# Patient Record
Sex: Female | Born: 1935 | Race: White | Hispanic: No | Smoking: Never smoker
Health system: Southern US, Community
[De-identification: ages and names within clinical notes are randomized; demographics above are authoritative.]

## PROBLEM LIST (undated history)

## (undated) DIAGNOSIS — E663 Overweight: Secondary | ICD-10-CM

## (undated) DIAGNOSIS — I495 Sick sinus syndrome: Secondary | ICD-10-CM

## (undated) DIAGNOSIS — M1990 Unspecified osteoarthritis, unspecified site: Secondary | ICD-10-CM

## (undated) DIAGNOSIS — I5032 Chronic diastolic (congestive) heart failure: Secondary | ICD-10-CM

## (undated) DIAGNOSIS — K5730 Diverticulosis of large intestine without perforation or abscess without bleeding: Secondary | ICD-10-CM

## (undated) DIAGNOSIS — I4819 Other persistent atrial fibrillation: Secondary | ICD-10-CM

## (undated) DIAGNOSIS — D126 Benign neoplasm of colon, unspecified: Secondary | ICD-10-CM

## (undated) DIAGNOSIS — M949 Disorder of cartilage, unspecified: Secondary | ICD-10-CM

## (undated) DIAGNOSIS — F329 Major depressive disorder, single episode, unspecified: Secondary | ICD-10-CM

## (undated) DIAGNOSIS — I872 Venous insufficiency (chronic) (peripheral): Secondary | ICD-10-CM

## (undated) DIAGNOSIS — N289 Disorder of kidney and ureter, unspecified: Secondary | ICD-10-CM

## (undated) DIAGNOSIS — E785 Hyperlipidemia, unspecified: Secondary | ICD-10-CM

## (undated) DIAGNOSIS — M899 Disorder of bone, unspecified: Secondary | ICD-10-CM

## (undated) DIAGNOSIS — E039 Hypothyroidism, unspecified: Secondary | ICD-10-CM

## (undated) DIAGNOSIS — I4892 Unspecified atrial flutter: Secondary | ICD-10-CM

## (undated) DIAGNOSIS — F3289 Other specified depressive episodes: Secondary | ICD-10-CM

## (undated) HISTORY — DX: Hyperlipidemia, unspecified: E78.5

## (undated) HISTORY — DX: Diverticulosis of large intestine without perforation or abscess without bleeding: K57.30

## (undated) HISTORY — DX: Disorder of bone, unspecified: M89.9

## (undated) HISTORY — DX: Benign neoplasm of colon, unspecified: D12.6

## (undated) HISTORY — DX: Overweight: E66.3

## (undated) HISTORY — DX: Hypothyroidism, unspecified: E03.9

## (undated) HISTORY — DX: Unspecified atrial flutter: I48.92

## (undated) HISTORY — DX: Disorder of cartilage, unspecified: M94.9

## (undated) HISTORY — DX: Major depressive disorder, single episode, unspecified: F32.9

## (undated) HISTORY — DX: Unspecified osteoarthritis, unspecified site: M19.90

## (undated) HISTORY — DX: Sick sinus syndrome: I49.5

## (undated) HISTORY — DX: Other persistent atrial fibrillation: I48.19

## (undated) HISTORY — PX: TONSILLECTOMY: SUR1361

## (undated) HISTORY — DX: Other specified depressive episodes: F32.89

---

## 1998-01-18 ENCOUNTER — Other Ambulatory Visit: Admission: RE | Admit: 1998-01-18 | Discharge: 1998-01-18 | Payer: Self-pay | Admitting: Obstetrics and Gynecology

## 1999-11-13 ENCOUNTER — Other Ambulatory Visit: Admission: RE | Admit: 1999-11-13 | Discharge: 1999-11-13 | Payer: Self-pay | Admitting: *Deleted

## 2001-02-25 ENCOUNTER — Other Ambulatory Visit: Admission: RE | Admit: 2001-02-25 | Discharge: 2001-02-25 | Payer: Self-pay | Admitting: *Deleted

## 2001-10-17 ENCOUNTER — Emergency Department (HOSPITAL_COMMUNITY): Admission: EM | Admit: 2001-10-17 | Discharge: 2001-10-17 | Payer: Self-pay

## 2001-10-17 ENCOUNTER — Encounter: Payer: Self-pay | Admitting: Emergency Medicine

## 2002-01-12 ENCOUNTER — Encounter: Payer: Self-pay | Admitting: Pulmonary Disease

## 2002-01-12 ENCOUNTER — Inpatient Hospital Stay (HOSPITAL_COMMUNITY): Admission: EM | Admit: 2002-01-12 | Discharge: 2002-01-13 | Payer: Self-pay | Admitting: Emergency Medicine

## 2002-01-13 ENCOUNTER — Inpatient Hospital Stay (HOSPITAL_COMMUNITY): Admission: EM | Admit: 2002-01-13 | Discharge: 2002-01-25 | Payer: Self-pay | Admitting: Psychiatry

## 2002-01-15 ENCOUNTER — Encounter: Payer: Self-pay | Admitting: Psychiatry

## 2002-01-18 ENCOUNTER — Encounter: Payer: Self-pay | Admitting: Psychiatry

## 2002-01-21 ENCOUNTER — Encounter: Payer: Self-pay | Admitting: *Deleted

## 2002-02-26 ENCOUNTER — Ambulatory Visit (HOSPITAL_COMMUNITY): Admission: RE | Admit: 2002-02-26 | Discharge: 2002-02-26 | Payer: Self-pay | Admitting: Neurology

## 2005-02-08 ENCOUNTER — Ambulatory Visit: Payer: Self-pay | Admitting: Pulmonary Disease

## 2005-03-06 ENCOUNTER — Ambulatory Visit: Payer: Self-pay | Admitting: Pulmonary Disease

## 2006-03-10 ENCOUNTER — Ambulatory Visit: Payer: Self-pay | Admitting: Pulmonary Disease

## 2006-08-04 ENCOUNTER — Ambulatory Visit: Payer: Self-pay | Admitting: Pulmonary Disease

## 2006-08-04 LAB — CONVERTED CEMR LAB
ALT: 23 units/L (ref 0–40)
AST: 23 units/L (ref 0–37)
Albumin: 3.6 g/dL (ref 3.5–5.2)
Alkaline Phosphatase: 48 units/L (ref 39–117)
BUN: 14 mg/dL (ref 6–23)
CO2: 30 meq/L (ref 19–32)
Calcium: 9.3 mg/dL (ref 8.4–10.5)
Chloride: 106 meq/L (ref 96–112)
Creatinine, Ser: 1.2 mg/dL (ref 0.4–1.2)
GFR calc non Af Amer: 47 mL/min
Glomerular Filtration Rate, Af Am: 57 mL/min/{1.73_m2}
Glucose, Bld: 107 mg/dL — ABNORMAL HIGH (ref 70–99)
Potassium: 4.7 meq/L (ref 3.5–5.1)
Sodium: 142 meq/L (ref 135–145)
Total Bilirubin: 0.6 mg/dL (ref 0.3–1.2)
Total Protein: 6.8 g/dL (ref 6.0–8.3)

## 2006-08-18 ENCOUNTER — Ambulatory Visit: Payer: Self-pay | Admitting: Pulmonary Disease

## 2006-08-18 LAB — CONVERTED CEMR LAB
Chol/HDL Ratio, serum: 2.7
Cholesterol: 140 mg/dL (ref 0–200)
HDL: 51.6 mg/dL (ref >39.0)
LDL Cholesterol: 70 mg/dL (ref 0–99)
Triglyceride fasting, serum: 90 mg/dL (ref 0–149)
VLDL: 18 mg/dL (ref 0–40)

## 2006-12-01 ENCOUNTER — Ambulatory Visit: Payer: Self-pay | Admitting: Pulmonary Disease

## 2006-12-22 ENCOUNTER — Ambulatory Visit: Payer: Self-pay | Admitting: Pulmonary Disease

## 2007-01-09 ENCOUNTER — Ambulatory Visit: Payer: Self-pay | Admitting: Pulmonary Disease

## 2007-01-09 LAB — CONVERTED CEMR LAB
ALT: 26 units/L (ref 0–40)
AST: 21 units/L (ref 0–37)
Albumin: 3.6 g/dL (ref 3.5–5.2)
Alkaline Phosphatase: 64 units/L (ref 39–117)
BUN: 19 mg/dL (ref 6–23)
Basophils Absolute: 0.1 10*3/uL (ref 0.0–0.1)
Basophils Relative: 0.9 % (ref 0.0–1.0)
Bilirubin Urine: NEGATIVE
Bilirubin, Direct: 0.1 mg/dL (ref 0.0–0.3)
CO2: 30 meq/L (ref 19–32)
Calcium: 9.5 mg/dL (ref 8.4–10.5)
Chloride: 106 meq/L (ref 96–112)
Creatinine, Ser: 1.2 mg/dL (ref 0.4–1.2)
Crystals: NEGATIVE
Eosinophils Absolute: 0.1 10*3/uL (ref 0.0–0.6)
Eosinophils Relative: 1.2 % (ref 0.0–5.0)
Free T4: 0.7 ng/dL (ref 0.6–1.6)
GFR calc Af Amer: 57 mL/min
GFR calc non Af Amer: 47 mL/min
Glucose, Bld: 143 mg/dL — ABNORMAL HIGH (ref 70–99)
HCT: 43 % (ref 36.0–46.0)
Hemoglobin, Urine: NEGATIVE
Hemoglobin: 14.7 g/dL (ref 12.0–15.0)
Ketones, ur: NEGATIVE mg/dL
Lymphocytes Relative: 24.4 % (ref 12.0–46.0)
MCHC: 34.1 g/dL (ref 30.0–36.0)
MCV: 87.7 fL (ref 78.0–100.0)
Monocytes Absolute: 0.3 10*3/uL (ref 0.2–0.7)
Monocytes Relative: 4.7 % (ref 3.0–11.0)
Mucus, UA: NEGATIVE
Neutro Abs: 4.4 10*3/uL (ref 1.4–7.7)
Neutrophils Relative %: 68.8 % (ref 43.0–77.0)
Nitrite: NEGATIVE
Platelets: 239 10*3/uL (ref 150–400)
Potassium: 4.3 meq/L (ref 3.5–5.1)
RBC / HPF: NONE SEEN
RBC: 4.91 M/uL (ref 3.87–5.11)
RDW: 14.3 % (ref 11.5–14.6)
Sodium: 141 meq/L (ref 135–145)
Specific Gravity, Urine: 1.01 (ref 1.000–1.03)
T3, Free: 2.6 pg/mL (ref 2.3–4.2)
T4, Total: 6.4 ug/dL (ref 5.0–12.5)
Total Bilirubin: 0.7 mg/dL (ref 0.3–1.2)
Total Protein, Urine: NEGATIVE mg/dL
Total Protein: 6.4 g/dL (ref 6.0–8.3)
Urine Glucose: NEGATIVE mg/dL
Urobilinogen, UA: 0.2 (ref 0.0–1.0)
WBC: 6.5 10*3/uL (ref 4.5–10.5)
pH: 7 (ref 5.0–8.0)

## 2007-01-22 ENCOUNTER — Inpatient Hospital Stay (HOSPITAL_COMMUNITY): Admission: AD | Admit: 2007-01-22 | Discharge: 2007-01-24 | Payer: Self-pay | Admitting: Psychiatry

## 2007-01-22 ENCOUNTER — Ambulatory Visit: Payer: Self-pay | Admitting: Psychiatry

## 2007-04-21 ENCOUNTER — Ambulatory Visit: Payer: Self-pay | Admitting: Pulmonary Disease

## 2007-04-21 LAB — CONVERTED CEMR LAB
ALT: 31 units/L (ref 0–35)
AST: 30 units/L (ref 0–37)
Albumin: 3.7 g/dL (ref 3.5–5.2)
Alkaline Phosphatase: 59 units/L (ref 39–117)
BUN: 15 mg/dL (ref 6–23)
Basophils Absolute: 0 10*3/uL (ref 0.0–0.1)
Basophils Relative: 0.5 % (ref 0.0–1.0)
Bilirubin, Direct: 0.1 mg/dL (ref 0.0–0.3)
CO2: 28 meq/L (ref 19–32)
Calcium: 9.8 mg/dL (ref 8.4–10.5)
Chloride: 103 meq/L (ref 96–112)
Creatinine, Ser: 1.3 mg/dL — ABNORMAL HIGH (ref 0.4–1.2)
Eosinophils Absolute: 0.1 10*3/uL (ref 0.0–0.6)
Eosinophils Relative: 2.1 % (ref 0.0–5.0)
GFR calc Af Amer: 51 mL/min
GFR calc non Af Amer: 43 mL/min
Glucose, Bld: 97 mg/dL (ref 70–99)
HCT: 39.7 % (ref 36.0–46.0)
Hemoglobin: 13.7 g/dL (ref 12.0–15.0)
Lymphocytes Relative: 37.6 % (ref 12.0–46.0)
MCHC: 34.6 g/dL (ref 30.0–36.0)
MCV: 90.9 fL (ref 78.0–100.0)
Monocytes Absolute: 0.4 10*3/uL (ref 0.2–0.7)
Monocytes Relative: 7.2 % (ref 3.0–11.0)
Neutro Abs: 2.6 10*3/uL (ref 1.4–7.7)
Neutrophils Relative %: 52.6 % (ref 43.0–77.0)
Platelets: 241 10*3/uL (ref 150–400)
Potassium: 4.4 meq/L (ref 3.5–5.1)
RBC: 4.37 M/uL (ref 3.87–5.11)
RDW: 13.1 % (ref 11.5–14.6)
Sodium: 137 meq/L (ref 135–145)
TSH: 3.56 microintl units/mL (ref 0.35–5.50)
Total Bilirubin: 0.7 mg/dL (ref 0.3–1.2)
Total Protein: 6.4 g/dL (ref 6.0–8.3)
WBC: 4.9 10*3/uL (ref 4.5–10.5)

## 2007-06-12 ENCOUNTER — Ambulatory Visit: Payer: Self-pay | Admitting: Pulmonary Disease

## 2007-12-30 ENCOUNTER — Telehealth (INDEPENDENT_AMBULATORY_CARE_PROVIDER_SITE_OTHER): Payer: Self-pay | Admitting: *Deleted

## 2007-12-31 ENCOUNTER — Ambulatory Visit: Payer: Self-pay | Admitting: Pulmonary Disease

## 2007-12-31 DIAGNOSIS — E039 Hypothyroidism, unspecified: Secondary | ICD-10-CM | POA: Insufficient documentation

## 2007-12-31 DIAGNOSIS — E785 Hyperlipidemia, unspecified: Secondary | ICD-10-CM

## 2007-12-31 DIAGNOSIS — F32A Depression, unspecified: Secondary | ICD-10-CM | POA: Insufficient documentation

## 2007-12-31 DIAGNOSIS — F329 Major depressive disorder, single episode, unspecified: Secondary | ICD-10-CM

## 2007-12-31 DIAGNOSIS — M1990 Unspecified osteoarthritis, unspecified site: Secondary | ICD-10-CM | POA: Insufficient documentation

## 2007-12-31 DIAGNOSIS — J209 Acute bronchitis, unspecified: Secondary | ICD-10-CM

## 2008-01-23 ENCOUNTER — Encounter: Payer: Self-pay | Admitting: Pulmonary Disease

## 2008-02-02 ENCOUNTER — Ambulatory Visit: Payer: Self-pay | Admitting: Pulmonary Disease

## 2008-02-02 DIAGNOSIS — I872 Venous insufficiency (chronic) (peripheral): Secondary | ICD-10-CM | POA: Insufficient documentation

## 2008-02-02 DIAGNOSIS — I679 Cerebrovascular disease, unspecified: Secondary | ICD-10-CM | POA: Insufficient documentation

## 2008-02-02 DIAGNOSIS — M949 Disorder of cartilage, unspecified: Secondary | ICD-10-CM

## 2008-02-02 DIAGNOSIS — E663 Overweight: Secondary | ICD-10-CM | POA: Insufficient documentation

## 2008-02-02 DIAGNOSIS — M899 Disorder of bone, unspecified: Secondary | ICD-10-CM | POA: Insufficient documentation

## 2008-02-07 LAB — CONVERTED CEMR LAB
AST: 21 units/L (ref 0–37)
Albumin: 3.8 g/dL (ref 3.5–5.2)
Alkaline Phosphatase: 61 units/L (ref 39–117)
BUN: 17 mg/dL (ref 6–23)
Bilirubin, Direct: 0.1 mg/dL (ref 0.0–0.3)
Chloride: 109 meq/L (ref 96–112)
Eosinophils Absolute: 0.1 10*3/uL (ref 0.0–0.7)
Eosinophils Relative: 1.5 % (ref 0.0–5.0)
GFR calc non Af Amer: 42 mL/min
HDL: 50.5 mg/dL (ref >39.0)
MCV: 92.1 fL (ref 78.0–100.0)
Monocytes Relative: 5.9 % (ref 3.0–12.0)
Neutrophils Relative %: 49.2 % (ref 43.0–77.0)
Platelets: 211 10*3/uL (ref 150–400)
Potassium: 4.5 meq/L (ref 3.5–5.1)
Sodium: 143 meq/L (ref 135–145)
Total CHOL/HDL Ratio: 3.6
VLDL: 21 mg/dL (ref 0–40)
WBC: 5.5 10*3/uL (ref 4.5–10.5)

## 2008-02-10 ENCOUNTER — Telehealth (INDEPENDENT_AMBULATORY_CARE_PROVIDER_SITE_OTHER): Payer: Self-pay | Admitting: *Deleted

## 2008-08-22 ENCOUNTER — Ambulatory Visit: Payer: Self-pay | Admitting: Pulmonary Disease

## 2009-06-01 ENCOUNTER — Ambulatory Visit: Payer: Self-pay | Admitting: Pulmonary Disease

## 2009-06-01 DIAGNOSIS — K5900 Constipation, unspecified: Secondary | ICD-10-CM | POA: Insufficient documentation

## 2009-06-01 LAB — CONVERTED CEMR LAB
ALT: 23 units/L (ref 0–35)
Alkaline Phosphatase: 63 units/L (ref 39–117)
BUN: 19 mg/dL (ref 6–23)
Basophils Relative: 1.1 % (ref 0.0–3.0)
Bilirubin, Direct: 0.1 mg/dL (ref 0.0–0.3)
Calcium: 9.5 mg/dL (ref 8.4–10.5)
Chloride: 107 meq/L (ref 96–112)
Creatinine, Ser: 1.3 mg/dL — ABNORMAL HIGH (ref 0.4–1.2)
Eosinophils Relative: 1.8 % (ref 0.0–5.0)
GFR calc non Af Amer: 42.29 mL/min (ref >60)
HDL: 62.7 mg/dL (ref >39.00)
LDL Cholesterol: 86 mg/dL (ref 0–99)
Lymphocytes Relative: 34.8 % (ref 12.0–46.0)
Monocytes Relative: 8.2 % (ref 3.0–12.0)
Neutrophils Relative %: 54.1 % (ref 43.0–77.0)
Platelets: 172 10*3/uL (ref 150.0–400.0)
RBC: 4.69 M/uL (ref 3.87–5.11)
Total Bilirubin: 0.7 mg/dL (ref 0.3–1.2)
Total CHOL/HDL Ratio: 3
Total Protein: 6.8 g/dL (ref 6.0–8.3)
Triglycerides: 129 mg/dL (ref 0.0–149.0)
VLDL: 25.8 mg/dL (ref 0.0–40.0)
WBC: 4.9 10*3/uL (ref 4.5–10.5)

## 2009-06-14 ENCOUNTER — Telehealth: Payer: Self-pay | Admitting: Pulmonary Disease

## 2009-06-20 ENCOUNTER — Ambulatory Visit: Payer: Self-pay | Admitting: Gastroenterology

## 2009-07-18 ENCOUNTER — Encounter: Payer: Self-pay | Admitting: Gastroenterology

## 2009-07-18 ENCOUNTER — Ambulatory Visit: Payer: Self-pay | Admitting: Gastroenterology

## 2009-07-19 ENCOUNTER — Encounter: Payer: Self-pay | Admitting: Gastroenterology

## 2009-10-13 ENCOUNTER — Telehealth: Payer: Self-pay | Admitting: Pulmonary Disease

## 2009-11-24 ENCOUNTER — Encounter: Payer: Self-pay | Admitting: Pulmonary Disease

## 2009-12-08 ENCOUNTER — Ambulatory Visit: Payer: Self-pay | Admitting: Pulmonary Disease

## 2009-12-08 LAB — CONVERTED CEMR LAB
AST: 23 units/L (ref 0–37)
Albumin: 3.8 g/dL (ref 3.5–5.2)
Alkaline Phosphatase: 61 units/L (ref 39–117)
BUN: 18 mg/dL (ref 6–23)
CO2: 27 meq/L (ref 19–32)
Calcium: 9.7 mg/dL (ref 8.4–10.5)
GFR calc non Af Amer: 42.23 mL/min (ref >60)
Glucose, Bld: 95 mg/dL (ref 70–99)
Sodium: 140 meq/L (ref 135–145)
TSH: 3.13 microintl units/mL (ref 0.35–5.50)
Total Protein: 6.8 g/dL (ref 6.0–8.3)

## 2010-08-03 ENCOUNTER — Ambulatory Visit: Payer: Self-pay | Admitting: Pulmonary Disease

## 2010-08-03 ENCOUNTER — Encounter: Payer: Self-pay | Admitting: Pulmonary Disease

## 2010-08-03 DIAGNOSIS — I4891 Unspecified atrial fibrillation: Secondary | ICD-10-CM | POA: Insufficient documentation

## 2010-08-03 DIAGNOSIS — D126 Benign neoplasm of colon, unspecified: Secondary | ICD-10-CM | POA: Insufficient documentation

## 2010-08-03 DIAGNOSIS — K5730 Diverticulosis of large intestine without perforation or abscess without bleeding: Secondary | ICD-10-CM | POA: Insufficient documentation

## 2010-08-05 LAB — CONVERTED CEMR LAB
AST: 19 units/L (ref 0–37)
Albumin: 3.8 g/dL (ref 3.5–5.2)
Alkaline Phosphatase: 73 units/L (ref 39–117)
Basophils Absolute: 0 10*3/uL (ref 0.0–0.1)
Bilirubin, Direct: 0.1 mg/dL (ref 0.0–0.3)
CO2: 29 meq/L (ref 19–32)
Calcium: 9.7 mg/dL (ref 8.4–10.5)
Creatinine, Ser: 1.4 mg/dL — ABNORMAL HIGH (ref 0.4–1.2)
Eosinophils Absolute: 0.1 10*3/uL (ref 0.0–0.7)
GFR calc non Af Amer: 40.02 mL/min (ref >60)
Glucose, Bld: 99 mg/dL (ref 70–99)
Lymphocytes Relative: 38.6 % (ref 12.0–46.0)
MCHC: 33.8 g/dL (ref 30.0–36.0)
Monocytes Relative: 7.5 % (ref 3.0–12.0)
Neutrophils Relative %: 52 % (ref 43.0–77.0)
Platelets: 220 10*3/uL (ref 150.0–400.0)
RDW: 13.9 % (ref 11.5–14.6)
Sodium: 140 meq/L (ref 135–145)
Total Bilirubin: 0.4 mg/dL (ref 0.3–1.2)

## 2010-08-06 ENCOUNTER — Ambulatory Visit: Payer: Self-pay | Admitting: Internal Medicine

## 2010-08-08 ENCOUNTER — Encounter: Payer: Self-pay | Admitting: Internal Medicine

## 2010-09-07 ENCOUNTER — Ambulatory Visit: Payer: Self-pay

## 2010-09-07 ENCOUNTER — Encounter: Payer: Self-pay | Admitting: Internal Medicine

## 2010-09-07 ENCOUNTER — Ambulatory Visit (HOSPITAL_COMMUNITY)
Admission: RE | Admit: 2010-09-07 | Discharge: 2010-09-07 | Payer: Self-pay | Source: Home / Self Care | Attending: Internal Medicine | Admitting: Internal Medicine

## 2010-09-10 ENCOUNTER — Telehealth: Payer: Self-pay | Admitting: Pulmonary Disease

## 2010-09-21 ENCOUNTER — Ambulatory Visit: Payer: Self-pay | Admitting: Internal Medicine

## 2010-10-05 ENCOUNTER — Emergency Department (HOSPITAL_COMMUNITY)
Admission: EM | Admit: 2010-10-05 | Discharge: 2010-10-05 | Payer: Self-pay | Source: Home / Self Care | Admitting: Emergency Medicine

## 2010-10-30 NOTE — Progress Notes (Signed)
Summary: COUGH/ CONGESTION  Phone Note Call from Patient Call back at [phone]   Caller: Daughter Call For: Sonya Kaufman Summary of Call: COUGH/ CONGESTION T[place] Initial call taken by: Gustavus Bryant,  October 13, 2009 9:40 AM  Follow-up for Phone Call        called, spoke with pt.  P c/o cough with yellowish green mucus, chest congestion, rattling, and wheezing x3 wks.  denies fever and chest tightness.  Requesting abx to be sent in.  Will forward to SN-please advise.   NKDA [facility] Follow-up by: Raymondo Band RN,  October 13, 2009 1:44 PM  Additional Follow-up for Phone Call Additional follow up Details #1::        per SN---ok for pt to have zpak and medrol dosepak  these have been sent to pharmacy and i called and spoke with pt    New/Updated Medications: ZITHROMAX Z-PAK 250 MG TABS (AZITHROMYCIN) take as directed MEDROL (PAK) 4 MG TABS (METHYLPREDNISOLONE) take as directed Prescriptions: MEDROL (PAK) 4 MG TABS (METHYLPREDNISOLONE) take as directed  #1 x 0   Entered by:   [name] CMA   Authorized by:   Noralee Space MD   Signed by:   [name] CMA on 10/13/2009   Method used:   Electronically to        [facility]  [phone]* (retail)       [address]       [place], [place]  [address]       Ph: [phone] or [phone]       Fax: [phone]   RxIDDP:[id number] ZITHROMAX Z-PAK 250 MG TABS (AZITHROMYCIN) take as directed  #1 pak x 0   Entered by:   [name] CMA   Authorized by:   Noralee Space MD   Signed by:   [name] CMA on 10/13/2009   Method used:   Electronically to        [facility]  [phone]* (retail)       [address]       [place], [place]  [address]       Ph: [phone] or [phone]       Fax: [phone]   RxID:   [id number]

## 2010-10-30 NOTE — Assessment & Plan Note (Signed)
Summary: nep/ new onset of afib/ asap. pt has secure horizone. gd  Medications Added FISH OIL 1000 MG CAPS (OMEGA-3 FATTY ACIDS) take 2 cap sby mouth once daily... METOPROLOL SUCCINATE 25 MG XR24H-TAB (METOPROLOL SUCCINATE) take 2 tab by mouth once daily... PRADAXA 150 MG CAPS (DABIGATRAN ETEXILATE MESYLATE) one by mouth two times a day METOPROLOL SUCCINATE 50 MG XR24H-TAB (METOPROLOL SUCCINATE) one by mouth daily      Allergies Added: NKDA  Visit Type:  Initial Consult Referring Provider:  Dr Lenna Gilford Primary Provider:  scott nadel,md   History of Present Illness: Ms Sonya Kaufman is a pleasant 77 yo WF with a depression, asthma, and h/o newly diagnosed atrial fibrillation who presents today for EP consultation regarding her afib.  She presented to Dr Jeannine Kitten office 08/03/10 for routine follow-up and was found to have atrial fibrillation with HR 120s.  She was started on toprol XL 25mg  daily.  She reports intermittent SOB for more than 6 months.  She is dypsneic with moderate activity.  She denies CP, palpitations, orthopnea, edema, presyncope, or syncope.  She fell 3 weeks ago while getting out of the bed.  She denies residual complications following this fall.  She did not have LOC.  She denies other falls.   Current Medications (verified): 1)  Proair Hfa 108 (90 Base) Mcg/act  Aers (Albuterol Sulfate) .... Inhale 2 Puffs Every 6 Hours As Needed For Wheezing... 2)  Aspirin 81 Mg Tabs (Aspirin) .... Take 1 Tablet By Mouth Once A Day 3)  Simvastatin 20 Mg  Tabs (Simvastatin) .... Take 1 Tab By Mouth At Bedtime.Marland KitchenMarland Kitchen 4)  Fish Oil 1000 Mg Caps (Omega-3 Fatty Acids) .... Take 2 Cap Sby Mouth Once Daily.Marland KitchenMarland Kitchen 5)  Synthroid 125 Mcg Tabs (Levothyroxine Sodium) .... Take 1 Tab By Mouth Once Daily.Marland KitchenMarland Kitchen 6)  Miralax   Powd (Polyethylene Glycol 3350) .... Take 1 Capful Mixed in Water Two Times A Day... 7)  Wellbutrin Xl 150 Mg  Tb24 (Bupropion Hcl) .... As Directed By Drsteiner... 8)  Lexapro 20 Mg  Tabs  (Escitalopram Oxalate) .... As Directed By Drsteiner... 9)  Zyprexa 2.5 Mg  Tabs (Olanzapine) .... As Directed By Drsteiner... 10)  Lithobid 300 Mg Cr-Tabs (Lithium Carbonate) .... Take 2 Capsules By Mouth Once Daily 11)  Deplin 15 Mg Tabs (L-Methylfolate) .... Take 1 Tab Daily As Directed By Drsteiner... 12)  Multivitamins  Tabs (Multiple Vitamin) .... Take 1 Tablet By Mouth Once A Day 13)  Vitamin D 1000 Unit Tabs (Cholecalciferol) .... Take 1 Cap By Mouth Once Daily... 14)  Metoprolol Succinate 25 Mg Xr24h-Tab (Metoprolol Succinate) .... Take 1 Tab By Mouth Once Daily...  Allergies (verified): No Known Drug Allergies  Past History:  Past Medical History: ASTHMATIC BRONCHITIS, ACUTE (ICD-466.0) ATRIAL FIBRILLATION dx 08/03/10 CEREBROVASCULAR DISEASE (ICD-437.9) VENOUS INSUFFICIENCY (ICD-459.81) HYPERLIPIDEMIA (ICD-272.4) HYPOTHYROIDISM (ICD-244.9) OVERWEIGHT (ICD-278.02) DIVERTICULOSIS OF COLON (ICD-562.10) COLONIC POLYPS (ICD-211.3) OSTEOARTHRITIS (ICD-715.90) OSTEOPENIA (ICD-733.90) DEPRESSION (ICD-311)  Past Surgical History: tonsellectomy  Family History: Reviewed history from 08/03/2010 and no changes required. mother deceased age 56  from CHF  hx of arthritis father deceased age 25 heart problems 1 sibling alive age 57 with heart problems  Social History: married and lives in [place] with spouse 4 children never smoked caffeine use  1 daily no alcohol use  Review of Systems       All systems are reviewed and negative except as listed in the HPI.   Vital Signs:  Patient profile:   77 year old female Height:  68 inches Weight:      208 pounds BMI:     31.74 Pulse rate:   104 / minute BP sitting:   142 / 78  (left arm)  Vitals Entered By: Margaretmary Bayley CMA (August 06, 2010 12:10 PM)  Physical Exam  General:  Well developed, well nourished, in no acute distress. Head:  normocephalic and atraumatic Eyes:  PERRLA/EOM intact; conjunctiva and lids  normal. Mouth:  Teeth, gums and palate normal. Oral mucosa normal. Neck:  supple Lungs:  Clear bilaterally to auscultation and percussion. Heart:  iRRR, no m/r/g Abdomen:  Bowel sounds positive; abdomen soft and non-tender without masses, organomegaly, or hernias noted. No hepatosplenomegaly. Msk:  Back normal, normal gait. Muscle strength and tone normal. Pulses:  pulses normal in all 4 extremities Extremities:  No clubbing or cyanosis. Neurologic:  Alert and oriented x 3. Skin:  Intact without lesions or rashes. Psych:  Normal affect.   EKG  Procedure date:  08/03/2010  Findings:      afib,  V rate 114 bpm, Qtc 422 otherwise normal ekg  Impression & Recommendations:  Problem # 1:  ATRIAL FIBRILLATION (ICD-427.31) The patient presents today for EP consultation regarding mildly symptomatic recently diagnosed persistent atrial fibrillation.  She has been initiated on metoprolol for rate control, however her ventricular rates remains elevated.  Her CHADS2 score is 1 (age >12).  I have therefore recommended chronic anticoagulation with either coumadin or pradaxa.  She states that making frequent visits for INR checks would be difficult and therefore prefers pradaxa.  Her CrCl is 50.  We will therefore initiate pradaxa 150mg  two times a day at this time. In addition, I will increase metoprolol to 50mg  daily.  I will see her back in several weeks to further assess rate control. Recent TFTs are normal.  We will obtain an echocardiogram in the interim.  Other Orders: Echocardiogram (Echo)  Patient Instructions: 1)  Your physician recommends that you schedule a follow-up appointment in: 4 weeks with Dr Rayann Heman 2)  Your physician has recommended you make the following change in your medication: increase Metoprolol to 50mg   a day, and start Pradaxa 150mg  two times a day 3)  Your physician has requested that you have an echocardiogram.  Echocardiography is a painless test that uses sound waves  to create images of your heart. It provides your doctor with information about the size and shape of your heart and how well your heart's chambers and valves are working.  This procedure takes approximately one hour. There are no restrictions for this procedure.Prior to appointment in 4 weeks Prescriptions: METOPROLOL SUCCINATE 50 MG XR24H-TAB (METOPROLOL SUCCINATE) one by mouth daily  #30 x 11   Entered by:   Janan Halter, RN, BSN   Authorized by:   Thompson Grayer, MD   Signed by:   Janan Halter, RN, BSN on 08/06/2010   Method used:   Electronically to        [facility]  [phone]* (retail)       [address]       [place], [place]  [address]       Ph: [phone] or YT:[phone]       Fax: [phone]   RxIDOR:[id number] PRADAXA 150 MG CAPS (DABIGATRAN ETEXILATE MESYLATE) one by mouth two times a day  #60 x 6   Entered by:   Janan Halter, RN, BSN   Authorized by:  Thompson Grayer, MD   Signed by:   Janan Halter, RN, BSN on 08/06/2010   Method used:   Electronically to        [facility]  [phone]* (retail)       [address]       [place], [place]  [address]       Ph: [phone] or YT:[phone]       Fax: [phone]   RxID:   [id number]

## 2010-10-30 NOTE — Assessment & Plan Note (Signed)
Summary: 6 mos rov////ea   CC:  8 month ROV & review of mult medical problems....  History of Present Illness: 77 y/o WF here for a follow up visit... she has mult med problems as noted below... she is followed by DrSteiner for Psychiatry w/ severe depression on 5 diff meds as below...   ~  Sep10:  states she is doing well overall- continues on Wellbutrin, Lexapro, Zyprexa from DrSteiner and doing satis... she notes some constipation despite Miralax & has to use Exlax- she had Bantam but never had a colonoscopy... we discussed Rx w/ Miralax Bid + Senakot-S 2Qhs, and will refer to GI for colon... she also notes some intermittent right hip pain & we will try Mobic for Prn use... due for f/u Fasting labs today.   ~  Mar11:  13mo f/u visit- she's had some worsening depression & meds adjusted per DrSteiner w/ Lithium incr to Bid & Deplin 15mg /d added... she would like full thyroid function tests to be sure that the current dose of Synthroid (133mcg/d) is adeq replacement Rx for her... otherw feeling satis & stable on current meds.  **Note: TFT's WNL but low-end & we decided to incr Synthroid to 128mcg/d...   ~  August 03, 2010:  rourine ROV feeling OK & w/o any new complaints or concerns... exam shows irreg irreg heart w/ AFib on EKG, rate 106 (80-120 range)... daughter indicates strong FamHx AFib in several relatives but Sonya Kaufman hasn't had this before... Thyroid dose was incr to 135mcg/d last OV & we will recheck labs, check CXR, & refer to Cards for 2DEcho & Rx... for now start low dose BBlocker Rx.    Current Problem List:  ASTHMATIC BRONCHITIS, ACUTE (ICD-466.0) - on PROAIR as needed... she is a non-smoker... denies cough, sputum, hemoptysis, worsening dyspnea,  wheezing, chest pains, snoring, daytime hypersomnolence, etc...   ~  CXR 11/11 showed cardiomeg, clear lungs, NAD...  ATRIAL FIBRILLATION (ICD-427.31) - new onset noted 11/11 OV & pt asymptomatic w/o CP, palpit, dizzy, SOB,  etc... SEE ABOVE>  CEREBROVASCULAR DISEASE (ICD-437.9) - Rx ASA 81mg /d... prev eval by DrLove in 2003... CT showed bilat sm vessel ischemic disease...   ~  9/10:  remains stable, asymptomatic, BP= 130/74  VENOUS INSUFFICIENCY (ICD-459.81) - she follows a low sodium diet and uses support hose as needed... no edema noted.  HYPERLIPIDEMIA (ICD-272.4) - on SIMVASTATIN 20mg /d + FishOil 1000mg /d...  ~  [place] 11/07 showed TChol 140, Tg 90, HDL 52, LDL 70...  ~  [place] 5/09 showed TChol 181, TG 105, HDL 51, LDL 110... rec- same med, better diet for now.  ~  FL[place] 9/10 showed TChol 174, TG 129, HDL 63, LDL 86  HYPOTHYROIDISM (ICD-244.9) - on SYNTHROID 157mcg/d...  ~  prev TSH 7/08 was 3.56  ~  labs 5/09 showed TSH= 2.06  ~  labs 9/10 showed TSH= 2.30  ~  labs 3/11 showed TSH= 3.13, FreeT3= 2.4 (2.3-4.2), FreeT4= 0.9 (0.6-1.6)... rec incr to 174mcg/d.  ~  labs 11/11 on Levoth125 showed TSH= 0.92... keep same for now.  OVERWEIGHT (ICD-278.02) - diet + exercise program discussed...  ~  weight 5/09 = 206#  ~  weight 9/10 = 206#  ~  weight 3/11 = 213#  ~  weight 11/11 = 209#  DIVERTICULOSIS OF COLON (ICD-562.10) COLONIC POLYPS (ICD-211.3) - she had routine colonoscopy 10/10 by DrStark showing several 4-35mm polyps & mild divertics- path showed one tubular adenoma & f/u planned 49yrs.  OSTEOARTHRITIS (ICD-715.90) -  hx knee pain in past, now c/o intermiitent right hip pain... she never filled Mobic Rx & uses OTC pain meds Prn.  OSTEOPENIA (ICD-733.90) - I can't find prev BMD in the system... takes Calcium, MVI, Vit D...  ~  labs 5/09 showed Vit D level = 31... rec- 1000 u Vit D OTC supplement.  DEPRESSION (ICD-311) - This has been her major problem over the years... several psyche hospitalizations and one OD admit... followed by DrSteiner every 2-3 months, on several meds & adjusted frequently... currently improved w/ WELLBUTRIN XL 150,  LEXAPRO 20,  ZYPREXA 2.5.Marland KitchenMarland Kitchen  plus LITHOBID incr to 2 tabs daily &  DEPLIN 15mg /d started recently...  ~  9/10:  she reports doing very well on her regimen- stable w/o acute problems.  ~  3/11:  reports incr depressive symptoms- meds adjusted & Deplin added by DrSteiner.  ~  11/11: reports stable on her 5 med regimen... she & husb continue to care for their 12 y/o son w/ CP.   Preventive Screening-Counseling & Management  Alcohol-Tobacco     Smoking Status: never  Allergies (verified): No Known Drug Allergies  Comments:  Nurse/Medical Assistant: The patient's medications and allergies were reviewed with the patient and were updated in the Medication and Allergy Lists.  Past History:  Past Medical History: ASTHMATIC BRONCHITIS, ACUTE (ICD-466.0) ATRIAL FIBRILLATION (ICD-427.31) CEREBROVASCULAR DISEASE (ICD-437.9) VENOUS INSUFFICIENCY (ICD-459.81) HYPERLIPIDEMIA (ICD-272.4) HYPOTHYROIDISM (ICD-244.9) OVERWEIGHT (ICD-278.02) DIVERTICULOSIS OF COLON (ICD-562.10) COLONIC POLYPS (ICD-211.3) OSTEOARTHRITIS (ICD-715.90) OSTEOPENIA (ICD-733.90) DEPRESSION (ICD-311)  Family History: Reviewed history from 12/08/2009 and no changes required. mother deceased age 73  from CHF  hx of arthritis father deceased age 64 heart problems 1 sibling alive age 49 with heart problems  Social History: Reviewed history from 12/08/2009 and no changes required. married 4 children never smoked caffeine use  1 daily no alcohol use  Review of Systems      See HPI       The patient complains of depression.  The patient denies anorexia, fever, weight loss, weight gain, vision loss, decreased hearing, hoarseness, chest pain, syncope, dyspnea on exertion, peripheral edema, prolonged cough, headaches, hemoptysis, abdominal pain, melena, hematochezia, severe indigestion/heartburn, hematuria, incontinence, muscle weakness, suspicious skin lesions, transient blindness, difficulty walking, unusual weight change, abnormal bleeding, enlarged lymph nodes, and angioedema.     Vital Signs:  Patient profile:   77 year old female Height:      68 inches Weight:      208.50 pounds BMI:     31.82 O2 Sat:      95 % on Room air Temp:     97.5 degrees F oral Pulse rate:   110 / minute BP sitting:   118 / 60  (left arm) Cuff size:   large  Vitals Entered By: [name] CMA (August 03, 2010 11:10 AM)  O2 Sat at Rest %:  95 O2 Flow:  Room air CC: 8 month ROV & review of mult medical problems... Is Patient Diabetic? No Pain Assessment Patient in pain? no      Comments meds updated today with pt   Physical Exam  Additional Exam:  WD, Overweight, 77 y/o WF in NAD... she has a flat affect... GENERAL:  Alert & oriented; pleasant & cooperative... HEENT:  [place]/AT, EOM-wnl, PERRLA, EACs-clear, TMs-wnl, NOSE-clear, THROAT-clear & wnl. NECK:  Supple w/ fairROM; no JVD; normal carotid impulses w/o bruits; no thyromegaly or nodules palpated; no lymphadenopathy. CHEST:  Clear to P & A; without wheezes/ rales/ or rhonchi  heard... HEART:  Irreg AFib w/ variable VR; without murmurs/ rubs/ or gallops detected... ABDOMEN:  Obese, soft & nontender; normal bowel sounds; no organomegaly or masses palpated... EXT: without deformities, mild arthritic changes; no varicose veins/ +venous insuffic/ no edema. NEURO:  CN's intact;  no focal neuro deficits... DERM:  No lesions noted; no rash etc...    EKG  Procedure date:  08/03/2010  Findings:      Atrial fibrillation with a controlled ventricular response rate of: 106 (80-120), and minor NSSTTWA...  SN   CXR  Procedure date:  08/03/2010  Findings:      CHEST - 2 VIEW Comparison: Chest 12/01/2006.   Findings: There is cardiomegaly but no pulmonary edema.  Lungs are clear.  No pleural effusion or pneumothorax.  No focal bony abnormality.   IMPRESSION: Cardiomegaly without acute disease.   Read By:  Ramond Dial,  M.D.   MISC. Report  Procedure date:  08/03/2010  Findings:      BMP (METABOL)    Sodium                    140 mEq/L                   135-145   Potassium                 4.7 mEq/L                   3.5-5.1   Chloride                  106 mEq/L                   96-112   Carbon Dioxide            29 mEq/L                    19-32   Glucose                   99 mg/dL                    70-99   BUN                       19 mg/dL                    6-23   Creatinine           [H]  1.4 mg/dL                   0.4-1.2   Calcium                   9.7 mg/dL                   8.4-10.5   GFR                       40.02 mL/min                >60  CBC Platelet w/Diff (CBCD)   White Cell Count          6.3 K/uL                    4.5-10.5   Red Cell Count  4.78 Mil/uL                 3.87-5.11   Hemoglobin           [H]  15.1 g/dL                   12.0-15.0   Hematocrit                44.7 %                      36.0-46.0   MCV                       93.5 fl                     78.0-100.0   Platelet Count            220.0 K/uL                  150.0-400.0   Neutrophil %              52.0 %                      43.0-77.0   Lymphocyte %              38.6 %                      12.0-46.0   Monocyte %                7.5 %                       3.0-12.0   Eosinophils%              1.6 %                       0.0-5.0   Basophils %               0.3 %                       0.0-3.0  Comments:      Hepatic/Liver Function Panel (HEPATIC)   Total Bilirubin           0.4 mg/dL                   0.3-1.2   Direct Bilirubin          0.1 mg/dL                   0.0-0.3   Alkaline Phosphatase      73 U/L                      39-117   AST                       19 U/L                      0-37   ALT                       21 U/L  0-35   Total Protein             6.3 g/dL                    6.0-8.3   Albumin                   3.8 g/dL                    3.5-5.2   TSH (TSH)   FastTSH                   0.92 uIU/mL                 0.35-5.50  B-Type Natiuretic  Peptide (BNPR)  B-Type Natriuetic Peptide                        [H]  231.7 pg/mL                 0.0-100.0   Impression & Recommendations:  Problem # 1:  ATRIAL FIBRILLATION (ICD-427.31) New AFib detected on PE today>  pt is asymptomatic w/o CP, palpit, dizzy, SOB, etc... we discussed AFib & it's treatment, decided to start METOPROLOL ER 25mg /d, and refer to [facility] for 2DEcho & Rx considerations... she knows to avoid caffeine, etc... Her updated medication list for this problem includes:    Aspirin 81 Mg Tabs (Aspirin) .Marland Kitchen... Take 1 tablet by mouth once a day    Metoprolol Succinate 25 Mg Xr24h-tab (Metoprolol succinate) .Marland Kitchen... Take 1 tab by mouth once daily...  Orders: 12 Lead EKG (12 Lead EKG) T-2 View CXR (71020TC) TLB-BMP (Basic Metabolic Panel-BMET) (99991111) TLB-CBC Platelet - w/Differential (85025-CBCD) TLB-Hepatic/Liver Function Pnl (80076-HEPATIC) TLB-TSH (Thyroid Stimulating Hormone) (84443-TSH) TLB-BNP (B-Natriuretic Peptide) (83880-BNPR) Cardiology Referral (Cardiology)  Problem # 2:  ASTHMATIC BRONCHITIS, ACUTE (ICD-466.0) No recent exac etc... may use the Proair Prn. Her updated medication list for this problem includes:    Proair Hfa 108 (90 Base) Mcg/act Aers (Albuterol sulfate) ..... Inhale 2 puffs every 6 hours as needed for wheezing...  Problem # 3:  CEREBROVASCULAR DISEASE (ICD-437.9) Kown small vessel dis & no cerebral ischemic symptoms... continue ASA daily...  Problem # 4:  HYPERLIPIDEMIA (P102836.4) She will continue the low chol/ low fat diet & exercise program... Her updated medication list for this problem includes:    Simvastatin 20 Mg Tabs (Simvastatin) .Marland Kitchen... Take 1 tab by mouth at bedtime...  Problem # 5:  HYPOTHYROIDISM (ICD-244.9) We bumped the syntroid dose from 112 to 125 last OV> TSH today shows TSH= 0.92... keep same. Her updated medication list for this problem includes:    Synthroid 125 Mcg Tabs (Levothyroxine sodium) .Marland Kitchen... Take 1  tab by mouth once daily...  Problem # 6:  OVERWEIGHT (ICD-278.02) Weight is down a few lbs to 209#... we reviewed diet, exercise, wt reduction program...  Problem # 7:  DEPRESSION (ICD-311) Severe problem followed by DrSteiner & treated w/ 5 meds currently>  Wellbutrin, Lexapro, Zyprexa, Lithium, Deplin... Her updated medication list for this problem includes:    Wellbutrin Xl 150 Mg Tb24 (Bupropion hcl) .Marland Kitchen... As directed by drsteiner...    Lexapro 20 Mg Tabs (Escitalopram oxalate) .Marland Kitchen... As directed by drsteiner...  Problem # 8:  OTHER MEDICAL PROBLEMS AS NOTED>>>  Complete Medication List: 1)  Proair Hfa 108 (90 Base) Mcg/act Aers (Albuterol sulfate) .... Inhale 2 puffs every 6 hours as needed for wheezing.Marland KitchenMarland Kitchen  2)  Aspirin 81 Mg Tabs (Aspirin) .... Take 1 tablet by mouth once a day 3)  Simvastatin 20 Mg Tabs (Simvastatin) .... Take 1 tab by mouth at bedtime.Marland KitchenMarland Kitchen 4)  Fish Oil 1000 Mg Caps (Omega-3 fatty acids) .... Take 1 cap by mouth once daily.Marland KitchenMarland Kitchen 5)  Synthroid 125 Mcg Tabs (Levothyroxine sodium) .... Take 1 tab by mouth once daily.Marland KitchenMarland Kitchen 6)  Miralax Powd (Polyethylene glycol 3350) .... Take 1 capful mixed in water two times a day... 7)  Senokot S 8.6-50 Mg Tabs (Sennosides-docusate sodium) .... Take 2 tabs by mouth at bedtime.Marland KitchenMarland Kitchen 8)  Wellbutrin Xl 150 Mg Tb24 (Bupropion hcl) .... As directed by drsteiner... 9)  Lexapro 20 Mg Tabs (Escitalopram oxalate) .... As directed by drsteiner... 10)  Zyprexa 2.5 Mg Tabs (Olanzapine) .... As directed by drsteiner... 11)  Lithobid 300 Mg Cr-tabs (Lithium carbonate) .... Take 2 capsules by mouth once daily 12)  Deplin 15 Mg Tabs (L-methylfolate) .... Take 1 tab daily as directed by drsteiner... 13)  Viactiv [id number] Mg-unt-mcg Chew (Calcium-vitamin d-vitamin k) .... Take 1-2 daily as calcium supplement... 14)  Multivitamins Tabs (Multiple vitamin) .... Take 1 tablet by mouth once a day 15)  Vitamin D 1000 Unit Tabs (Cholecalciferol) .... Take 1 cap by mouth  once daily... 16)  Metoprolol Succinate 25 Mg Xr24h-tab (Metoprolol succinate) .... Take 1 tab by mouth once daily...  Patient Instructions: 1)  Today we updated your med list- see below.... 2)  Continue your current meds the same... 3)  We added Metoprolol ER 25mg /d for your AFib.Marland KitchenMarland Kitchen 4)  We will arrange for a Cardiac electrophysiology eval for the new atrial fibrillation.Marland KitchenMarland Kitchen 5)  Today we also did your follow up CXR & blood work... please call the "phone tree" in a few days for your lab results.Marland KitchenMarland Kitchen 6)  Call for any questions.Marland KitchenMarland Kitchen 7)  Please schedule a follow-up appointment in 6 months, sooner as needed... Prescriptions: METOPROLOL SUCCINATE 25 MG XR24H-TAB (METOPROLOL SUCCINATE) take 1 tab by mouth once daily...  #30 x 6   Entered and Authorized by:   Noralee Space MD   Signed by:   Noralee Space MD on 08/03/2010   Method used:   Print then Give to Patient   RxID:   [id number]    Immunization History:  Influenza Immunization History:    Influenza:  historical (07/31/2010)

## 2010-10-30 NOTE — Letter (Signed)
Summary: [facility] By: Bubba Hales 12/08/2009 08:59:29  _____________________________________________________________________  External Attachment:    Type:   Image     Comment:   External Document

## 2010-10-30 NOTE — Assessment & Plan Note (Signed)
Summary: 6 month return/mhh   CC:  6 month follow up--problems with depression--couselor wants her to have a complete thyroid panel.  History of Present Illness: 77 y/o WF here for a follow up visit... she has mult med problems as noted below... she is followed by DrSteiner for Psychiatry w/ severe depression on several meds as below...   ~  June 01, 2009:  states she is doing well overall- continues on Wellbutrin, Lexapro, Zyprexa from DrSteiner and doing satis... she notes some constipation despite Miralax & has to use Exlax- she had [place] but never had a colonoscopy... we discussed Rx w/ Miralax Bid + Senakot-S 2Qhs, and will refer to GI for colon... she also notes some intermittent right hip pain & we will try Mobic for Prn use... due for f/u Fasting labs today.   ~  December 08, 2009:  43mo f/u visit- she's had some worsening depression & meds adjusted per DrSteiner w/ Lithium incr to Bid & Deplin 15mg /d added... she would like full thyroid function tests to be sure that the current dose of Synthroid (118mcg/d) is adeq replacement Rx for her... otherw feeling satis & stable on current meds.      **Note: TFT's WNL but low-end & we decided to incr Synthroid to 12mcg/d...    Current Problem List:  ASTHMATIC BRONCHITIS, ACUTE (ICD-466.0) - on PROAIR as needed... she is a non-smoker... denies cough, sputum, hemoptysis, worsening dyspnea,  wheezing, chest pains, snoring, daytime hypersomnolence, etc...   CEREBROVASCULAR DISEASE (ICD-437.9) - Rx ASA 81mg /d... prev eval by DrLove in 2003... CT showed bilat sm vessel ischemic disease...   ~  9/10:  stable, asymptomatic, BP= 130/74  VENOUS INSUFFICIENCY (ICD-459.81) - she follows a low sodium diet and uses support hose as needed.  HYPERLIPIDEMIA (ICD-272.4) - on SIMVASTATIN 20mg /d + FishOil 1000mg /d...  ~  [place] 11/07 showed TChol 140, Tg 90, HDL 52, LDL 70...  ~  [place] 5/09 showed TChol 181, TG 105, HDL 51, LDL 110... rec- same med, better  diet for now.  ~  [place] 9/10 showed TChol 174, TG 129, HDL 63, LDL 86  HYPOTHYROIDISM (ICD-244.9) - on SYNTHROID 12mcg/d... she's been stable but recent worsening deperession.  ~  prev TSH 7/08 was 3.56  ~  labs 5/09 showed TSH= 2.06  ~  labs 9/10 showed TSH= 2.30  ~  labs 3/11 showed TSH= 3.13, FreeT3= 2.4 (2.3-4.2), FreeT4= 0.9 (0.6-1.6)... rec incr to 143mcg/d.  OVERWEIGHT (ICD-278.02) - diet + exercise program discussed...  ~  weight 5/09 = 206#  ~  weight 9/10 = 206#  ~  weight 3/11 = 213#  OSTEOARTHRITIS (ICD-715.90) - hx knee pain in past, now c/o intermiitent right hip pain... she never filled Mobic Rx & uses OTC pain meds Prn.  OSTEOPENIA (ICD-733.90) - I can't find prev BMD in the system...  ~  labs 5/09 showed Vit D level = 31... rec- 1000 u Vit D OTC supplement.  DEPRESSION (ICD-311) - This has been her major problem over the years... several psyche hospitalizations and one OD admit... followed by DrSteiner every 2-3 months, on several meds & adjusted frequently... currently improved w/ WELLBUTRIN XL 150,  LEXAPRO 20,  ZYPREXA 2.5.Marland KitchenMarland Kitchen  plus LITHOBID incr to 2 tabs daily & DEPLIN 15mg /d started recently...  ~  9/10:  she reports doing very well on her regimen- stable w/o acute problems.  ~  3/11:  reports incr depressive symptoms- meds adjusted & Deplin added by DrSteiner.    Allergies (  verified): No Known Drug Allergies  Comments:  Nurse/Medical Assistant: The patient's medications and allergies were reviewed with the patient and were updated in the Medication and Allergy Lists.  Past History:  Past Medical History:  ASTHMATIC BRONCHITIS, ACUTE (ICD-466.0) CEREBROVASCULAR DISEASE (ICD-437.9) VENOUS INSUFFICIENCY (ICD-459.81) HYPERLIPIDEMIA (ICD-272.4) HYPOTHYROIDISM (ICD-244.9) OVERWEIGHT (ICD-278.02) OSTEOARTHRITIS (ICD-715.90) OSTEOPENIA (ICD-733.90) DEPRESSION (ICD-311)  Family History: Reviewed history and no changes required. mother deceased age 75   from CHF  hx of arthritis father deceased age 41 heart problems 1 sibling alive age 79 with heart problems  Social History: Reviewed history and no changes required. married 4 children never smoked caffeine use  1 daily no alcohol use  Review of Systems      See HPI       The patient complains of weight gain, dyspnea on exertion, and depression.  The patient denies anorexia, fever, weight loss, vision loss, decreased hearing, hoarseness, chest pain, syncope, peripheral edema, prolonged cough, headaches, hemoptysis, abdominal pain, melena, hematochezia, severe indigestion/heartburn, hematuria, incontinence, muscle weakness, suspicious skin lesions, transient blindness, difficulty walking, unusual weight change, abnormal bleeding, enlarged lymph nodes, and angioedema.    Vital Signs:  Patient profile:   77 year old female Height:      68 inches Weight:      212.50 pounds BMI:     32.43 O2 Sat:      95 % on Room air Temp:     98.4 degrees F oral Pulse rate:   63 / minute BP sitting:   134 / 62  (right arm) Cuff size:   regular  Vitals Entered By: [name] CMA (December 08, 2009 11:13 AM)  O2 Sat at Rest %:  95 O2 Flow:  Room air CC: 6 month follow up--problems with depression--couselor wants her to have a complete thyroid panel Is Patient Diabetic? No Pain Assessment Patient in pain? no      Comments meds updated today   Physical Exam  Additional Exam:  WD, Overweight, 77 y/o WF in NAD... she has a flat affect... GENERAL:  Alert & oriented; pleasant & cooperative... HEENT:  [place]/AT, EOM-wnl, PERRLA, EACs-clear, TMs-wnl, NOSE-clear, THROAT-clear & wnl. NECK:  Supple w/ fairROM; no JVD; normal carotid impulses w/o bruits; no thyromegaly or nodules palpated; no lymphadenopathy. CHEST:  Clear to P & A; without wheezes/ rales/ or rhonchi heard... HEART:  Regular Rhythm; without murmurs/ rubs/ or gallops detected... ABDOMEN:  Obese, soft & nontender; normal bowel sounds; no  organomegaly or masses palpated... EXT: without deformities, mild arthritic changes; no varicose veins/ +venous insuffic/ no edema. NEURO:  CN's intact;  no focal neuro deficits... DERM:  No lesions noted; no rash etc...    MISC. Report  Procedure date:  12/08/2009  Findings:      BMP (METABOL)   Sodium                    140 mEq/L                   135-145   Potassium                 4.3 mEq/L                   3.5-5.1   Chloride                  108 mEq/L                   96-112  Carbon Dioxide            27 mEq/L                    19-32   Glucose                   95 mg/dL                    70-99   BUN                       18 mg/dL                    6-23   Creatinine           [H]  1.3 mg/dL                   0.4-1.2   Calcium                   9.7 mg/dL                   8.4-10.5   GFR                       42.23 mL/min                >60  Hepatic/Liver Function Panel (HEPATIC)   Total Bilirubin           0.5 mg/dL                   0.3-1.2   Direct Bilirubin          0.1 mg/dL                   0.0-0.3   Alkaline Phosphatase      61 U/L                      39-117   AST                       23 U/L                      0-37   ALT                       25 U/L                      0-35   Total Protein             6.8 g/dL                    6.0-8.3   Albumin                   3.8 g/dL                    3.5-5.2  TSH (TSH)   FastTSH                   3.13 uIU/mL                 0.35-5.50  T3, Free (T3FREE)   Free T3  2.4 pg/mL                   2.3-4.2  T4, Free (FT4R)   Free T4                   0.9 ng/dL                   0.6-1.6   Impression & Recommendations:  Problem # 1:  HYPOTHYROIDISM (ICD-244.9) Pt is on Lithium for her psychiatric problems and depression per DrStiener... Full TFT's are WNL but at the lower end of normal- therefore we will tweak her thyroid dose- incr from 124mcg/d to 136mcg/d... Her updated medication list for this  problem includes:    Synthroid 125 Mcg Tabs (Levothyroxine sodium) .Marland Kitchen... Take 1 tab by mouth once daily...  Orders: TLB-BMP (Basic Metabolic Panel-BMET) (99991111) TLB-Hepatic/Liver Function Pnl (80076-HEPATIC) TLB-TSH (Thyroid Stimulating Hormone) (84443-TSH) TLB-T3, Free (Triiodothyronine) (84481-T3FREE) TLB-T4 (Thyrox), Free [phone])  Problem # 2:  ASTHMATIC BRONCHITIS, ACUTE (ICD-466.0) Stable-  no recent exac... using Proair Prn. The following medications were removed from the medication list:    Zithromax Z-pak 250 Mg Tabs (Azithromycin) .Marland Kitchen... Take as directed Her updated medication list for this problem includes:    Proair Hfa 108 (90 Base) Mcg/act Aers (Albuterol sulfate) ..... Inhale 2 puffs every 6 hours as needed for wheezing...  Problem # 3:  CEREBROVASCULAR DISEASE (ICD-437.9) Known sm vessel dis on prev scans... continue ASA daily.  Problem # 4:  HYPERLIPIDEMIA (B2193296.4) She isn't fasting today-  continue current dose of Simva & FishOil... Her updated medication list for this problem includes:    Simvastatin 20 Mg Tabs (Simvastatin) .Marland Kitchen... Take 1 tab by mouth at bedtime...  Problem # 5:  OVERWEIGHT (ICD-278.02) She will try harder regarding diet + exercise program...  Problem # 6:  OSTEOARTHRITIS (ICD-715.90) Stable-  she is content to continue OTC NSAIDs as needed...   Problem # 7:  DEPRESSION (ICD-311) Her major issue-  continue Rx per DrSteiner, currently on a 5 med regimen...  Problem # 8:  CONSTIPATION (ICD-564.00) We discussed laying off the ExLax and using the below meds regularly... Her updated medication list for this problem includes:    Miralax Powd (Polyethylene glycol 3350) .Marland Kitchen... Take 1 capful mixed in water two times a day...    Senokot S 8.6-50 Mg Tabs (Sennosides-docusate sodium) .Marland Kitchen... Take 2 tabs by mouth at bedtime...  Complete Medication List: 1)  Proair Hfa 108 (90 Base) Mcg/act Aers (Albuterol sulfate) .... Inhale 2 puffs every 6  hours as needed for wheezing... 2)  Aspirin 81 Mg Tabs (Aspirin) .... Take 1 tablet by mouth once a day 3)  Simvastatin 20 Mg Tabs (Simvastatin) .... Take 1 tab by mouth at bedtime.Marland KitchenMarland Kitchen 4)  Fish Oil 1000 Mg Caps (Omega-3 fatty acids) .... Take 1 cap by mouth once daily.Marland KitchenMarland Kitchen 5)  Synthroid 125 Mcg Tabs (Levothyroxine sodium) .... Take 1 tab by mouth once daily.Marland KitchenMarland Kitchen 6)  Miralax Powd (Polyethylene glycol 3350) .... Take 1 capful mixed in water two times a day... 7)  Senokot S 8.6-50 Mg Tabs (Sennosides-docusate sodium) .... Take 2 tabs by mouth at bedtime.Marland KitchenMarland Kitchen 8)  Wellbutrin Xl 150 Mg Tb24 (Bupropion hcl) .... As directed by drsteiner... 9)  Lexapro 20 Mg Tabs (Escitalopram oxalate) .... As directed by drsteiner... 10)  Zyprexa 2.5 Mg Tabs (Olanzapine) .... As directed by drsteiner... 11)  Lithobid 300 Mg Cr-tabs (Lithium carbonate) .... Take 2 capsules by mouth once daily 12)  Deplin 15 Mg Tabs (L-methylfolate) .... Take 1 tab daily as directed by drsteiner... 13)  Viactiv [id number] Mg-unt-mcg Chew (Calcium-vitamin d-vitamin k) .... Take 1-2 daily as calcium supplement... 14)  Multivitamins Tabs (Multiple vitamin) .... Take 1 tablet by mouth once a day 15)  Vitamin D 1000 Unit Tabs (Cholecalciferol) .... Take 1 cap by mouth once daily...  Patient Instructions: 1)  Today we updated your med list- see below.... 2)  Continue your current meds the same... 3)  Today we did your follow up blood work- including the extended thyroid function tests... please call the "phone tree" in a few days for your lab results.Marland KitchenMarland Kitchen 4)  We will send copies to DrDteiner as well... 5)  Call for any problems.Marland KitchenMarland Kitchen 6)  Please schedule a follow-up appointment in 6 months. Prescriptions: SYNTHROID 125 MCG TABS (LEVOTHYROXINE SODIUM) take 1 tab by mouth once daily...  #30 x prn   Entered and Authorized by:   Noralee Space MD   Signed by:   Noralee Space MD on 12/08/2009   Method used:   Electronically to        [facility]  [phone]* (retail)       [address]       [place], [place]  [address]       Ph: [phone] or [phone]       Fax: [phone]   RxID:   [id number]

## 2010-11-01 NOTE — Progress Notes (Signed)
Summary: cough  Phone Note Call from Patient Call back at Home Phone [phone]   Caller: Patient Call For: nadel Summary of Call: Pt c/o cold, cough x 3 wks coughing up yellow phlegm pls advise.//[facility] [address] Initial call taken by: Netta Neat,  September 10, 2010 4:35 PM  Follow-up for Phone Call        called and spoke with pt and she stated that she has had a cold x 3 weeks with cough and yellow sputum---denies any fever---uses [facility] [address]----NKDA----please advise [name] CMA  September 10, 2010 5:20 PM   Additional Follow-up for Phone Call Additional follow up Details #1::        per SN----use the mucinex dm  otc   1-2 by mouth two times a day with fluids and zpak #1  until gone.  called and spoke with pt and she is aware [name] CMA  September 10, 2010 5:38 PM     New/Updated Medications: ZITHROMAX Z-PAK 250 MG TABS (AZITHROMYCIN) take as directed Prescriptions: ZITHROMAX Z-PAK 250 MG TABS (AZITHROMYCIN) take as directed  #1 pack x 0   Entered by:   [name] CMA   Authorized by:   Noralee Space MD   Signed by:   [name] CMA on 09/10/2010   Method used:   Electronically to        [facility]  [phone]* (retail)       [address]       [place],   [address]       Ph: [phone] or [phone]       Fax: [phone]   RxID:   [id number]

## 2010-11-01 NOTE — Assessment & Plan Note (Signed)
Summary: 11:45/PER CHECK OUT/OK PER KELLY/SAF      Allergies Added: NKDA  Visit Type:  Follow-up Referring Provider:  Dr Lenna Gilford Primary Provider:  scott nadel,md   History of Present Illness: Ms Kasparian is a pleasant 77 yo WF with a depression, asthma, and recently diagnosed atrial fibrillation who presents today for EP follow-up.  She presented to Dr Jeannine Kitten office 08/03/10 for routine follow-up and was found to have atrial fibrillation with HR 120s.  She was started on toprol XL 25mg  daily. She has done well with rate control.  She states that she is unaware of symptoms of afib.  She is dypsneic with moderate activity but feels that this is very chronic.  She denies CP, palpitations, orthopnea, edema, presyncope, or syncope.  She is otherwise without complaint today.  Current Medications (verified): 1)  Proair Hfa 108 (90 Base) Mcg/act  Aers (Albuterol Sulfate) .... Inhale 2 Puffs Every 6 Hours As Needed For Wheezing... 2)  Simvastatin 20 Mg  Tabs (Simvastatin) .... Take 1 Tab By Mouth At Bedtime.Marland KitchenMarland Kitchen 3)  Fish Oil 1000 Mg Caps (Omega-3 Fatty Acids) .... Take 2 Cap Sby Mouth Once Daily.Marland KitchenMarland Kitchen 4)  Synthroid 125 Mcg Tabs (Levothyroxine Sodium) .... Take 1 Tab By Mouth Once Daily.Marland KitchenMarland Kitchen 5)  Miralax   Powd (Polyethylene Glycol 3350) .... Take 1 Capful Mixed in Water Two Times A Day... 6)  Wellbutrin Xl 150 Mg  Tb24 (Bupropion Hcl) .... As Directed By Drsteiner... 7)  Lexapro 20 Mg  Tabs (Escitalopram Oxalate) .... As Directed By Drsteiner... 8)  Zyprexa 2.5 Mg  Tabs (Olanzapine) .... As Directed By Drsteiner... 9)  Lithobid 300 Mg Cr-Tabs (Lithium Carbonate) .... Take 2 Capsules By Mouth Once Daily 10)  Deplin 15 Mg Tabs (L-Methylfolate) .... Take 1 Tab Daily As Directed By Drsteiner... 11)  Vitamin D 1000 Unit Tabs (Cholecalciferol) .... Take 1 Cap By Mouth Once Daily... 12)  Metoprolol Succinate 25 Mg Xr24h-Tab (Metoprolol Succinate) .... Take 2 Tab By Mouth Once Daily... 13)  Pradaxa 150 Mg Caps  (Dabigatran Etexilate Mesylate) .... One By Mouth Two Times A Day  Allergies (verified): No Known Drug Allergies  Past History:  Past Medical History: ASTHMATIC BRONCHITIS, ACUTE (ICD-466.0) PERSISTENT ATRIAL FIBRILLATION dx 08/03/10 CEREBROVASCULAR DISEASE (ICD-437.9) VENOUS INSUFFICIENCY (ICD-459.81) HYPERLIPIDEMIA (ICD-272.4) HYPOTHYROIDISM (ICD-244.9) OVERWEIGHT (ICD-278.02) DIVERTICULOSIS OF COLON (ICD-562.10) COLONIC POLYPS (ICD-211.3) OSTEOARTHRITIS (ICD-715.90) OSTEOPENIA (ICD-733.90) DEPRESSION (ICD-311)  Past Surgical History: Reviewed history from 08/06/2010 and no changes required. tonsellectomy  Social History: Reviewed history from 08/06/2010 and no changes required. married and lives in [place] with spouse 4 children never smoked caffeine use  1 daily no alcohol use  Vital Signs:  Patient profile:   77 year old female Height:      68 inches Weight:      208 pounds BMI:     31.74 Pulse rate:   60 / minute BP sitting:   120 / 80  (left arm)  Vitals Entered By: Margaretmary Bayley CMA (September 21, 2010 12:02 PM)  Physical Exam  General:  Well developed, well nourished, in no acute distress. Head:  normocephalic and atraumatic Eyes:  PERRLA/EOM intact; conjunctiva and lids normal. Mouth:  Teeth, gums and palate normal. Oral mucosa normal. Neck:  supple Lungs:  Clear bilaterally to auscultation and percussion. Heart:  iRRR, no m/r/g Abdomen:  Bowel sounds positive; abdomen soft and non-tender without masses, organomegaly, or hernias noted. No hepatosplenomegaly. Msk:  Back normal, normal gait. Muscle strength and tone normal. Extremities:  No clubbing or cyanosis.  Neurologic:  Alert and oriented x 3.   Impression & Recommendations:  Problem # 1:  ATRIAL FIBRILLATION (ICD-427.31) The patient presents today for EP follow-up regarding minimally symptomatic persistent atrial fibrillation.  Her ventricular rates appear to be much better controlled at this  time and she is now minimally sypmtomatic.  Her CHADS2 score is 1 (age >27).  She is appropriately anticoagulated with pradaxa 150mg  bid.  Her CrCl is 50.   She prefers a longterm strategy of rate control over initiation of antiarrhythmic drugs and cardioversion.  Given her advanced age and paucity of symptoms, I think that this is a very reasonable approach.  Problem # 2:  HYPERLIPIDEMIA (ICD-272.4) stable Her updated medication list for this problem includes:    Simvastatin 20 Mg Tabs (Simvastatin) .Marland Kitchen... Take 1 tab by mouth at bedtime...  Patient Instructions: 1)  Your physician recommends that you schedule a follow-up appointment in: 3 months with Dr Rayann Heman

## 2010-11-02 NOTE — Medication Information (Signed)
Summary: [facility]   [facility]   Imported By: Roddie Mc 08/21/2010 10:40:19  _____________________________________________________________________  External Attachment:    Type:   Image     Comment:   External Document

## 2010-11-11 ENCOUNTER — Emergency Department (HOSPITAL_COMMUNITY): Payer: Medicare Other

## 2010-11-11 ENCOUNTER — Inpatient Hospital Stay (HOSPITAL_COMMUNITY)
Admission: EM | Admit: 2010-11-11 | Discharge: 2010-11-15 | DRG: 291 | Disposition: A | Discharge status: Home Health | Payer: Medicare Other | Attending: Internal Medicine | Admitting: Internal Medicine

## 2010-11-11 DIAGNOSIS — M1990 Unspecified osteoarthritis, unspecified site: Secondary | ICD-10-CM | POA: Diagnosis present

## 2010-11-11 DIAGNOSIS — I129 Hypertensive chronic kidney disease with stage 1 through stage 4 chronic kidney disease, or unspecified chronic kidney disease: Secondary | ICD-10-CM | POA: Diagnosis present

## 2010-11-11 DIAGNOSIS — J441 Chronic obstructive pulmonary disease with (acute) exacerbation: Secondary | ICD-10-CM | POA: Diagnosis present

## 2010-11-11 DIAGNOSIS — T380X5A Adverse effect of glucocorticoids and synthetic analogues, initial encounter: Secondary | ICD-10-CM | POA: Diagnosis not present

## 2010-11-11 DIAGNOSIS — I248 Other forms of acute ischemic heart disease: Secondary | ICD-10-CM | POA: Diagnosis present

## 2010-11-11 DIAGNOSIS — E039 Hypothyroidism, unspecified: Secondary | ICD-10-CM | POA: Diagnosis present

## 2010-11-11 DIAGNOSIS — D72829 Elevated white blood cell count, unspecified: Secondary | ICD-10-CM | POA: Diagnosis not present

## 2010-11-11 DIAGNOSIS — I4891 Unspecified atrial fibrillation: Secondary | ICD-10-CM | POA: Diagnosis present

## 2010-11-11 DIAGNOSIS — F329 Major depressive disorder, single episode, unspecified: Secondary | ICD-10-CM | POA: Diagnosis present

## 2010-11-11 DIAGNOSIS — J189 Pneumonia, unspecified organism: Secondary | ICD-10-CM | POA: Diagnosis present

## 2010-11-11 DIAGNOSIS — J9600 Acute respiratory failure, unspecified whether with hypoxia or hypercapnia: Secondary | ICD-10-CM | POA: Diagnosis present

## 2010-11-11 DIAGNOSIS — I4892 Unspecified atrial flutter: Secondary | ICD-10-CM | POA: Diagnosis not present

## 2010-11-11 DIAGNOSIS — E669 Obesity, unspecified: Secondary | ICD-10-CM | POA: Diagnosis present

## 2010-11-11 DIAGNOSIS — J45901 Unspecified asthma with (acute) exacerbation: Secondary | ICD-10-CM | POA: Diagnosis present

## 2010-11-11 DIAGNOSIS — I509 Heart failure, unspecified: Secondary | ICD-10-CM | POA: Diagnosis present

## 2010-11-11 DIAGNOSIS — I5031 Acute diastolic (congestive) heart failure: Principal | ICD-10-CM | POA: Diagnosis present

## 2010-11-11 DIAGNOSIS — I498 Other specified cardiac arrhythmias: Secondary | ICD-10-CM | POA: Diagnosis present

## 2010-11-11 DIAGNOSIS — N189 Chronic kidney disease, unspecified: Secondary | ICD-10-CM | POA: Diagnosis present

## 2010-11-11 DIAGNOSIS — I2489 Other forms of acute ischemic heart disease: Secondary | ICD-10-CM | POA: Diagnosis present

## 2010-11-11 DIAGNOSIS — Z79899 Other long term (current) drug therapy: Secondary | ICD-10-CM

## 2010-11-11 LAB — URINALYSIS, ROUTINE W REFLEX MICROSCOPIC
Leukocytes, UA: NEGATIVE
Nitrite: NEGATIVE
Specific Gravity, Urine: 1.007 (ref 1.005–1.030)
pH: 7 (ref 5.0–8.0)

## 2010-11-11 LAB — COMPREHENSIVE METABOLIC PANEL
ALT: 153 U/L — ABNORMAL HIGH (ref 0–35)
BUN: 18 mg/dL (ref 6–23)
Calcium: 9.4 mg/dL (ref 8.4–10.5)
Glucose, Bld: 158 mg/dL — ABNORMAL HIGH (ref 70–99)
Sodium: 141 mEq/L (ref 135–145)
Total Protein: 6.7 g/dL (ref 6.0–8.3)

## 2010-11-11 LAB — LITHIUM LEVEL: Lithium Lvl: 0.71 mEq/L — ABNORMAL LOW (ref 0.80–1.40)

## 2010-11-11 LAB — DIFFERENTIAL
Basophils Absolute: 0 10*3/uL (ref 0.0–0.1)
Eosinophils Relative: 1 % (ref 0–5)
Lymphocytes Relative: 28 % (ref 12–46)
Neutro Abs: 6.1 10*3/uL (ref 1.7–7.7)
Neutrophils Relative %: 63 % (ref 43–77)

## 2010-11-11 LAB — CBC
HCT: 43.6 % (ref 36.0–46.0)
Hemoglobin: 14.4 g/dL (ref 12.0–15.0)
RBC: 4.61 MIL/uL (ref 3.87–5.11)
RDW: 13.5 % (ref 11.5–15.5)
WBC: 9.6 10*3/uL (ref 4.0–10.5)

## 2010-11-11 LAB — URINE MICROSCOPIC-ADD ON

## 2010-11-11 LAB — BRAIN NATRIURETIC PEPTIDE: Pro B Natriuretic peptide (BNP): 383 pg/mL — ABNORMAL HIGH (ref 0.0–100.0)

## 2010-11-11 LAB — CARDIAC PANEL(CRET KIN+CKTOT+MB+TROPI)
CK, MB: 3.9 ng/mL (ref 0.3–4.0)
Troponin I: 0.19 ng/mL — ABNORMAL HIGH (ref 0.00–0.06)

## 2010-11-11 LAB — POCT CARDIAC MARKERS
CKMB, poc: 1.8 ng/mL (ref 1.0–8.0)
Myoglobin, poc: 90.2 ng/mL (ref 12–200)

## 2010-11-12 ENCOUNTER — Inpatient Hospital Stay (HOSPITAL_COMMUNITY): Payer: Medicare Other

## 2010-11-12 DIAGNOSIS — R7989 Other specified abnormal findings of blood chemistry: Secondary | ICD-10-CM

## 2010-11-12 DIAGNOSIS — I4891 Unspecified atrial fibrillation: Secondary | ICD-10-CM

## 2010-11-12 LAB — LIPID PANEL
Cholesterol: 123 mg/dL (ref 0–200)
Total CHOL/HDL Ratio: 1.9 RATIO

## 2010-11-12 LAB — BASIC METABOLIC PANEL
Chloride: 106 mEq/L (ref 96–112)
GFR calc Af Amer: 47 mL/min — ABNORMAL LOW (ref >60)
Potassium: 4 mEq/L (ref 3.5–5.1)

## 2010-11-12 LAB — CARDIAC PANEL(CRET KIN+CKTOT+MB+TROPI)
CK, MB: 4 ng/mL (ref 0.3–4.0)
Relative Index: INVALID (ref 0.0–2.5)
Troponin I: 0.11 ng/mL — ABNORMAL HIGH (ref 0.00–0.06)

## 2010-11-12 LAB — HEMOGLOBIN A1C
Hgb A1c MFr Bld: 5.7 % — ABNORMAL HIGH (ref <5.7)
Mean Plasma Glucose: 117 mg/dL — ABNORMAL HIGH (ref <117)

## 2010-11-13 LAB — URINE CULTURE: Colony Count: NO GROWTH

## 2010-11-13 LAB — BASIC METABOLIC PANEL
CO2: 27 mEq/L (ref 19–32)
Calcium: 9.5 mg/dL (ref 8.4–10.5)
Chloride: 104 mEq/L (ref 96–112)
Glucose, Bld: 195 mg/dL — ABNORMAL HIGH (ref 70–99)
Sodium: 140 mEq/L (ref 135–145)

## 2010-11-14 LAB — BASIC METABOLIC PANEL
BUN: 42 mg/dL — ABNORMAL HIGH (ref 6–23)
CO2: 27 mEq/L (ref 19–32)
Chloride: 104 mEq/L (ref 96–112)
Glucose, Bld: 191 mg/dL — ABNORMAL HIGH (ref 70–99)
Potassium: 3.8 mEq/L (ref 3.5–5.1)
Sodium: 139 mEq/L (ref 135–145)

## 2010-11-14 NOTE — H&P (Signed)
[name], [name]               ACCOUNT NO.:  [id number]  MEDICAL RECORD NO.:  [id number]           PATIENT TYPE:  I  LOCATION:  [id number]                         FACILITY:  [facility]  PHYSICIAN:  Kieth Brightly, MDDATE OF BIRTH:  [birth year]  DATE OF ADMISSION:  11/11/2010 DATE OF DISCHARGE:                             HISTORY & PHYSICAL   PRIMARY CARE PHYSICIAN:  Teressa Lower, MD  PSYCHIATRY:  Jonnie Kind. Albertine Patricia, MD  CHIEF COMPLAINT:  Shortness of breath.  HISTORY OF PRESENT ILLNESS:  Ms. Sonya Kaufman is a 77 year old female who lives with her husband and she is pretty well active to a mild to moderate extent in daily life and she is very well independent otherwise.  She has a son who suffers from cerebral palsy who they take care of too.  Yesterday the husband and wife took the son in the car for a ride and during the ride she was exposed to significant cold air due to the wind that was in town yesterday.  She came back in the evening; she was feeling slightly short of breath.  In the late night, she was significantly short of breath.  She had her supper at 5:30 p.m.  She went to bed but her shortness of breath worsened.  She denies any chest pain and she was coughing also all the time and so she decided come to the emergency room.  In the ER she was seen and [facility] hospitalist's were consulted to admit the patient.  When she came to the ER, her blood pressure was very high at 190/105 and heart rate of 137 at that time with atrial fibrillation.  Her current rate is well-controlled and she is breathing much better at this time as per the daughter.  She has received some nebulizer treatments.  PAST MEDICAL HISTORY: 1. Major depressive disorder, for which she was admitted in the psych     inpatient facility once and currently she is being followed by Dr.     Pearson Grippe. 2. Hypothyroidism. 3. Atrial fibrillation, diagnosed a year ago. 4. Decreased exercise tolerance - gets  shortness of breath even at     less than one-half of a block. 5. Obesity. 6. No history of falls.  GERIATRIC HISTORY:  She is ADL and IADL independent.  She has developed significant shortness of breath even with short walking over the past few months which has worsened.  She does not report any falls.  She is on anticoagulation with Pradaxa for atrial fibrillation.  SOCIAL HISTORY:  No smoking, never smoked.  No history of anybody else smoking in the house and no history of long-term exposure to secondhand smoke either.  No alcohol.  No drugs.  Married, lives with husband, has two kids.  One son has cerebral palsy.  FAMILY HISTORY:  No history of psychiatric issues in the family and the patient cannot recall much other than that.  MEDICATIONS:  On Pradaxa, Synthroid, Wellbutrin, Toprol XL, lithium, Zyprexa, simvastatin, ProAir.  REVIEW OF SYSTEMS:  A 12 point system review unremarkable.  Positive pertinent features as in the history of present illness, negative otherwise.  PHYSICAL EXAMINATION:  VITAL SIGNS:  Currently, blood pressure 150/81, heart rate 109, respirations are ranging from 18 to 22, temperature 98 degrees Fahrenheit, O2 saturations of 95% to 99% on 2 to 3 liters oxygen.  GENERAL:  The patient is awake, alert, oriented at this time, able to speak in full sentences, speaks in a very low tone.  Afebrile. Not in any obvious distress. HEAD AND NECK:  No bleeding in oral or nasal mucosa.  No JVD.  No bruit. Tongue is moist.  No thrush. CHEST:  Bilateral air entry is good anteriorly and posteriorly.  There are significant crackles heard posteriorly on both sides.  There are some coarse rales also heard on the right side posteriorly. CARDIOVASCULAR:  S1, S2 heard, regular.  No murmurs. ABDOMEN:  Abdomen is soft, nontender.  No organomegaly.  Bowel sounds positive. EXTREMITIES:  No pedal edema seen at this time. NEUROLOGIC:  No obvious motor or sensory deficits.  The  patient's comprehension intact. PSYCHIATRIC:  Flat affect, depressed mood.  No hallucinations or delusions.  No agitation.  PERTINENT LABORATORY AND X-RAY DATA: 1. Chest x-ray shows right lower lobe infiltrate is seen.  In     addition, there is significant pulmonary congestion on both sides     in the lower and upper zones with cephalization. 2. EKG shows atrial fibrillation with a heart rate with rapid     ventricular response at 101 beats a minute.  No significant ST or T-     wave changes of ischemia seen at this time even though there was a     question of possible lateral wall ischemia due to T inversion     isolated in Lead aVL. 3. BNP is 383. 4. BMP:  Sodium 141, potassium 4.4, chloride 109, bicarb 24, glucose     158, BUN 18, creatinine 1.16, and calcium 9.4. 5. LFTs:  Total bilirubin 0.9, alk phos 88, AST 151, ALT 153, total     protein 6.7, albumin 3.8.  Point-of-care cardiac marker troponin I     less than 0.05. 6. CBC:  WBC 9.3, hemoglobin 14.4, hematocrit 43.6, platelets 176.  ASSESSMENT: 1. Acute respiratory failure which is possibly secondary to: (a)  Possibility of right lower lobe pneumonia as evidenced on chest x- ray and some clinical features. (b)  Acute pulmonary edema, possibly due to hypertension. 1. Hypertensive emergency with acute pulmonary edema. 2. Atrial fibrillation with rapid ventricular response, compounding     the production of pulmonary edema due to left heart failure. 3. Rule out ongoing heart failure as per clinical symptoms of     increasing shortness of breath. 4. Rule out ischemic heart disease. 5. Major depressive disorder, currently on stable mental status. 6. Obesity.  PLAN: 1. Pulmonary:  Luckily the patient is stepped down for close     observation now.  She is a full code.  Blood cultures have been     drawn and we will empirically treat this as pneumonia since she has     been having coughing and making some sputum also.  Will  start her     on ceftriaxone, azithromycin; she does not have any allergies to     that.  Will also continue Solu-Medrol at 60 mg IV q.6 hourly for     the asthma exacerbation.  She has been told that she has asthma for     the past few years by her PMD, Dr. Lenna Gilford, who actually is a pulmonologist. 1. Acute  pulmonary edema/cardiac:  I believe this is mostly secondary     to the hypertensive emergency.  She is on Toprol XL.  I would start     her on Lasix 40 mg IV q.12 hourly at this time.  I am not     initiating an ACE inhibitor at this time since that may compound     the effects of decreased perfusion to the kidney and cause     worsening of kidney function at this time but the patient can be     safely discharged on that medication once she is stable.     Currently, for control of blood pressure, I will continue the     Toprol XL at this time.  Will also start her on another     antihypertensive medications such as hydralazine which will be     started at 25 mg p.o. daily.  If the heart rate is not well-     controlled, I would think of initiating digoxin on this patient. 2. Psychiatric:  We will check a lithium level stat and continue her     psych medications since they are very important in maintaining her     mental status so will continue her lithium, Wellbutrin and Zyprexa. 3. Will start her on Zantac for the GI prophylaxis.  She is already on     Pradaxa which will be continued, so there is no need for any     further DVT prophylaxis.  We will continue her Synthroid by mouth. 4. Swallow:  I would keep her on Dysphagia 1 diet to avoid any     aspirations in this current setting.  Once she is more stable, we     can upgrade her diet and also consult swallow evaluation to make     sure that she does not aspirate. 5. Nebulizers will be given as per schedule.  The patient will be     admitted to step-down unit.  Total of 60 minutes spent on the admission.     Kieth Brightly, MD     UT/MEDQ  D:  11/11/2010  T:  11/11/2010  Job:  [id number]  cc:   Deborra Medina. Lenna Gilford, [place] [address]  Jane L Steiner, MD [facility] [address], [place],  [address]  Electronically Signed by Kieth Brightly MD on 11/14/2010 03:14:47 PM

## 2010-11-15 ENCOUNTER — Inpatient Hospital Stay (HOSPITAL_COMMUNITY): Payer: Medicare Other

## 2010-11-15 LAB — CBC
Platelets: 210 10*3/uL (ref 150–400)
RBC: 4.88 MIL/uL (ref 3.87–5.11)
RDW: 13.9 % (ref 11.5–15.5)
WBC: 12.5 10*3/uL — ABNORMAL HIGH (ref 4.0–10.5)

## 2010-11-15 LAB — BASIC METABOLIC PANEL
BUN: 35 mg/dL — ABNORMAL HIGH (ref 6–23)
Chloride: 107 mEq/L (ref 96–112)
GFR calc Af Amer: 52 mL/min — ABNORMAL LOW (ref >60)
GFR calc non Af Amer: 43 mL/min — ABNORMAL LOW (ref >60)
Potassium: 4.5 mEq/L (ref 3.5–5.1)

## 2010-11-15 LAB — LITHIUM LEVEL: Lithium Lvl: 0.77 mEq/L — ABNORMAL LOW (ref 0.80–1.40)

## 2010-11-16 ENCOUNTER — Telehealth: Payer: Self-pay | Admitting: Pulmonary Disease

## 2010-11-17 LAB — CULTURE, BLOOD (ROUTINE X 2): Culture  Setup Time: 201202122016

## 2010-11-19 ENCOUNTER — Telehealth: Payer: Self-pay | Admitting: Pulmonary Disease

## 2010-11-21 NOTE — Progress Notes (Signed)
Summary: med concerns/ recent hosp discharge-daughter returned call  Phone Note Call from Patient   Caller: Daughter [name] Call For: Sonya Kaufman Summary of Call: daughter wants clarification re: rx for synthroid. says this was changed per discharge/ hospital. call daughter robin bass at work- cancer [facility] [phone] Initial call taken by: Cooper Render, CNA,  November 16, 2010 11:56 AM  Follow-up for Phone Call        [email or web address] for pts daughter robin bass [name] [facility]  November 16, 2010 12:22 PM   daughter robing returned call. per TD, triage will call her back shortly. Cooper Render, CNA  November 16, 2010 1:19 PM  called daughter back and she stated that pt got admitted last week for CHF and PNA and was discharged yesterday and on the discharge sheet her synthroid was listed at 170mcg daily and pt has been on 161mcg daily...daughter wanted to make sure which med pt is to be taking.  will call daughter back today [name] [facility]  November 16, 2010 1:22 PM   Additional Follow-up for Phone Call Additional follow up Details #1::        called and spoke with robin and she is aware that on pts discharge from the hospital that they did check pts thyroid function and it was low and this is why they changed her synthroid from 125 micrograms to 112 micrograms daily---robin is aware that SN will follow up with this at pts next ov---med list has been updated today [name] [facility]  November 16, 2010 4:54 PM     New/Updated Medications: SYNTHROID 112 MCG TABS (LEVOTHYROXINE SODIUM) take one tablet by mouth once daily per hosp dishcharge

## 2010-11-23 ENCOUNTER — Encounter: Payer: Self-pay | Admitting: Pulmonary Disease

## 2010-11-25 ENCOUNTER — Telehealth (INDEPENDENT_AMBULATORY_CARE_PROVIDER_SITE_OTHER): Payer: Self-pay | Admitting: *Deleted

## 2010-11-26 ENCOUNTER — Other Ambulatory Visit: Payer: Self-pay | Admitting: Adult Health

## 2010-11-26 ENCOUNTER — Other Ambulatory Visit: Payer: Medicare Other

## 2010-11-26 ENCOUNTER — Encounter: Payer: Self-pay | Admitting: Adult Health

## 2010-11-26 ENCOUNTER — Other Ambulatory Visit: Payer: Self-pay | Admitting: Pulmonary Disease

## 2010-11-26 ENCOUNTER — Ambulatory Visit (INDEPENDENT_AMBULATORY_CARE_PROVIDER_SITE_OTHER): Payer: Medicare Other | Admitting: Adult Health

## 2010-11-26 ENCOUNTER — Ambulatory Visit (INDEPENDENT_AMBULATORY_CARE_PROVIDER_SITE_OTHER)
Admission: RE | Admit: 2010-11-26 | Discharge: 2010-11-26 | Disposition: A | Discharge status: Home / Self Care | Payer: Medicare Other | Source: Ambulatory Visit | Attending: Pulmonary Disease | Admitting: Pulmonary Disease

## 2010-11-26 DIAGNOSIS — I4891 Unspecified atrial fibrillation: Secondary | ICD-10-CM

## 2010-11-26 DIAGNOSIS — J209 Acute bronchitis, unspecified: Secondary | ICD-10-CM

## 2010-11-26 LAB — BASIC METABOLIC PANEL
CO2: 31 mEq/L (ref 19–32)
Calcium: 9.3 mg/dL (ref 8.4–10.5)
Chloride: 103 mEq/L (ref 96–112)
Creatinine, Ser: 1.7 mg/dL — ABNORMAL HIGH (ref 0.4–1.2)
Sodium: 141 mEq/L (ref 135–145)

## 2010-11-26 LAB — BRAIN NATRIURETIC PEPTIDE: Pro B Natriuretic peptide (BNP): 424.3 pg/mL — ABNORMAL HIGH (ref 0.0–100.0)

## 2010-11-27 ENCOUNTER — Telehealth: Payer: Self-pay | Admitting: Pulmonary Disease

## 2010-11-27 NOTE — Progress Notes (Signed)
Summary: BMET  Phone Note Call from Patient   Caller: Sonya Kaufman w/ [facility] Call For: Sonya Kaufman Summary of Call: nurse was unable to draw blood for BMET (at pt's home today- tried twice). pls advise. [id number] Initial call taken by: Cooper Render, CNA,  November 19, 2010 3:21 PM  Follow-up for Phone Call        Spoke with [name].  She states that they stuck pt x 2 this am and were unable to get BMET.  She states that she is due to go back to pt's home on 2/23 which is this thursday.  Wants to know if okay to try again then or sooner.  Pls advise thanks! Follow-up by: Tilden Dome,  November 19, 2010 4:16 PM  Additional Follow-up for Phone Call Additional follow up Details #1::        per SN---ok to wait unti thursday for BMP to be drawn since nurse was unable to get labs done today [name] CMA  November 19, 2010 4:45 PM

## 2010-11-28 NOTE — Discharge Summary (Signed)
[name], Sonya Kaufman               ACCOUNT NO.:  [id number]  MEDICAL RECORD NO.:  [id number]           PATIENT TYPE:  I  LOCATION:  [id number]                         FACILITY:  [facility]  PHYSICIAN:  Celvin Taney I Zannie Runkle, MD      DATE OF BIRTH:  Feb 22, 1931  DATE OF ADMISSION:  11/11/2010 DATE OF DISCHARGE:  11/15/2010                              DISCHARGE SUMMARY   PRIMARY CARE PHYSICIAN:  Deborra Medina. Lenna Gilford, MD  CARDIOLOGIST:  Thompson Grayer, MD, [facility].  DISCHARGE DIAGNOSES: 1. Acute respiratory failure, which is resolved. 2. Right lower lobe community pneumonia. 3. Acute diastolic congestive heart failure. 4. Acute pulmonary edema, resolved. 5. Atrial fibrillation with rapid ventricular response, improved. 6. Hypertension. 7. Mild leukocytosis, felt to be secondary to steroid. 8. Hypothyroidism with low TSH. 9. History of major depressive disorder, on lithium. 10.Obesity. 11.Acute on chronic renal insufficiency, baseline creatinine around     0.4.  DISCHARGE MEDICATIONS: 1. Cardizem CD 360 mg one daily. 2. Levothyroxine 112 mcg daily. 3. Avelox 400 mg p.o. daily for 5 days. 4. Prednisone taper dose within 6 days. 5. Lasix 20 mg p.o. daily. 6. Potassium chloride 20 mEq p.o. daily. 7. Bupropion 300 mg daily. 8. Methylfolate 15 mg daily. 9. Fish oil. 10.Lexapro 20 p.o. daily. 11.Lithium carbonate 300 mg two capsules q.p.m. 12.Metoprolol 50 mg p.o. q.a.m. 13.MiraLax 17 g daily. 14.Pradaxa 150 mg p.o. twice daily. 15.ProAir 2 puffs q.6 h. p.r.n. 16.Simvastatin 20 mg p.o. daily. 17.Vitamin D3. 18.Zyprexa 2.5 mg take 1-1/2 tablet q.a.m.  FOLLOWUP:  The patient will follow up with Dr. Teressa Lower on Monday, also needs to follow up with Dr. Thompson Grayer after 2 weeks.  CONSULTATIONS:  Cardiology consulted.  PROCEDURES: 1. Chest x-ray new asymmetrical edema or infiltrate, mild     cardiomegaly. 2. Chest x-ray, improved congestive heart failure.  HOSPITAL COURSE:  This is  a 77 year old female with a history of AFib diagnosed in November 2011.  She is on Pradaxa.  She was admitted with progressive shortness of breath.  She was found to have a blood pressure of 190/105 and heart rate of 137, was AFib.  She also had a chest x-ray suggesting pulmonary edema and congestion. 1. Acute respiratory failure was felt could be secondary to pulmonary     edema and possibility of pneumonia.  The patient was started on IV     Lasix and IV antibiotic.  The patient was noticed to have elevated     cardiac markers and this was felt most probably secondary to the     demand ischemia and Cardiology consulted and felt it could be     secondary to AFib with RVR and pulmonary exacerbation.  AFib, the     patient's rate remained under control with Cardizem 360 mg and     metoprolol and the patient continued her dose of Pradaxa.  She had     recent 2-D echo with normal ejection fraction and was felt that the     elevated troponin most probably secondary to acute exacerbation of     her symptoms.  Her AFib remained under  control, rate under good     control. 2. Possibility of pneumonia.  The patient treated as a case of     community-acquired pneumonia and the patient was discharged with     extra 5 days of antibiotic. 3. Possibility of bronchitis.  The patient will have a short period of     p.o. prednisone and she will her continue her dose of albuterol.     The patient denies any history of smoking in the past.  At this     time, I feel the patient is stable to be discharged.  She was     weaned off oxygen.  Her renal insufficiency mildly elevated and     this was felt to be secondary to the Lasix.  According Lasix dose     decreased to 20 mg p.o. daily and potassium supplement was added.     The patient need to follow up with her primary care physician on     Monday to repeat her BMET.  The patient informed and husband     informed.  For other psych issue, the patient on  lithium and this     is made to be closely monitored her psychiatric. 4. Hypothyroidism.  TSH was found to be low and a TSH was done.     Patient will be discharged with 112 mcg.  Her thyroid function test     can be followed by her primary care physician.  At this time, I     feel the patient is medically stable to be discharged.  Follow up     with Dr. Teressa Lower and follow up with Dr. Thompson Grayer in 2     weeks.     Sonya Schauer Franco Collet, MD     HIE/MEDQ  D:  11/15/2010  T:  11/16/2010  Job:  [id number]  Electronically Signed by Donia Ast MD on 11/28/2010 02:13:24 PM

## 2010-11-29 ENCOUNTER — Ambulatory Visit: Payer: Medicare Other | Admitting: Pulmonary Disease

## 2010-12-03 ENCOUNTER — Emergency Department (HOSPITAL_COMMUNITY): Payer: Medicare Other

## 2010-12-03 ENCOUNTER — Telehealth: Payer: Self-pay | Admitting: Internal Medicine

## 2010-12-03 ENCOUNTER — Telehealth (INDEPENDENT_AMBULATORY_CARE_PROVIDER_SITE_OTHER): Payer: Self-pay | Admitting: *Deleted

## 2010-12-03 ENCOUNTER — Inpatient Hospital Stay (HOSPITAL_COMMUNITY)
Admission: EM | Admit: 2010-12-03 | Discharge: 2010-12-07 | DRG: 292 | Disposition: A | Discharge status: Home / Self Care | Payer: Medicare Other | Attending: Internal Medicine | Admitting: Internal Medicine

## 2010-12-03 DIAGNOSIS — I5033 Acute on chronic diastolic (congestive) heart failure: Principal | ICD-10-CM | POA: Diagnosis present

## 2010-12-03 DIAGNOSIS — F3289 Other specified depressive episodes: Secondary | ICD-10-CM | POA: Diagnosis present

## 2010-12-03 DIAGNOSIS — I509 Heart failure, unspecified: Secondary | ICD-10-CM | POA: Diagnosis present

## 2010-12-03 DIAGNOSIS — E669 Obesity, unspecified: Secondary | ICD-10-CM | POA: Diagnosis present

## 2010-12-03 DIAGNOSIS — J45909 Unspecified asthma, uncomplicated: Secondary | ICD-10-CM | POA: Diagnosis present

## 2010-12-03 DIAGNOSIS — Z8673 Personal history of transient ischemic attack (TIA), and cerebral infarction without residual deficits: Secondary | ICD-10-CM

## 2010-12-03 DIAGNOSIS — F329 Major depressive disorder, single episode, unspecified: Secondary | ICD-10-CM | POA: Diagnosis present

## 2010-12-03 DIAGNOSIS — E039 Hypothyroidism, unspecified: Secondary | ICD-10-CM | POA: Diagnosis present

## 2010-12-03 DIAGNOSIS — N179 Acute kidney failure, unspecified: Secondary | ICD-10-CM | POA: Diagnosis present

## 2010-12-03 DIAGNOSIS — I4891 Unspecified atrial fibrillation: Secondary | ICD-10-CM | POA: Diagnosis present

## 2010-12-03 DIAGNOSIS — Z7901 Long term (current) use of anticoagulants: Secondary | ICD-10-CM

## 2010-12-03 DIAGNOSIS — E785 Hyperlipidemia, unspecified: Secondary | ICD-10-CM | POA: Diagnosis present

## 2010-12-03 LAB — DIFFERENTIAL
Basophils Absolute: 0 10*3/uL (ref 0.0–0.1)
Basophils Relative: 0 % (ref 0–1)
Lymphocytes Relative: 25 % (ref 12–46)
Monocytes Absolute: 0.8 10*3/uL (ref 0.1–1.0)
Neutro Abs: 5.5 10*3/uL (ref 1.7–7.7)
Neutrophils Relative %: 63 % (ref 43–77)

## 2010-12-03 LAB — URINALYSIS, ROUTINE W REFLEX MICROSCOPIC
Bilirubin Urine: NEGATIVE
Glucose, UA: NEGATIVE mg/dL
Hgb urine dipstick: NEGATIVE
Ketones, ur: NEGATIVE mg/dL
Protein, ur: NEGATIVE mg/dL
pH: 7 (ref 5.0–8.0)

## 2010-12-03 LAB — POCT I-STAT, CHEM 8
BUN: 28 mg/dL — ABNORMAL HIGH (ref 6–23)
Calcium, Ion: 1.23 mmol/L (ref 1.12–1.32)
Chloride: 102 meq/L (ref 96–112)
Creatinine, Ser: 1.8 mg/dL — ABNORMAL HIGH (ref 0.4–1.2)
Glucose, Bld: 124 mg/dL — ABNORMAL HIGH (ref 70–99)
HCT: 42 % (ref 36.0–46.0)
Hemoglobin: 14.3 g/dL (ref 12.0–15.0)
Potassium: 4 meq/L (ref 3.5–5.1)
Sodium: 140 meq/L (ref 135–145)
TCO2: 28 mmol/L (ref 0–100)

## 2010-12-03 LAB — CBC
HCT: 42.8 % (ref 36.0–46.0)
Hemoglobin: 13.1 g/dL (ref 12.0–15.0)
RBC: 4.43 MIL/uL (ref 3.87–5.11)

## 2010-12-03 LAB — POCT CARDIAC MARKERS
CKMB, poc: <1 ng/mL — ABNORMAL LOW (ref 1.0–8.0)
Myoglobin, poc: 87.4 ng/mL (ref 12–200)
Troponin i, poc: <0.05 ng/mL (ref 0.00–0.09)

## 2010-12-03 LAB — PROTIME-INR
INR: 1.32 (ref 0.00–1.49)
Prothrombin Time: 16.6 s — ABNORMAL HIGH (ref 11.6–15.2)

## 2010-12-03 LAB — BRAIN NATRIURETIC PEPTIDE: Pro B Natriuretic peptide (BNP): 398 pg/mL — ABNORMAL HIGH (ref 0.0–100.0)

## 2010-12-04 ENCOUNTER — Ambulatory Visit: Payer: Medicare Other | Admitting: Adult Health

## 2010-12-04 ENCOUNTER — Telehealth: Payer: Self-pay | Admitting: Pulmonary Disease

## 2010-12-04 DIAGNOSIS — I5031 Acute diastolic (congestive) heart failure: Secondary | ICD-10-CM

## 2010-12-04 LAB — LIPID PANEL
Cholesterol: 121 mg/dL (ref 0–200)
HDL: 57 mg/dL (ref >39)
LDL Cholesterol: 52 mg/dL (ref 0–99)
Total CHOL/HDL Ratio: 2.1 RATIO
Triglycerides: 58 mg/dL (ref <150)

## 2010-12-04 LAB — URINE CULTURE
Colony Count: NO GROWTH
Culture: NO GROWTH

## 2010-12-05 ENCOUNTER — Encounter: Payer: Self-pay | Admitting: Pulmonary Disease

## 2010-12-05 ENCOUNTER — Telehealth: Payer: Self-pay | Admitting: Pulmonary Disease

## 2010-12-05 DIAGNOSIS — I509 Heart failure, unspecified: Secondary | ICD-10-CM

## 2010-12-05 LAB — BASIC METABOLIC PANEL
BUN: 22 mg/dL (ref 6–23)
Calcium: 9.1 mg/dL (ref 8.4–10.5)
GFR calc non Af Amer: 34 mL/min — ABNORMAL LOW (ref >60)
Glucose, Bld: 115 mg/dL — ABNORMAL HIGH (ref 70–99)
Sodium: 141 mEq/L (ref 135–145)

## 2010-12-05 LAB — BRAIN NATRIURETIC PEPTIDE: Pro B Natriuretic peptide (BNP): 589 pg/mL — ABNORMAL HIGH (ref 0.0–100.0)

## 2010-12-06 LAB — CBC
MCH: 29.9 pg (ref 26.0–34.0)
MCHC: 30.8 g/dL (ref 30.0–36.0)
Platelets: 196 10*3/uL (ref 150–400)
RBC: 4.52 MIL/uL (ref 3.87–5.11)

## 2010-12-06 LAB — BASIC METABOLIC PANEL
Calcium: 9 mg/dL (ref 8.4–10.5)
Creatinine, Ser: 1.61 mg/dL — ABNORMAL HIGH (ref 0.4–1.2)
GFR calc Af Amer: 38 mL/min — ABNORMAL LOW (ref >60)

## 2010-12-06 LAB — BRAIN NATRIURETIC PEPTIDE: Pro B Natriuretic peptide (BNP): 323 pg/mL — ABNORMAL HIGH (ref 0.0–100.0)

## 2010-12-06 NOTE — Assessment & Plan Note (Signed)
Summary: Acute NP office visit - LE edema / dyspnea   Copy to:  Dr Lenna Gilford Primary Provider/Referring Provider:  scott nadel,md  CC:  increased SOB and LE edema in both ankles x1day.  daughter gave extra yesterday afternoon.  History of Present Illness: 77 year old female with known history of depression, hypothyroidism, hyperlipidemia    November 26, 2010 --Presents for an acute office visit. Complains of increased SOB, LE edema in both ankles x1day.  Daughter gave extra yesterday afternoon. Admitted  11/11/2010 -- 11/15/2010 for Acute respiratory failure , right lower lobe community pneumonia, Acute diastolic congestive heart failure. . Acute pulmonary edema,  Atrial fibrillation with rapid ventricular response. TX with IV lasix and abx.   The patient was noticed to have elevated   cardiac markers and this was felt most probably secondary to the       demand ischemia and Cardiology consulted and felt it could be  secondary to AFib with RVR and pulmonary exacerbation.  AFib, the   patient's rate remained under control with Cardizem 360 mg  Discharged on lasix and abx. She has finished abx and steroids.Denies chest pain, orthopnea, hemoptysis, fever, n/v/d.      Medications Prior to Update: 1)  Simvastatin 20 Mg  Tabs (Simvastatin) .... Take 1 Tab By Mouth At Bedtime.Marland KitchenMarland Kitchen 2)  Fish Oil 1000 Mg Caps (Omega-3 Fatty Acids) .... Take 2 Cap Sby Mouth Once Daily.Marland KitchenMarland Kitchen 3)  Synthroid 112 Mcg Tabs (Levothyroxine Sodium) .... Take One Tablet By Mouth Once Daily Per Hosp Dishcharge 4)  Miralax   Powd (Polyethylene Glycol 3350) .... Take 1 Capful Mixed in Water Two Times A Day... 5)  Wellbutrin Xl 150 Mg  Tb24 (Bupropion Hcl) .... As Directed By Drsteiner... 6)  Lexapro 20 Mg  Tabs (Escitalopram Oxalate) .... As Directed By Drsteiner... 7)  Zyprexa 2.5 Mg  Tabs (Olanzapine) .... As Directed By Drsteiner... 8)  Lithobid 300 Mg Cr-Tabs (Lithium Carbonate) .... Take 2 Capsules By Mouth Once Daily 9)  Deplin 15 Mg  Tabs (L-Methylfolate) .... Take 1 Tab Daily As Directed By Drsteiner... 10)  Vitamin D 1000 Unit Tabs (Cholecalciferol) .... Take 1 Cap By Mouth Once Daily... 11)  Pradaxa 150 Mg Caps (Dabigatran Etexilate Mesylate) .... One By Mouth Two Times A Day 12)  Metoprolol Succinate 25 Mg Xr24h-Tab (Metoprolol Succinate) .... Take 2 Tab By Mouth Once Daily... 13)  Proair Hfa 108 (90 Base) Mcg/act  Aers (Albuterol Sulfate) .... Inhale 2 Puffs Every 6 Hours As Needed For Wheezing...  Current Medications (verified): 1)  Proair Hfa 108 (90 Base) Mcg/act  Aers (Albuterol Sulfate) .... Inhale 2 Puffs Every 6 Hours As Needed For Wheezing... 2)  Simvastatin 20 Mg  Tabs (Simvastatin) .... Take 1 Tab By Mouth At Bedtime.Marland KitchenMarland Kitchen 3)  Fish Oil 1000 Mg Caps (Omega-3 Fatty Acids) .... Take 2 Cap Sby Mouth Once Daily.Marland KitchenMarland Kitchen 4)  Synthroid 112 Mcg Tabs (Levothyroxine Sodium) .... Take One Tablet By Mouth Once Daily Per Hosp Dishcharge 5)  Miralax   Powd (Polyethylene Glycol 3350) .... Take 1 Capful Mixed in Water Two Times A Day... 6)  Wellbutrin Xl 150 Mg  Tb24 (Bupropion Hcl) .... As Directed By Drsteiner... 7)  Lexapro 20 Mg  Tabs (Escitalopram Oxalate) .... As Directed By Drsteiner... 8)  Zyprexa 2.5 Mg  Tabs (Olanzapine) .... As Directed By Drsteiner... 9)  Lithobid 300 Mg Cr-Tabs (Lithium Carbonate) .... Take 2 Capsules By Mouth Once Daily 10)  Deplin 15 Mg Tabs (L-Methylfolate) .... Take 1 Tab  Daily As Directed By Drsteiner... 11)  Vitamin D 1000 Unit Tabs (Cholecalciferol) .... Take 1 Cap By Mouth Once Daily... 12)  Metoprolol Succinate 25 Mg Xr24h-Tab (Metoprolol Succinate) .... Take 1 Tablet By Mouth Two Times A Day 13)  Pradaxa 150 Mg Caps (Dabigatran Etexilate Mesylate) .... One By Mouth Two Times A Day  Allergies (verified): No Known Drug Allergies  Past History:  Past Medical History: Last updated: 09/21/2010 ASTHMATIC BRONCHITIS, ACUTE (ICD-466.0) PERSISTENT ATRIAL FIBRILLATION dx 08/23/10 CEREBROVASCULAR  DISEASE (ICD-437.9) VENOUS INSUFFICIENCY (ICD-459.81) HYPERLIPIDEMIA (ICD-272.4) HYPOTHYROIDISM (ICD-244.9) OVERWEIGHT (ICD-278.02) DIVERTICULOSIS OF COLON (ICD-562.10) COLONIC POLYPS (ICD-211.3) OSTEOARTHRITIS (ICD-715.90) OSTEOPENIA (ICD-733.90) DEPRESSION (ICD-311)  Past Surgical History: Last updated: 08/06/2010 tonsellectomy  Family History: Last updated: 2010-08-23 mother deceased age 70  from CHF  hx of arthritis father deceased age 40 heart problems 1 sibling alive age 72 with heart problems  Social History: Last updated: 08/06/2010 married and lives in [place] with spouse 4 children never smoked caffeine use  1 daily no alcohol use  Risk Factors: Smoking Status: never (08-23-2010)  Review of Systems      See HPI  Vital Signs:  Patient profile:   77 year old female Height:      68 inches Weight:      211.38 pounds BMI:     32.26 O2 Sat:      95 % on Room air Temp:     97.0 degrees F oral Pulse rate:   51 / minute BP sitting:   116 / 64  (left arm) Cuff size:   large  Vitals Entered By: Parke Poisson CNA/MA (November 26, 2010 2:33 PM)  O2 Flow:  Room air  Physical Exam  Additional Exam:  WD, Overweight, 77 y/o WF in NAD... she has a flat affect... GENERAL:  Alert & oriented; pleasant & cooperative... HEENT:  [place]/AT, EOM-wnl, PERRLA, EACs-clear, TMs-wnl, NOSE-clear, THROAT-clear & wnl. NECK:  Supple w/ fairROM; no JVD; normal carotid impulses w/o bruits; no thyromegaly or nodules palpated; no lymphadenopathy. CHEST:  Clear to P & A; without wheezes/ rales/ or rhonchi heard... HEART:  Irreg  without murmurs/ rubs/ or gallops detected... ABDOMEN:  Obese, soft & nontender; normal bowel sounds; no organomegaly or masses palpated... EXT: without deformities, mild arthritic changes; no varicose veins/ +venous insuffic/  tr-1+ edema.    Impression & Recommendations:  Problem # 1:  ATRIAL FIBRILLATION (ICD-427.31) Recent flare with AFIB RVR  w/associated CHF and pulmonary edema  Improved with diuresis and rate control.  does appear to have some peripheral edema but not significant w/ stable vital signs weight. will check labs and bump up lasix briefly Plan:   Take extra Lasix 20mg  once daily for 2 days then back to 1 daily  Keep legs elevated.  Low salt diet  I will call with lab and xray results.  follow up Dr. Lenna Gilford in 2 weeks  Please contact office for sooner follow up if symptoms do not improve or worsen   Problem # 2:  ASTHMATIC BRONCHITIS, ACUTE (ICD-466.0) Recent flare w/ possible CAP   xray pending no further abx for now.  Medications Added to Medication List This Visit: 1)  Metoprolol Succinate 25 Mg Xr24h-tab (Metoprolol succinate) .... Take 1 tablet by mouth two times a day  Other Orders: T-2 View CXR (71020TC) TLB-BMP (Basic Metabolic Panel-BMET) (99991111) TLB-BNP (B-Natriuretic Peptide) (83880-BNPR) Est. Patient Level IV [id number])  Patient Instructions: 1)  Take extra Lasix 20mg  once daily for 2 days then back to 1 daily  2)  Keep legs elevated.  3)  Low salt diet  4)  I will call with lab and xray results.  5)  follow up Dr. Lenna Gilford in 2 weeks  6)  Please contact office for sooner follow up if symptoms do not improve or worsen

## 2010-12-06 NOTE — Progress Notes (Signed)
Summary: 2 wk f/u   Phone Note Call from Patient Call back at Home Phone [phone]   Caller: Spouse Call For: Kamya Watling Summary of Call: spouse requests that pt's 2 wk f/u w/ dr Lenna Gilford be fri 12/14/10. ( i advised that the f/u is scheduled for 12/19/10 but he says he had "specifically" asked that it be on a fri). pls advise Initial call taken by: Cooper Render, CNA,  November 27, 2010 8:51 AM  Follow-up for Phone Call        pts appt for 3-21 was cancelled--unsure if it was done by pts spouse----ok to add pt on friday 3-16 at 2pm.  thanks [place]  November 28, 2010 9:15 AM   pt set for 3-16 at 2 pm. pt aware. [place] Bing CMA  November 28, 2010 10:22 AM

## 2010-12-06 NOTE — Progress Notes (Signed)
Summary: give acute ov for 2/27<<<OV sch w/ TP  Phone Note Other Incoming   Caller: Daughter---Robin Summary of Call: dc'ed 1 week ago for pna and chf (daugher an Engineer, petroleum). Today, fluid in ankles (giave extra lasix 1h ago), littlbe bit more dyspneic, bases on auscultation reveal diminished breath sounds. Finished abx tuesday avvelox. No fever. Not blue. A bit dimihshed on spirometry.  Dauhger wants to know if to extend antibiotics or give more lasix. BP good  REC: Watch closely. Check temp, vital signs, ROV 11/26/2010 urgent visit or if any acute change to call Initial call taken by: Brand Males MD,  November 25, 2010 4:52 PM  Follow-up for Phone Call        Per patient, she is scheduled to see TP today at 2:15pm. She says her ankle edema has improved due to increased lasix and her breathing has also improved but she will keep OV today with TP for further evaluation. Follow-up by: Francesca Jewett CMA,  November 26, 2010 9:10 AM

## 2010-12-07 DIAGNOSIS — I5033 Acute on chronic diastolic (congestive) heart failure: Secondary | ICD-10-CM

## 2010-12-07 LAB — BASIC METABOLIC PANEL
BUN: 24 mg/dL — ABNORMAL HIGH (ref 6–23)
CO2: 31 mEq/L (ref 19–32)
Chloride: 104 mEq/L (ref 96–112)
GFR calc non Af Amer: 38 mL/min — ABNORMAL LOW (ref >60)
Glucose, Bld: 121 mg/dL — ABNORMAL HIGH (ref 70–99)
Potassium: 4.1 mEq/L (ref 3.5–5.1)

## 2010-12-07 NOTE — Consult Note (Signed)
NAMEADANNAYA, Sonya Kaufman               ACCOUNT NO.:  [id number]  MEDICAL RECORD NO.:  [id number]           PATIENT TYPE:  I  LOCATION:  [id number]                         FACILITY:  [facility]  PHYSICIAN:  Fay Records, MD, FACCDATE OF BIRTH:  [birth year]  DATE OF CONSULTATION:  11/12/2010 DATE OF DISCHARGE:                                CONSULTATION   PRIMARY CARE PHYSICIAN:  Deborra Medina. Lenna Gilford, MD  CARDIOLOGIST:  Thompson Grayer, MD  CHIEF COMPLAINT:  Atrial fibrillation as well as elevated cardiac enzymes.  HISTORY OF PRESENT ILLNESS:  Sonya Kaufman is a 77 year old female with a history of AFib diagnosed in November 2011.  It has been persistent. She is on Toprol XL 25 mg 2 tablets daily for rate control and Pradaxa for anticoagulation.  She was out in the wind while riding in a car on November 10, 2010.  After that she developed increasing cough as well as increasing dyspnea on exertion and shortness of breath.  When her symptoms worsened, she came to the emergency room yesterday and was found to be significantly hypertensive as well with blood pressure initially of 195/105.  She was treated with antibiotics for possible community-acquired pneumonia as well as IV Lasix for possible pulmonary edema.  She had cardiac enzymes cycled and there was some elevation, so Cardiology was asked to evaluate her.  Sonya Kaufman never has chest pain.  She has chronic dyspnea on exertion, which has been worse recently and developed shortness of breath at rest as well over the last 24 hours.  Her symptoms have improved since admission, but she is still short of breath with minimal exertion even on O2.  She is not on oxygen at home.  She never has palpitations and she has no awareness of an irregular heart rate.  She has no history of presyncope or syncope.  Currently after diuresis, her I's and O's are negative by greater than a liter and her condition is improved.  PAST MEDICAL HISTORY: 1. Atrial  fibrillation, persistent, diagnosed in November 2011 with     rate control preferred. 2. Anticoagulation with Pradaxa, started by Dr. Rayann Heman in December     2011.  Coumadin versus Pradaxa were discussed with the patient and     the decision was made to go with Pradaxa. 3. Hyperlipidemia. 4. Obesity. 5. Status post echocardiogram September 07, 2010, showing an EF of 55%     to 60%, no wall motion abnormalities, no significant bowel     problems. 6. History of asthma. 7. Hypothyroidism. 8. Depression with a history of a suicide attempt by overdose in 2003     and admission to [facility] in 2008 for suicidal ideation. 9. History of diverticulosis. 10.History of cerebrovascular disease. 11.History of right patellar fracture. 12.Osteoarthritis.  PAST SURGICAL HISTORY:  She is status post tonsillectomy as well as colonoscopy with biopsy.  ALLERGIES:  No known drug allergies.  CURRENT MEDICATIONS: 1. Ventolin 2.5 mg and Atrovent 0.5 mg q.6 h. 2. Biotin b.i.d. 3. Aspirin 81 mg a day. 4. Zithromax 500 mg daily. 5. Wellbutrin 300 mg a day. 6. Rocephin 1 g IV  daily. 7. Pradaxa 150 mg q.12 h. 8. Cardizem CD 180 mg a day. 9. Lexapro 20 mg daily. 10.Pepcid 20 mg b.i.d. 11.Lasix 40 mg IV q.12 h. 12.Synthroid 125 mcg daily. 13.Lithium 300 mg daily. 14.Solu-Medrol 60 mg IV q.6 h. 15.Toprol XL 50 mg a day. 16.Zyprexa 3.75 mg daily.  SOCIAL HISTORY:  She lives in [place] with her husband and son.  Her son has cerebral palsy and requires a great deal of care.  She is a Agricultural engineer.  She has no history of alcohol, tobacco, or drug abuse.  She is active around the house, but does not exercise.  Her activity has been limited because of her dyspnea on exertion.  FAMILY HISTORY:  Her mother died at 1 with heart failure and her father died at 67 with coronary artery disease.  She has at least 1 sibling with heart disease.  REVIEW OF SYSTEMS:  She has insomnia.  She has not had any  fevers, chills, or sweats.  Her respiratory issues are described above and she also has occasional PND, but no lower extremity edema.  She has coughing, but the cough has been nonproductive and she has had wheezing as well.  She has had right wrist pain since she fell and broke her wrist in January and she has chronic back pain as well as some other arthralgias.  She has noted increased reflux symptoms on Pradaxa.  Full 14-point review of systems is otherwise negative except as stated in the HPI.  PHYSICAL EXAMINATION:  VITAL SIGNS:  Temperature 97.5, blood pressure 130/71, heart rate 90, respiratory rate 22, O2 saturation 97% on 3 L. GENERAL:  She is a well-developed elderly white female who is short of breath unless she is at rest.  She is on O2. HEENT:  Normal. NECK:  There is no lymphadenopathy, thyromegaly, bruits, and JVD is difficult to assess, but is less than 10 cm. CV:  Heart is irregular in rate and rhythm with an S1 and S2 and no significant murmur, rub or gallop is noted.  Distal pulses are intact in all 4 extremities. LUNGS:  She has decreased breath sounds in the bases, but no crackles or wheeze are noted. SKIN:  No rashes or lesions are noted. ABDOMEN:  Soft and nontender with active bowel sounds. EXTREMITIES:  There is no cyanosis, clubbing or edema noted. MUSCULOSKELETAL:  There is no joint deformity or effusions and no spine or CVA tenderness. NEURO:  She is alert and oriented.  Cranial nerves II-XII grossly intact.  Chest x-ray shows left greater than right edema or infiltrate, stable mild cardiomegaly.  EKG is atrial fib, rate 101 with no acute ischemic changes.  Laboratory values, CK-MB 81/4.0, then 79/3.9, then 82/4.0 with troponin- I 0.19, then 0.15, and then 0.11.  Hemoglobin A1c 5.7, BMP 383, TSH 0.326.  Sodium 141, potassium 4.0, chloride 106, CO2 of 28, BUN 22, creatinine 1.32, glucose 171, lithium 0.71.  Hemoglobin 14.4, hematocrit 43.6, WBC is 9.6,  and platelets 176.  IMPRESSION:  Sonya Kaufman was seen today by Dr. Harrington Challenger, the patient evaluated and the data reviewed.  She presented with increased shortness of breath and was found to be in atrial fibrillation, rapid ventricular response as well as asymmetric edema on chest x-ray.  She is now on diltiazem 180 as well as Lasix IV b.i.d., steroids, nebulizers, and antibiotics.  She has already improved.  Her creatinine was 1.16 on admission and now 1.32.  RECOMMENDATIONS: 1. Elevated troponin:  We are not convinced  that this is primarily     ischemia.  It is most likely demand in the setting of AFib/RVR as     well as pulmonary exacerbation.  She should be continued on     telemetry and continue aspirin. 2. Atrial fibrillation:  Her diltiazem will be changed to 60 mg q.6 h.     and can be easily up titrated for better rate control.  She can be     continued on metoprolol as well.  She was started on Pradaxa for     anticoagulation and this should be continued as well.  Her chest x-     ray probably reflects diastolic dysfunction as we would expect.  We     will hold her Lasix until she is reassessed in a.m. by BMET and by     exam.     Sonya Ferries, PA-C   ______________________________ Fay Records, MD, [facility]    RB/MEDQ  D:  11/12/2010  T:  11/12/2010  Job:  [id number]  Electronically Signed by Sonya Ferries PA-C on 11/21/2010 01:46:49 PM Electronically Signed by Dorris Carnes MD [place] on 12/06/2010 02:14:15 PM

## 2010-12-07 NOTE — H&P (Signed)
[name], Kaufman               ACCOUNT NO.:  [id number]  MEDICAL RECORD NO.:  [id number]           PATIENT TYPE:  E  LOCATION:  WLED                         FACILITY:  [facility]  PHYSICIAN:  Sonya Kaufman, Sonya Kaufman DATE OF BIRTH:  03/23/1931  DATE OF ADMISSION:  12/03/2010 DATE OF DISCHARGE:                             HISTORY & PHYSICAL   CARDIOLOGIST:  Sonya Kaufman, Sonya Kaufman.  PRIMARY DOCTOR:  Sonya Kaufman. Sonya Kaufman, Sonya Kaufman  CHIEF COMPLAINT:  Shortness of breath, lower extremity swelling, and weight gain.  HISTORY OF PRESENT ILLNESS:  This is a 77 year old white female with a history of diastolic heart failure, atrial fibrillation and a recent admission for volume overload and atrial fibrillation who presents with 2-3 days of progressive shortness of breath, wheezing, increased lower extremity edema, and weight gain.  She was recently seen by her primary care physician in the office, which suggested doubling her dose of Lasix on a daily basis.  This helped for 2 days, but over the weekend she noticed an 8-pound weight gain in 1 night with worsening shortness of breath.  She states that she had been compliant with her diet and her medications and overall denies additional chest pain, palpitations, presyncope.  PAST MEDICAL HISTORY: 1. Atrial fibrillation, currently on Pradaxa. 2. Hyperlipidemia. 3. Diastolic dysfunction. 4. Asthma. 5. Hypothyroidism. 6. Depression with history of suicidal ideations. 7. History of cerebrovascular disease.  ALLERGIES:  No known drug allergies.  MEDICATIONS ON ADMISSION: 1. Cardizem 360 mg daily. 2. Synthroid 120 mcg daily. 3. Avelox 400 mg daily. 4. Lasix 20 mg now twice daily for the last week, before 20 mg daily. 5. Potassium chloride 20 mEq daily. 6. Bupropion 300 mg daily. 7. Methylfolate 15 mg daily. 8. Fish oil daily. 9. Lexapro 20 mg daily. 10.Lithium 600 mg daily. 11.Metoprolol 50 mg daily. 12.MiraLax 17 g daily. 13.Pradaxa 150 mg twice  daily. 14.ProAir 2 puffs q.6 as needed. 15.Zocor 20 mg daily. 16.Vitamin D3 daily. 17.Zyprexa 2.5 mg daily.  SOCIAL HISTORY:  She lives in [place] with her husband and her son. She is currently retired.  She does not smoke.  FAMILY HISTORY:  Her mother had CHF in her 25s and her father had coronary artery disease diagnosed in 53s.  Otherwise, no premature coronary artery disease.  REVIEW OF SYSTEMS:  All 14 systems were reviewed were negative except as mentioned detail in the HPI.  PHYSICAL EXAM:  VITAL SIGNS:  Blood pressure is 136/61, respiratory rate is 22.  Her pulse is 57.  She is saturating 94% on 5 L nasal cannula. GENERAL:  She is a 77 year old elderly appearing, obese white female in mild acute distress. HEENT:  Moist mucous membranes.  Pupils equal, round, react to light and accommodation.  Anicteric sclerae. NECK:  She has 15 cm jugular venous pulsations to the angle of the mandible with respiratory variation. CARDIOVASCULAR:  Regular rate and rhythm.  No murmurs, rubs, or gallops. LUNGS:  Bibasilar fine crackles. SKIN:  Warm, dry, intact.  No rashes. ABDOMEN:  Slight distention with a slight fluid wave. EXTREMITIES:  She has 2+ lower extremity edema to the knees. NEUROLOGIC:  Alert and oriented x3.  Cranial nerves II through XII grossly intact.  No focal neurologic deficits. PSYCHIATRIC:  Mood and affect are appropriate.  RADIOLOGY:  Chest x-ray showed cardiomegaly with mild congestive heart failure.  EKG showed ectopic atrial rhythm with a rate of 59 beats per minute with septal Q-waves.  LABORATORY DATA:  White blood cell count is 8.7, hematocrit is 42.8. Potassium is 4.0, creatinine is 1.8.  BNP is 398.  Troponin is less than 0.05.  INR is 1.32.  ASSESSMENT AND PLAN:  This is a 77 year old female with a history of atrial fibrillation and diastolic dysfunction with preserved ejection fraction who presents with volume overload. 1. Diastolic heart failure,  acute-on-chronic.  There is concern for     medical and diet noncompliance in context of rapidly waxing and     waning symptoms over a few days.  We will decrease her calcium-     channel blocker and diurese her with IV Lasix to a goal of 2 L per     day.  We will keep her on a low-salt diet while she is here and     consider cardiac rehab as an outpatient. 2. Atrial fibrillation.  She is now in ectopic atrial rhythm.  We will     continue her current Pradaxa and monitor her     rhythm with her decreased AV nodal blockade regimen. 3. Acute renal failure.  Her creatinine is slightly above baseline.     We will monitor her renal function with diuresis.     Sonya Kaufman, Sonya Kaufman     BHH/MEDQ  D:  12/03/2010  T:  12/03/2010  Job:  Sonya Kaufman:[id number]  Electronically Signed by Matthew Saras Sonya Kaufman on 12/07/2010 12:54:59 PM

## 2010-12-11 ENCOUNTER — Telehealth: Payer: Self-pay | Admitting: Internal Medicine

## 2010-12-11 ENCOUNTER — Telehealth (INDEPENDENT_AMBULATORY_CARE_PROVIDER_SITE_OTHER): Payer: Self-pay | Admitting: *Deleted

## 2010-12-11 NOTE — Progress Notes (Signed)
Summary: wants to know if they should bring pt in for c xray, cough and w  Phone Note Call from Patient   Caller: Daughter/Robin Elgie Congo Call For: nadel Summary of Call: patients daughter [name] phoned stated that her mother is retaining alot of fluid and her cardiologist told them to increase her lasix. But today her mother has developed a dry cough and she was wezzing yesterday and she used her Proair. Shirlean Mylar states that she gets winded easy and wonders if they should bring her in for a chest xray. Shirlean Mylar can be reached at [phone] Initial call taken by: Ozella Rocks,  December 03, 2010 5:12 PM  Follow-up for Phone Call        Spoke with pt's daughter.  She states that pt has developed a dry hacky cough and wheezing over the past couple days.  She is concerned that she needs cxr and may have PNA.  I advised needs ov- sched appt with TP for tommorrow at 2:30 pm and advised to take her to ED sooner if needed.  Daughter verbalized understanding.  Follow-up by: Tilden Dome,  December 03, 2010 5:43 PM

## 2010-12-11 NOTE — Progress Notes (Signed)
Summary: high bp last week>pt in [facility] bed [facility] 1408  Phone Note From Other Clinic   Caller: kay w/ [facility] Call For: Wanisha Shiroma Summary of Call: nurse [facility]/ [facility] says she saw pt last fri 11/30/10 for "last home vist with pt". pt's bp was 170 / 90. pt took her bp meds at 8am and nurse checked bp at 11am. caller says pt had no symptims- otherwise fine. nurse "meant to call fri but saw by her note that she "hadn't checked this off her list". (her words).  kay's # is [id number] Initial call taken by: Cooper Render  Follow-up for Phone Call        LMTCBx1 with Zigmund Daniel. Gerber Bing CMA  December 04, 2010 3:37 PM  Spoke with Zigmund Daniel and she states that sh saw the pt on Friday and her BP was 170/90. She states pt denied any headache or vision disturbances. She states she forgot to call on Friday about this issue. She also states that [facility] has since discharged the pt, so I called the pt to speak to her and her husband informed me that she was admitted to [facility] hospital ysterday due to fluid retention and increased SOB. I advsied I will forward message to Dr. Lenna Gilford to be aware that the pt was admitted. Pt in bed# 1408. Umapine Bing CMA  December 04, 2010 3:53 PM

## 2010-12-11 NOTE — Progress Notes (Signed)
Summary: need to reschedule appt  Phone Note Outgoing Call   Summary of Call: called and lmomtcb for pt ---pt has appt on 3-16 at 2pm and SN will not be in the office that day so we need to reschedule appt for 3-15 at 2pm and i have already moved appt...just need to confirm this is ok with pt [name] CMA  December 05, 2010 2:51 PM   Follow-up for Phone Call        Patients spouse Mikka Vosburgh called back stated that Ms Reichman is in [facility] 1408 they dont know when she will be released but she will have to follow up with Dr Joylene Grapes her cardiologist. It is fine to move the appt to 3/15 but if he needs to he will call to cancel or reschedule. he can be reached at [phone].Ozella Rocks  December 05, 2010 5:15 PM

## 2010-12-11 NOTE — Progress Notes (Signed)
Summary: pt retaining fluid  Medications Added FUROSEMIDE 20 MG TABS (FUROSEMIDE) one by mouth once daily or as directed       Phone Note Call from Patient   Caller: Daughter Shirlean Mylar [phone] Reason for Call: Talk to Nurse Summary of Call: pt has appt 3-30, dtr says pt retaining fluid and needs appt sooner Initial call taken by: Lorenda Hatchet,  December 03, 2010 9:00 AM  Follow-up for Phone Call        spoke with daughter will discuss with doctor and call her back Janan Halter, RN, BSN  December 03, 2010 5:15 PM advised to increase Furosemide to two times a day until wed when I can ask doctor Janan Halter, RN, BSN  December 03, 2010 5:16 PM  Additional Follow-up for Phone Call Additional follow up Details #1::        left daughter a message she could call me if she needed me to do anything else for her and her mom.  It looked lkie she was in [facility] hospital on 12/04/10 Janan Halter, RN, BSN  December 06, 2010 5:59 PM    New/Updated Medications: FUROSEMIDE 20 MG TABS (FUROSEMIDE) one by mouth once daily or as directed Prescriptions: FUROSEMIDE 20 MG TABS (FUROSEMIDE) one by mouth once daily or as directed  #45 x 6   Entered by:   Janan Halter, RN, BSN   Authorized by:   Thompson Grayer, MD   Signed by:   Janan Halter, RN, BSN on 12/03/2010   Method used:   Faxed to ...       [facility], [place] (mail-order)       [address]  [address]       Ph: [phone]       Fax: [phone]   RxIDBU:[id number]

## 2010-12-13 ENCOUNTER — Other Ambulatory Visit: Payer: Self-pay | Admitting: Internal Medicine

## 2010-12-13 ENCOUNTER — Encounter: Payer: Self-pay | Admitting: Internal Medicine

## 2010-12-13 ENCOUNTER — Ambulatory Visit: Payer: Medicare Other | Admitting: Pulmonary Disease

## 2010-12-13 ENCOUNTER — Ambulatory Visit (INDEPENDENT_AMBULATORY_CARE_PROVIDER_SITE_OTHER): Payer: Medicare Other | Admitting: Internal Medicine

## 2010-12-13 DIAGNOSIS — N189 Chronic kidney disease, unspecified: Secondary | ICD-10-CM | POA: Insufficient documentation

## 2010-12-13 DIAGNOSIS — N289 Disorder of kidney and ureter, unspecified: Secondary | ICD-10-CM

## 2010-12-13 DIAGNOSIS — I4891 Unspecified atrial fibrillation: Secondary | ICD-10-CM

## 2010-12-13 DIAGNOSIS — I5032 Chronic diastolic (congestive) heart failure: Secondary | ICD-10-CM | POA: Insufficient documentation

## 2010-12-13 DIAGNOSIS — E785 Hyperlipidemia, unspecified: Secondary | ICD-10-CM

## 2010-12-13 DIAGNOSIS — E039 Hypothyroidism, unspecified: Secondary | ICD-10-CM

## 2010-12-13 LAB — CBC WITH DIFFERENTIAL/PLATELET
Basophils Absolute: 0 10*3/uL (ref 0.0–0.1)
Basophils Relative: 0.5 % (ref 0.0–3.0)
Eosinophils Absolute: 0.1 10*3/uL (ref 0.0–0.7)
HCT: 44.3 % (ref 36.0–46.0)
Hemoglobin: 15 g/dL (ref 12.0–15.0)
Lymphs Abs: 2.4 10*3/uL (ref 0.7–4.0)
MCHC: 33.8 g/dL (ref 30.0–36.0)
MCV: 93.5 fl (ref 78.0–100.0)
Neutro Abs: 4.2 10*3/uL (ref 1.4–7.7)
RDW: 14.4 % (ref 11.5–14.6)

## 2010-12-13 LAB — BASIC METABOLIC PANEL
BUN: 25 mg/dL — ABNORMAL HIGH (ref 6–23)
Calcium: 9.9 mg/dL (ref 8.4–10.5)
Creatinine, Ser: 1.8 mg/dL — ABNORMAL HIGH (ref 0.4–1.2)

## 2010-12-13 LAB — TSH: TSH: 5.8 u[IU]/mL — ABNORMAL HIGH (ref 0.35–5.50)

## 2010-12-14 ENCOUNTER — Ambulatory Visit: Payer: Medicare Other | Admitting: Pulmonary Disease

## 2010-12-15 ENCOUNTER — Encounter: Payer: Self-pay | Admitting: Internal Medicine

## 2010-12-17 LAB — CONVERTED CEMR LAB: Lithium Lvl: 0.92 meq/L (ref 0.80–1.40)

## 2010-12-18 NOTE — Progress Notes (Signed)
   Pt's Daughter Dropped off FMLA to care for her Mom.Sent to [facility] for Completion  [facility]  December 11, 2010 10:41 AM

## 2010-12-18 NOTE — Progress Notes (Signed)
Summary: Question about B/P medication   Phone Note Call from Patient Call back at [phone]   Caller: Daughter/ Janne Lab Summary of Call: Question about the pt new B/P medication making pt funny Initial call taken by: Delsa Sale,  December 11, 2010 2:55 PM  Follow-up for Phone Call        spoke with pt's other daughter  Told her I did not think that the Lisinopril is the cause of her Mom not feeling well. It is going to take  to recover from hospital and if she wants she can break her Furosemide in 1/2 and take 20mg  in the am and 20mg  at 2pm.  She will keep her follow up on [place], RN, BSN  December 11, 2010 3:38 PM

## 2010-12-18 NOTE — Progress Notes (Signed)
Summary: status of care. fmla paperwork    Phone Note Call from Patient Call back at Home Phone [phone]   Caller: Daughter- robin bass [phone]- cell [phone] Reason for Call: Talk to Nurse Summary of Call: status of care. fmla paperwork will be faxed over .  Initial call taken by: Neil Crouch,  December 11, 2010 8:51 AM  Follow-up for Phone Call        got voicemail at work called daughter on cell.  She faxed over [facility] paperwork today.  Spoke to Esto  We have the forms  Maudie Mercury in medical records will get them to [facility] in [place], RN, BSN  December 11, 2010 10:30 AM

## 2010-12-18 NOTE — Assessment & Plan Note (Signed)
Summary: follow up 3 mos/rs from bumplist/mt/rs per [facility] call 12/07/10 ...  Medications Added WELLBUTRIN XL 300 MG XR24H-TAB (BUPROPION HCL) once daily METOPROLOL SUCCINATE 50 MG XR24H-TAB (METOPROLOL SUCCINATE) Take one tablet by mouth daily PRADAXA 75 MG CAPS (DABIGATRAN ETEXILATE MESYLATE) one by mouth bid PRADAXA 75 MG CAPS (DABIGATRAN ETEXILATE MESYLATE) one by mouth two times a day PRADAXA 75 MG CAPS (DABIGATRAN ETEXILATE MESYLATE)  FUROSEMIDE 20 MG TABS (FUROSEMIDE) one by mouth two times a day POTASSIUM CHLORIDE CRYS CR 20 MEQ CR-TABS (POTASSIUM CHLORIDE CRYS CR) Take one tablet by mouth twice a day        Visit Type:  Follow-up Referring Provider:  Dr Lenna Gilford Primary Provider:  scott nadel,md   History of Present Illness: Ms Loo is a pleasant 77 yo WF with a depression, asthma, and recently diagnosed atrial fibrillation who presents today for EP follow-up.  Since last being seen by me, she presented and had was hospitalized for acute on chronic diastolic dysfunction at [facility].  She was diuresed and has done well.  Since discharge, her concern has been with inability to focus/ concentrate.  The patient's family spoke with her Psychiatrist who has suggested that she may have a supratherapeutic lithium level.  We are therefore asked to check a lithium level today.  Of note, she has mild increase in creatinine during her recent hospitalization for which her pradaxa was decreased to 75mg  two times a day.  Cardizem was also held due to asymptomatic pauses of < 3 seconds.  She feels that her SOB is much improved.  She denies CP, palpitations, orthopnea, edema, presyncope, or syncope.  She is otherwise without complaint today.  Current Medications (verified): 1)  Simvastatin 20 Mg  Tabs (Simvastatin) .... Take 1 Tab By Mouth At Bedtime.Marland KitchenMarland Kitchen 2)  Synthroid 112 Mcg Tabs (Levothyroxine Sodium) .... Take One Tablet By Mouth Once Daily Per Hosp Dishcharge 3)  Miralax   Powd (Polyethylene Glycol 3350)  .... Take 1 Capful Mixed in Water Two Times A Day... 4)  Wellbutrin Xl 300 Mg Xr24h-Tab (Bupropion Hcl) .... Once Daily 5)  Lexapro 20 Mg  Tabs (Escitalopram Oxalate) .... As Directed By Drsteiner... 6)  Zyprexa 2.5 Mg  Tabs (Olanzapine) .... As Directed By Drsteiner... 7)  Lithobid 300 Mg Cr-Tabs (Lithium Carbonate) .... Take 2 Capsules By Mouth Once Daily 8)  Metoprolol Succinate 50 Mg Xr24h-Tab (Metoprolol Succinate) .... Take One Tablet By Mouth Daily 9)  Pradaxa 150 Mg Caps (Dabigatran Etexilate Mesylate) .... One By Mouth Two Times A Day 10)  Proair Hfa 108 (90 Base) Mcg/act  Aers (Albuterol Sulfate) .... Inhale 2 Puffs Every 6 Hours As Needed For Wheezing... 11)  Furosemide 20 Mg Tabs (Furosemide) .... One By Mouth Once Daily or As Directed 12)  Potassium Chloride Crys Cr 20 Meq Cr-Tabs (Potassium Chloride Crys Cr) .... Take One Tablet By Mouth Twice A Day  Allergies: No Known Drug Allergies  Past History:  Past Medical History: ASTHMATIC BRONCHITIS, ACUTE (ICD-466.0) PERSISTENT ATRIAL FIBRILLATION dx 08/03/10 CEREBROVASCULAR DISEASE (ICD-437.9) VENOUS INSUFFICIENCY (ICD-459.81) HYPERLIPIDEMIA (ICD-272.4) HYPOTHYROIDISM (ICD-244.9) OVERWEIGHT (ICD-278.02) DIVERTICULOSIS OF COLON (ICD-562.10) COLONIC POLYPS (ICD-211.3) OSTEOARTHRITIS (ICD-715.90) OSTEOPENIA (ICD-733.90) DEPRESSION (123456) Chronic systolic dysfunction chronic renal insuffiency  Past Surgical History: Reviewed history from 08/06/2010 and no changes required. tonsellectomy  Social History: Reviewed history from 08/06/2010 and no changes required. married and lives in [place] with spouse 4 children never smoked caffeine use  1 daily no alcohol use  Review of Systems  All systems are reviewed and negative except as listed in the HPI.   Vital Signs:  Patient profile:   77 year old female Height:      68 inches Weight:      205 pounds Pulse rate:   98 / minute Pulse rhythm:   irregularly  irregular BP sitting:   118 / 82  (right arm)  Vitals Entered By: Margaretmary Bayley CMA (December 13, 2010 10:14 AM)  Physical Exam  General:  eldelry obese female, NAD Head:  normocephalic and atraumatic Eyes:  PERRLA/EOM intact; conjunctiva and lids normal. Mouth:  Teeth, gums and palate normal. Oral mucosa normal. Neck:  supple Lungs:  Clear bilaterally to auscultation and percussion. Heart:  iRRR, no m/r/g Abdomen:  Bowel sounds positive; abdomen soft and non-tender without masses, organomegaly, or hernias noted. No hepatosplenomegaly. Msk:  Back normal, normal gait. Muscle strength and tone normal. Extremities:  No clubbing or cyanosis. 1+ BLE edema Neurologic:  Alert and oriented x 3. Skin:  Intact without lesions or rashes. Psych:  flat affect today, depressed mood   EKG  Procedure date:  12/13/2010  Findings:      afib, V rate 80, nonspecific ST/T changes  Impression & Recommendations:  Problem # 1:  ATRIAL FIBRILLATION (ICD-427.31) rates appear to be controlled pradaxa recently decreased to 75mg  two times a day we will check creatinine clearance today we will also check CBC continue current rate control strategy  Problem # 2:  ACUTE ON CHRONIC DIASTOLIC HEART FAILURE (A999333) mild volume overload, but weight continues to improve continue lasix 20mg  two times a day increase lasix to 40mg  qam and 20mg  qpm if weight increases by more than 2 lbs 2 gram sodium restriction check BMET today  Problem # 3:  ACUTE KIDNEY FAILURE UNSPECIFIED (ICD-584.9) check BMET and lithium level today  Problem # 4:  DEPRESSION (ICD-311) per psychiatrists request, we will check TSH and lithium levels today  Other Orders: TLB-CBC Platelet - w/Differential (85025-CBCD) TLB-BMP (Basic Metabolic Panel-BMET) (99991111) TLB-TSH (Thyroid Stimulating Hormone) (84443-TSH) T-Lithium Level [id number])  Patient Instructions: 1)  Your physician recommends that you schedule a follow-up  appointment in: 3 months with Dr Rayann Heman 2)  Your physician recommends that you return for lab work today 3)  If your weight goes up you may increase your Furosemide to 40mg  in the am and 20mg  in the pm for 3 days then go back to 20mg  two times a day

## 2010-12-18 NOTE — Miscellaneous (Signed)
Summary: Care Plans/[facility]  Care Plans/[facility]   Imported By: Phillis Knack 12/11/2010 09:14:26  _____________________________________________________________________  External Attachment:    Type:   Image     Comment:   External Document

## 2010-12-19 ENCOUNTER — Telehealth: Payer: Self-pay | Admitting: Internal Medicine

## 2010-12-19 ENCOUNTER — Telehealth: Payer: Self-pay | Admitting: Pulmonary Disease

## 2010-12-19 ENCOUNTER — Ambulatory Visit: Payer: Medicare Other | Admitting: Pulmonary Disease

## 2010-12-19 NOTE — Telephone Encounter (Signed)
Called Shirlean Mylar back and left her a message that Dr Rayann Heman will not be here until Monday of next week to sign FMLA paperwork

## 2010-12-19 NOTE — Telephone Encounter (Signed)
Pt daughter calling re fmla papers will be coming today. Pt daughter wants to know if she can pick them up by Friday.

## 2010-12-19 NOTE — Telephone Encounter (Signed)
Leigh, please advise if you have seen her lab results thanks Charma Igo, [place]

## 2010-12-20 NOTE — Telephone Encounter (Signed)
Spoke with Nysa-request that we call her daughter [phone]; to discuss this with her. Will leave in triage until morning.

## 2010-12-20 NOTE — Telephone Encounter (Signed)
Per SN===tsh 5.80 normal to 5.50 and pt is on synthroid 129mcg daily----pt is also on lithium --SN records show we increased her to 175mcg daily 3-4 years ago---who cut her back to the 159mcg daily and why?  She will need an ov with SN (not a stat problem) to sort out her med issue and pt needs to bring all of her meds to this appt with SN

## 2010-12-21 NOTE — Telephone Encounter (Signed)
Per SN pt should be taking 168mcg daily and needs ov to discuss medications for first available. [place] Bing, CMA  Pt daughter advised pt should take 162mcg and appt set for 02-08-11 at 10:30am. [place] Bing, CMA

## 2010-12-21 NOTE — Telephone Encounter (Signed)
Spoke with Shirlean Mylar, pt daughter, and she states that the pt was decreased form 14mcg to 112 mcg during last hospital stay on Feb 12 through Feb 16th.  She states Dr. Rayann Heman rechecked her TSH because she has been having some depression and they think it may be due to tsh levels.  Shirlean Mylar wants to know what dose should the pt be on based on her last results. Shirlean Mylar will schedule an appt for the patient. [place] Bing, CMA

## 2010-12-25 ENCOUNTER — Other Ambulatory Visit: Payer: Self-pay | Admitting: Pulmonary Disease

## 2010-12-28 ENCOUNTER — Telehealth: Payer: Self-pay | Admitting: *Deleted

## 2010-12-28 ENCOUNTER — Ambulatory Visit: Payer: Self-pay | Admitting: Internal Medicine

## 2010-12-28 MED ORDER — SYNTHROID 125 MCG PO TABS: 125.0000 ug | ORAL_TABLET | Freq: Every day | ORAL | Status: DC

## 2010-12-28 NOTE — Telephone Encounter (Signed)
Received fax from [facility]---pts synthroid was increased from 112 mcg to 125--pt requesting refills of this med.  This has been sent to the pharmacy per SN ok.

## 2011-01-07 ENCOUNTER — Telehealth: Payer: Self-pay | Admitting: Internal Medicine

## 2011-01-07 NOTE — Telephone Encounter (Signed)
I called Robin back (patient's daughter)and she advised me that her Mom's heart rate is staying up at 124. BP yesterday was 110/60  sitting and 80/55 standing so she cut back on her Lisinopril from 5 to 2.5 mg and she states the [place] feels better today but the heart rate is still up. Advised will speak with the DOD and call her back with a recommendation. Dr.McLean (DOD) aware of above. He would like the patient to stop the Lisinopril and increase the metoprolol to 50mg  in the am and 25mg  in the pm and keep at the XR formula of metoprolol. Robin verbalized understanding of the medication change and will call back if she has any more issues rate control of the atrial fib. Will will Dr.Allred know that she called.

## 2011-01-07 NOTE — Telephone Encounter (Signed)
Pt states that her mothers heart rate is running higher than normal. Daughter Shirlean Mylar would like to talk to nurse about this

## 2011-01-09 NOTE — Discharge Summary (Signed)
Sonya Kaufman, Sonya Kaufman               ACCOUNT NO.:  [id number]  MEDICAL RECORD NO.:  [id number]           PATIENT TYPE:  I  LOCATION:  [id number]                         FACILITY:  [facility]  PHYSICIAN:  Ludwig Lean. Doreatha Lew, M.D.DATE OF BIRTH:  1930-10-15  DATE OF ADMISSION:  12/03/2010 DATE OF DISCHARGE:  12/07/2010                              DISCHARGE SUMMARY   PRIMARY CARDIOLOGIST:  Thompson Grayer, MD  PRIMARY CARE PHYSICIAN:  Deborra Medina. Lenna Gilford, MD  DISCHARGE DIAGNOSES: 1. Acute-on-chronic diastolic congestive heart failure, resolved.      (a) Preserved ejection fraction, ejection fraction of 55% per echo     September 07, 2010. 2. Persistent atrial fibrillation, rate controlled.       (a) Anticoagulation with Pradaxa, dose decreased this admission     secondary to renal insufficiency.       (b) Diltiazem discontinued this admission secondary to slow      ventricular response.  SECONDARY DIAGNOSES: 1. Hyperlipidemia.2. Obesity. 3. Asthma. 4. Hypothyroidism. 5. Depression with a history of suicide attempt by overdose in 2003 as     well as an admission to [facility] in 2008 for suicidal     ideation. 6. History of cerebrovascular disease. 7. Osteoarthritis.  PROCEDURE AND DIAGNOSTICS:  Procedure/diagnostics performed during hospitalization, chest x-ray on December 03, 2010, demonstrating cardiomegaly with mild interstitial edema and vascular congestion as well as trace bilateral effusions.  REASON FOR HOSPITALIZATION:  This is a 77 year old female with the above- stated problem list, who presented to the [facility] Emergency Department with complaints of 2 to 3 days of progressive shortness of breath, wheezing, increased lower extremity edema and weight gain.  She had recently seen in her primary care office where her Lasix was doubled, did help shortly, but the patient had an 8-pound weight gain overnight and presented to the Emergency Department for further evaluation.  The  patient's initial BNP was noted to be 398.  She was admitted by the Cardiology Service for IV diuresis.  She was volume overloaded on exam.  Initial EKG showed ectopic atrial rhythm with a rate of 69 beats per minute with septal Q-waves.  Chest x-ray did show mild congestive heart failure.  HOSPITAL COURSE:  The patient responded well to IV diuresis with an initial output of greater than 2 liters.  The patient's symptoms did begin to improve, although her recheck BNP had increased to 589.  The day of discharge, her BNP decreased to 332.  Her creatinine was watched closely, which initially was 1.8 on admission with diuresis.  Creatinine at discharge is 1.34.  The patient's initial weight was not recorded upon admission to the Emergency Department but discharge weight is 95.48 kg.  Again, the patient is clinically improved and able to ambulate without difficulty in her lung exam, it is clear without rales or rhonchi.  It was noted the patient had several episodes of pauses noted on telemetry, as the greatest being 2.2 seconds.  The patient is asymptomatic during these episodes.  It was noted that the patient had slow ventricular response with her atrial fibrillation and with the pauses, the patient's diltiazem  had been discontinued.  This will be monitored closely as an outpatient.  The patient did tolerate this well and on day of discharge, the patient's ventricular response was 76.  It was noted that the patient was in acute renal failure with her creatinine slightly above baseline.  Therefore, this was monitored closely during admission.  Her Pradaxa dose was decreased slightly with the increase in her Lasix.  There is also a low-dose ACE inhibitor added at this time.  The patient's creatinine tolerated this and again on day of discharge was 1.34.  The patient's potassium was also closely monitored with the increase in her Lasix dose.  Her potassium regimen has also been  increased.  On day of discharge, Dr. Doreatha Lew evaluated the patient and noted her stable for home with close outpatient followup.  Discharge instructions and followup plans have been discussed with the patient and she voiced understanding.  Discharge labs were sodium of 139, potassium 4.1, BUN 24, creatinine 1.34, BNP 332.  DISCHARGE MEDICATIONS: 1. Lisinopril 5 mg daily. 2. Lasix 40 mg daily. 3. Potassium chloride 20 mEq daily. 4. Pradaxa 75 mg twice daily. 5. Bupropion XL 300 mg daily. 6. Levothyroxine 112 mcg daily. 7. Lexapro 20 mg daily. 8. Lithium carbonate 300 mg 2 capsules daily. 9. Metoprolol XL succinate 50 mg 1 tablet daily. 10.MiraLax 17 gm daily. 11.ProAir inhaler 2 puffs every 6 hours as needed for shortness of     breath. 12.Simvastatin 20 mg daily. 13.Zyprexa 2.5 mg 1-1/2 tablet every evening. 14.Please stop taking diltiazem.  FOLLOWUP PLANS AND APPOINTMENTS: 1. The patient will follow up with Dr. Rayann Heman on March 15 at 9:45 a.m. 2. The patient is to record daily weights and call the office for any     weight gain greater than 3 to 4 pounds. 3. The patient is to watch fluid intake carefully.4. The patient is to continue low-sodium heart-healthy diet. 5. The patient is to increase activity slowly. 6. The patient's discharge is greater than 30 minutes of physician and     physician extender time.  DURATION OF DISCHARGE: Greater than 30 min with physician and physician extender time   Cecille Amsterdam, PA-C   ______________________________ Ludwig Lean. Doreatha Lew, M.D.    NB/MEDQ  D:  12/07/2010  T:  12/07/2010  Job:  [id number]  cc:   Thompson Grayer, MD [place]. [address] [place] [address]  Scott M. Lenna Gilford, [place] [address]  Electronically Signed by Pennie Rushing P.A. on 12/28/2010 02:07:17 PM Electronically Signed by Romeo Apple M.D. on 01/09/2011 11:19:42 AM

## 2011-01-11 ENCOUNTER — Encounter: Payer: Self-pay | Admitting: Internal Medicine

## 2011-01-11 ENCOUNTER — Ambulatory Visit (INDEPENDENT_AMBULATORY_CARE_PROVIDER_SITE_OTHER): Payer: Medicare Other | Admitting: Internal Medicine

## 2011-01-11 DIAGNOSIS — I4892 Unspecified atrial flutter: Secondary | ICD-10-CM

## 2011-01-11 DIAGNOSIS — I5033 Acute on chronic diastolic (congestive) heart failure: Secondary | ICD-10-CM

## 2011-01-11 NOTE — Patient Instructions (Addendum)
Your physician has recommended that you have a Cardioversion (DCCV). Electrical Cardioversion uses a jolt of electricity to your heart either through paddles or wired patches attached to your chest. This is a controlled, usually prescheduled, procedure. Defibrillation is done under light anesthesia in the hospital, and you usually go home the day of the procedure. This is done to get your heart back into a normal rhythm. You are not awake for the procedure. Please see the instruction sheet given to you today.  Your physician recommends that you continue on your current medications as directed. Please refer to the Current Medication list given to you today.

## 2011-01-11 NOTE — Assessment & Plan Note (Signed)
As above.

## 2011-01-11 NOTE — Telephone Encounter (Signed)
DAUGHTER AWARE WILL DISCUSS WITH DR Caryl Comes AND WILL CAL BACK WITH PLAN .Adonis Housekeeper

## 2011-01-11 NOTE — Telephone Encounter (Signed)
Pt seen in the office by Dr Caryl Comes and scheduled for DCCV on 01/16/11.

## 2011-01-11 NOTE — Assessment & Plan Note (Signed)
Patient presents with persistent rapid atrial flutter. It has been difficult to control her heart rate. I suggest that we undertake cardioversion. This will also allow [place] to understand whether sinus rhythm in the setting of her diastolic heart disease might be associated with improved exercise performance. I will do for this ultimately to Dr. Greggory Brandy will be back on Monday. We will tentatively schedule her for cardioversion next week.

## 2011-01-11 NOTE — Progress Notes (Signed)
HPI  Sonya Kaufman is a 77 y.o. female Seen as an add-on today.  Her daughter noted that her heart rate was persistently 120s. Beta blockers were initiated without change. She has a history of diastolic heart failure with an ejection fraction by echo 2011-55-65%. Left atrial dimension Was noted as moderately dilated although the measurement was only 33. She has had 2 hospitalizations recently for fluid overload. She is on Pradaxa recently down titrated to 75 mg twice daily.  She is largely unaware of her tachycardia palpitations. She does have exercise intolerance  Past Medical History  Diagnosis Date  . Acute bronchitis   . Atrial fibrillation   . Cerebrovascular disease, unspecified   . Unspecified venous (peripheral) insufficiency   . Other and unspecified hyperlipidemia   . Unspecified hypothyroidism   . Overweight   . Diverticulosis of colon (without mention of hemorrhage)   . Benign neoplasm of colon   . Osteoarthrosis, unspecified whether generalized or localized, unspecified site   . Disorder of bone and cartilage, unspecified   . Depressive disorder, not elsewhere classified     Past Surgical History  Procedure Date  . Tonsillectomy     Current Outpatient Prescriptions  Medication Sig Dispense Refill  . albuterol (PROAIR HFA) 108 (90 BASE) MCG/ACT inhaler Inhale 2 puffs into the lungs every 6 (six) hours as needed.        Marland Kitchen buPROPion (WELLBUTRIN XL) 150 MG 24 hr tablet Take 150 mg by mouth as directed.        . Cholecalciferol (VITAMIN D) 1000 UNITS capsule Take 1,000 Units by mouth daily.        . dabigatran (PRADAXA) 150 MG CAPS Take 150 mg by mouth every 12 (twelve) hours.        Marland Kitchen escitalopram (LEXAPRO) 20 MG tablet Take 20 mg by mouth as directed.        . fish oil-omega-3 fatty acids 1000 MG capsule Take 2 g by mouth daily.        . furosemide (LASIX) 20 MG tablet Take 20 mg by mouth daily.        Marland Kitchen L-Methylfolate (DEPLIN) 15 MG TABS Take by mouth daily.        Marland Kitchen  lithium (LITHOBID) 300 MG CR tablet Take 300 mg by mouth daily. 2 capsules daily.       . metoprolol succinate (TOPROL-XL) 25 MG 24 hr tablet Take 25 mg by mouth 2 (two) times daily.        Marland Kitchen OLANZapine (ZYPREXA) 2.5 MG tablet Take 2.5 mg by mouth as directed.        . polyethylene glycol (MIRALAX / GLYCOLAX) packet Take 17 g by mouth 2 (two) times daily.        . simvastatin (ZOCOR) 20 MG tablet Take 20 mg by mouth at bedtime.        Marland Kitchen SYNTHROID 125 MCG tablet Take 1 tablet (125 mcg total) by mouth daily.  30 each  11    Allergies not on file  Review of Systems negative except from HPI and PMH  Physical Exam Well developed and well nourished in no acute distress HENT normal E scleral and icterus clear Neck Supple JVPE.9-9; carotids brisk and full Bibasilar crackles. Irregular or rapid rate Soft with active bowel sounds No clubbing cyanosis and edema Alert and oriented, grossly normal motor and sensory function Skin Warm and Dry  ECG Atrial flutter-typical with upright flutter waves in V1 and inverted flutter waves inferiorly  Assessment  and  Plan

## 2011-01-11 NOTE — Telephone Encounter (Addendum)
Pt daughter states pt heart rate 122 and pt is gaining a little of fluid. Cell# [id number]

## 2011-01-16 ENCOUNTER — Ambulatory Visit (HOSPITAL_COMMUNITY)
Admission: RE | Admit: 2011-01-16 | Discharge: 2011-01-16 | Disposition: A | Discharge status: Home / Self Care | Payer: Medicare Other | Source: Ambulatory Visit | Attending: Cardiology | Admitting: Cardiology

## 2011-01-16 DIAGNOSIS — Z01810 Encounter for preprocedural cardiovascular examination: Secondary | ICD-10-CM | POA: Insufficient documentation

## 2011-01-16 DIAGNOSIS — Z538 Procedure and treatment not carried out for other reasons: Secondary | ICD-10-CM | POA: Insufficient documentation

## 2011-01-16 DIAGNOSIS — Z01812 Encounter for preprocedural laboratory examination: Secondary | ICD-10-CM | POA: Insufficient documentation

## 2011-01-16 LAB — COMPREHENSIVE METABOLIC PANEL
Albumin: 3.5 g/dL (ref 3.5–5.2)
Alkaline Phosphatase: 75 U/L (ref 39–117)
BUN: 19 mg/dL (ref 6–23)
Chloride: 99 mEq/L (ref 96–112)
Creatinine, Ser: 1.6 mg/dL — ABNORMAL HIGH (ref 0.4–1.2)
Glucose, Bld: 100 mg/dL — ABNORMAL HIGH (ref 70–99)
Total Bilirubin: 1 mg/dL (ref 0.3–1.2)
Total Protein: 5.9 g/dL — ABNORMAL LOW (ref 6.0–8.3)

## 2011-01-18 ENCOUNTER — Telehealth: Payer: Self-pay | Admitting: Internal Medicine

## 2011-01-19 MED ORDER — METOPROLOL SUCCINATE ER 25 MG PO TB24: 25.0000 mg | ORAL_TABLET | Freq: Two times a day (BID) | ORAL | Status: DC

## 2011-01-19 MED ORDER — FUROSEMIDE 20 MG PO TABS: 20.0000 mg | ORAL_TABLET | Freq: Every day | ORAL | Status: DC

## 2011-01-19 NOTE — Telephone Encounter (Signed)
Prescription sent as was in last OV note

## 2011-01-22 ENCOUNTER — Other Ambulatory Visit (INDEPENDENT_AMBULATORY_CARE_PROVIDER_SITE_OTHER): Payer: Medicare Other | Admitting: *Deleted

## 2011-01-22 ENCOUNTER — Other Ambulatory Visit: Payer: Self-pay | Admitting: *Deleted

## 2011-01-22 DIAGNOSIS — I5033 Acute on chronic diastolic (congestive) heart failure: Secondary | ICD-10-CM

## 2011-01-22 DIAGNOSIS — R0602 Shortness of breath: Secondary | ICD-10-CM

## 2011-01-22 LAB — BASIC METABOLIC PANEL
Calcium: 9.6 mg/dL (ref 8.4–10.5)
GFR: 34.63 mL/min — ABNORMAL LOW (ref >60.00)
Glucose, Bld: 102 mg/dL — ABNORMAL HIGH (ref 70–99)
Potassium: 4.5 mEq/L (ref 3.5–5.1)
Sodium: 141 mEq/L (ref 135–145)

## 2011-01-22 LAB — BRAIN NATRIURETIC PEPTIDE: Pro B Natriuretic peptide (BNP): 279 pg/mL — ABNORMAL HIGH (ref 0.0–100.0)

## 2011-01-22 MED ORDER — POTASSIUM CHLORIDE CRYS ER 20 MEQ PO TBCR: 20.0000 meq | EXTENDED_RELEASE_TABLET | Freq: Two times a day (BID) | ORAL | Status: DC

## 2011-01-28 ENCOUNTER — Encounter: Payer: Self-pay | Admitting: Internal Medicine

## 2011-01-28 ENCOUNTER — Ambulatory Visit (INDEPENDENT_AMBULATORY_CARE_PROVIDER_SITE_OTHER): Payer: Medicare Other | Admitting: Internal Medicine

## 2011-01-28 VITALS — BP 110/72 | HR 93 | Ht 68.0 in | Wt 204.0 lb

## 2011-01-28 DIAGNOSIS — I4891 Unspecified atrial fibrillation: Secondary | ICD-10-CM

## 2011-01-28 DIAGNOSIS — N179 Acute kidney failure, unspecified: Secondary | ICD-10-CM

## 2011-01-28 DIAGNOSIS — I5033 Acute on chronic diastolic (congestive) heart failure: Secondary | ICD-10-CM

## 2011-01-28 DIAGNOSIS — I4892 Unspecified atrial flutter: Secondary | ICD-10-CM

## 2011-01-28 MED ORDER — METOPROLOL SUCCINATE ER 25 MG PO TB24: 50.0000 mg | ORAL_TABLET | Freq: Two times a day (BID) | ORAL | Status: DC

## 2011-01-28 NOTE — Patient Instructions (Addendum)
Your physician recommends that you schedule a follow-up appointment in: 3 months with Dr Rayann Heman  Your physician has recommended you make the following change in your medication: increase Metoprolol to 50mg  one tablet twice daily

## 2011-01-28 NOTE — Assessment & Plan Note (Signed)
Improving with rate control and salt restriction  No changes today.

## 2011-01-28 NOTE — Progress Notes (Signed)
The patient presents today for routine electrophysiology followup.  Since last being seen in our clinic, the patient reports doing reasonably well.  She was evaluated by Dr Caryl Comes several weeks ago for elevated heart rate and found to have atrial flutter with RVR.  She presented to [facility] for cardioversion but had returned to sinus rhythm at that time.  Today, she is again in atrial flutter.  Interestingly, she appears to have no correlated symptoms and cannot tell when she is in afib.  She denies symptoms of palpitations, chest pain, orthopnea, PND, dizziness, presyncope, syncope, or neurologic sequela.  Her SOB is stable and her edema has improved with salt restriction.  The patient feels that she is tolerating medications without difficulties and is otherwise without complaint today.   Past Medical History  Diagnosis Date  . Atrial fibrillation   . Atrial flutter   . Diastolic dysfunction   . Other and unspecified hyperlipidemia   . Unspecified hypothyroidism   . Overweight   . Diverticulosis of colon (without mention of hemorrhage)   . Benign neoplasm of colon   . Osteoarthrosis, unspecified whether generalized or localized, unspecified site   . Disorder of bone and cartilage, unspecified   . Depressive disorder, not elsewhere classified    Past Surgical History  Procedure Date  . Tonsillectomy     Current Outpatient Prescriptions  Medication Sig Dispense Refill  . albuterol (PROAIR HFA) 108 (90 BASE) MCG/ACT inhaler Inhale 2 puffs into the lungs every 6 (six) hours as needed.        Marland Kitchen buPROPion (WELLBUTRIN XL) 300 MG 24 hr tablet Take 300 mg by mouth daily.        . dabigatran (PRADAXA) 150 MG CAPS Take 150 mg by mouth every 12 (twelve) hours.        Marland Kitchen escitalopram (LEXAPRO) 20 MG tablet Take 20 mg by mouth as directed.        . furosemide (LASIX) 20 MG tablet Take by mouth. 2 tabs in the morning and 1 tab in the evening.       Marland Kitchen levothyroxine (SYNTHROID, LEVOTHROID) 112 MCG  tablet Take 112 mcg by mouth daily.        Marland Kitchen lithium (LITHOBID) 300 MG CR tablet Take 300 mg by mouth daily. 2 capsules daily.       . metoprolol succinate (TOPROL-XL) 25 MG 24 hr tablet Take 1 tablet (25 mg total) by mouth 2 (two) times daily.  180 tablet  2  . OLANZapine (ZYPREXA) 2.5 MG tablet Take 2.5 mg by mouth as directed.        . polyethylene glycol (MIRALAX / GLYCOLAX) packet Take 17 g by mouth 2 (two) times daily.        . potassium chloride SA (K-DUR,KLOR-CON) 20 MEQ tablet Take 1 tablet (20 mEq total) by mouth 2 (two) times daily.  180 tablet  3  . simvastatin (ZOCOR) 20 MG tablet Take 20 mg by mouth at bedtime.        Marland Kitchen DISCONTD: furosemide (LASIX) 20 MG tablet Take 1 tablet (20 mg total) by mouth daily.  90 tablet  2  . DISCONTD: Cholecalciferol (VITAMIN D) 1000 UNITS capsule Take 1,000 Units by mouth daily.        Marland Kitchen DISCONTD: SYNTHROID 125 MCG tablet Take 1 tablet (125 mcg total) by mouth daily.  30 each  11    No Known Allergies  History   Social History  . Marital Status: Married  Spouse Name: N/A    Number of Children: N/A  . Years of Education: N/A   Occupational History  . Not on file.   Social History Main Topics  . Smoking status: Never Smoker   . Smokeless tobacco: Not on file  . Alcohol Use: No  . Drug Use: No  . Sexually Active: Not on file   Other Topics Concern  . Not on file   Social History Narrative  . No narrative on file    Family History  Problem Relation Age of Onset  . Heart failure Mother   . Arthritis Mother   . Heart disease Father   . Heart disease Other     ROS-  All systems are reviewed and are negative except as outlined in the HPI above  Physical Exam: Filed Vitals:   01/28/11 1356  BP: 110/72  Pulse: 93  Height: 5\' 8"  (1.727 m)  Weight: 204 lb (92.534 kg)    GEN- The patient is elderly and overweight, alert and oriented x 3 today.   Head- normocephalic, atraumatic Eyes-  Sclera clear, conjunctiva pink Ears-  hearing intact Oropharynx- clear Neck- supple, no JVP Lymph- no cervical lymphadenopathy Lungs- Clear to ausculation bilaterally, normal work of breathing Heart- irregular rate and rhythm, no murmurs, rubs or gallops, PMI not laterally displaced GI- soft, NT, ND, + BS Extremities- no clubbing, cyanosis, 1+ edema MS- no significant deformity or atrophy Skin- no rash or lesion Psych- euthymic mood, full affect Neuro- strength and sensation are intact  EKG today reveals typical appearing atrial flutter with V rate 90s.  Assessment and Plan:

## 2011-01-28 NOTE — Assessment & Plan Note (Signed)
Creatinine clearance 01/22/11 was > 30.  We will therefore continue pradaxa 150mg  BID (her current regimen at this time).  We will consider decreasing pradaxa to 75mg  BID if further worsening of renal function occurs.

## 2011-01-28 NOTE — Assessment & Plan Note (Signed)
Presently better rate controlled. Continue pradaxa for stroke prevention. Increase Toprol to 50mg  BID for rate control. I had a long discussion with the patient today.  I have offered catheter ablation for atrial flutter for rate control, however she is very clear that she is not interested in ablation at this time.  Given her advance age and relative paucity of symptoms with rate control, I think that this is a reasonable strategy.  As she also has afib, she will require life long anticoagulation anyway.

## 2011-02-01 ENCOUNTER — Ambulatory Visit: Payer: Self-pay | Admitting: Pulmonary Disease

## 2011-02-08 ENCOUNTER — Ambulatory Visit (INDEPENDENT_AMBULATORY_CARE_PROVIDER_SITE_OTHER): Payer: Medicare Other | Admitting: Pulmonary Disease

## 2011-02-08 ENCOUNTER — Encounter: Payer: Self-pay | Admitting: Pulmonary Disease

## 2011-02-08 DIAGNOSIS — M949 Disorder of cartilage, unspecified: Secondary | ICD-10-CM

## 2011-02-08 DIAGNOSIS — E785 Hyperlipidemia, unspecified: Secondary | ICD-10-CM

## 2011-02-08 DIAGNOSIS — I4891 Unspecified atrial fibrillation: Secondary | ICD-10-CM

## 2011-02-08 DIAGNOSIS — K5730 Diverticulosis of large intestine without perforation or abscess without bleeding: Secondary | ICD-10-CM

## 2011-02-08 DIAGNOSIS — E039 Hypothyroidism, unspecified: Secondary | ICD-10-CM

## 2011-02-08 DIAGNOSIS — I679 Cerebrovascular disease, unspecified: Secondary | ICD-10-CM

## 2011-02-08 DIAGNOSIS — I872 Venous insufficiency (chronic) (peripheral): Secondary | ICD-10-CM

## 2011-02-08 DIAGNOSIS — M1990 Unspecified osteoarthritis, unspecified site: Secondary | ICD-10-CM

## 2011-02-08 DIAGNOSIS — F329 Major depressive disorder, single episode, unspecified: Secondary | ICD-10-CM

## 2011-02-08 DIAGNOSIS — E663 Overweight: Secondary | ICD-10-CM

## 2011-02-08 DIAGNOSIS — I5033 Acute on chronic diastolic (congestive) heart failure: Secondary | ICD-10-CM

## 2011-02-08 NOTE — Patient Instructions (Signed)
Today we updated your med list in EPIC...    Continue your current meds the same...  We reviewed your recent Hospitalizations, XRays, & lab work...  Call for any problems...  Let's plan a follow up visit in 6 months.Marland KitchenMarland Kitchen

## 2011-02-09 ENCOUNTER — Encounter: Payer: Self-pay | Admitting: Pulmonary Disease

## 2011-02-09 NOTE — Progress Notes (Signed)
Subjective:    Patient ID: Sonya Kaufman, female    DOB: 01-Dec-1930, 77 y.o.   MRN: [id number]  HPI 77 y/o WF here for a follow up visit... she has mult med problems as noted below... she is followed by DrSteiner for Psychiatry w/ severe depression on 5 diff meds as below...  ~  Mar11:  35mo f/u visit- she's had some worsening depression & meds adjusted per DrSteiner w/ Lithium incr to Bid & Deplin 15mg /d added... she would like full thyroid function tests to be sure that the current dose of Synthroid (184mcg/d) is adeq replacement Rx for her... otherw feeling satis & stable on current meds.  **Note: TFT's WNL but low-end & we decided to incr Synthroid to 152mcg/d...  ~  August 03, 2010:  routine ROV feeling OK & w/o any new complaints or concerns... exam shows irreg irreg heart w/ AFib on EKG, rate 106 (80-120 range)... daughter indicates strong FamHx AFib in several relatives but [name] hasn't had this before... Thyroid dose was incr to 167mcg/d last OV & we will recheck labs, check CXR, & refer to Cards for 2DEcho & Rx... for now start low dose BBlocker Rx.  ~  Feb 08, 2011:  70mo ROV & post hosp visit> she was hosp 2/12 by [facility] w/ RLL Pneum & CHF, then had to be readmit 3/12 by Cards w/ acute on chronic diastolic CHF w/ recurrent PAF;  She had f/u DrAllred  3/12 w/ meds adjusted- SEE BELOW...  Thyroid medication had to be adjusted & currently on Synthroid 148mcg/d & doing satis...        Problem List:  ASTHMATIC BRONCHITIS, ACUTE (ICD-466.0) - on PROAIR as needed; she is a non-smoker> denies cough, sputum, hemoptysis, worsening dyspnea,  wheezing, chest pains, snoring, daytime hypersomnolence, etc...  ~  CXR 11/11 showed cardiomeg, clear lungs, NAD.Marland Kitchen. ~  CXR 3/12 showed cardiomeg, vasc congestion, mild interst edema> c/w mild CHF...  ACUTE ON CHRONIC DIASTOLIC HEART FAILURE> on METOPROLOL 25mg - 2Bid, LASIX 20mg - 2AM & 1PM, KCl 20mEq Bid,and PRADAXA 150mg  bid... ATRIAL FIBRILLATION  (ICD-427.31) - new onset noted 11/11 OV & pt asymptomatic w/o CP, palpit, dizzy, SOB, etc> referred to DrAllred. ~  She has had freq med adjustments in the interval by Cards/ DrAllred> on & off Cardizem, up & down on Pradaxa Loss adjuster, chartered & Epic reviewed). ~  5/12:  They report current meds: METOPROLOL 25mg - 2Bid, LASIX 20mg - 2AM & 1PM, KCl 56mEq Bid,and PRADAXA 150mg  bid.  CEREBROVASCULAR DISEASE (ICD-437.9) - on PRADAXA 150mg  Bid...  ~  prev eval by DrLove in 2003... CT showed bilat sm vessel ischemic disease... prev on ASA 81mg /d.  VENOUS INSUFFICIENCY (ICD-459.81) - she follows a low sodium diet and uses support hose as needed...  HYPERLIPIDEMIA (ICD-272.4) - on SIMVASTATIN 20mg /d + FishOil 1000mg /d... ~  [place] 11/07 showed TChol 140, Tg 90, HDL 52, LDL 70... ~  [place] 5/09 showed TChol 181, TG 105, HDL 51, LDL 110... rec- same med, better diet for now. ~  FL[place] 9/10 showed TChol 174, TG 129, HDL 63, LDL 86 ~  She needs to return FASTING for FLP recheck...  HYPOTHYROIDISM (ICD-244.9) - currently on SYNTHROID 18mcg/d... ~  prev TSH 7/08 was 3.56 ~  labs 5/09 showed TSH= 2.06 ~  labs 9/10 showed TSH= 2.30 ~  labs 3/11 showed TSH= 3.13, FreeT3= 2.4 (2.3-4.2), FreeT4= 0.9 (0.6-1.6)... rec incr to 159mcg/d. ~  labs 11/11 on Levoth125 showed TSH= 0.92... keep same for now. ~  During  the 2/12 hosp she was cut back to 115mcg/d dose & she says she feels better on this dose. ~  Labs 3/12 showed TSH= 5.80 & she declined to incr the Synthroid back to 134mcg/d...  OVERWEIGHT (ICD-278.02) - diet + exercise program discussed... ~  weight 5/09 = 206# ~  weight 9/10 = 206# ~  weight 3/11 = 213# ~  weight 11/11 = 209# ~  Weight 5/12 = 206#  DIVERTICULOSIS OF COLON (ICD-562.10) COLONIC POLYPS (ICD-211.3) - she had routine colonoscopy 10/10 by DrStark showing several 4-82mm polyps & mild divertics- path showed one tubular adenoma & f/u planned 66yrs.  OSTEOARTHRITIS (ICD-715.90) - hx knee pain in past, now  c/o intermiitent right hip pain... she never filled Mobic Rx & uses OTC pain meds Prn.  OSTEOPENIA (ICD-733.90) - I can't find prev BMD in the system... takes Calcium, MVI, Vit D... ~  labs 5/09 showed Vit D level = 31... rec- 1000 u Vit D OTC supplement.  DEPRESSION (ICD-311) - This has been her major problem over the years... several psyche hospitalizations and one OD admit... followed by DrSteiner every 2-3 months, on several meds & adjusted frequently... Currently on WELLBUTRIN XL 150,  LEXAPRO 20,  ZYPREXA 2.5, &  LITHOBID incr to 2 tabs daily (prev Deplin was stopped). ~  9/10:  she reports doing very well on her regimen- stable w/o acute problems. ~  3/11:  reports incr depressive symptoms- meds adjusted & Deplin added by DrSteiner. ~  11/11: reports stable on her 5 med regimen... she & husb continue to care for their 21 y/o son w/ CP. ~  5/12: she is currently on 4 meds from DrSteiner, husb ill, kids having to care for them + handicaped brother...  Past Surgical History  Procedure Date  . Tonsillectomy     Outpatient Encounter Prescriptions as of 02/08/2011  Medication Sig Dispense Refill  . albuterol (PROAIR HFA) 108 (90 BASE) MCG/ACT inhaler Inhale 2 puffs into the lungs every 6 (six) hours as needed.        Marland Kitchen buPROPion (WELLBUTRIN XL) 300 MG 24 hr tablet Take 300 mg by mouth daily.        . dabigatran (PRADAXA) 150 MG CAPS Take 150 mg by mouth every 12 (twelve) hours.        Marland Kitchen escitalopram (LEXAPRO) 20 MG tablet Take 20 mg by mouth as directed.        . furosemide (LASIX) 20 MG tablet Take by mouth. 2 tabs in the morning and 1 tab in the evening.       Marland Kitchen levothyroxine (SYNTHROID, LEVOTHROID) 112 MCG tablet Take 112 mcg by mouth daily.        Marland Kitchen lithium (LITHOBID) 300 MG CR tablet Take 300 mg by mouth daily. 2 capsules daily.       . metoprolol succinate (TOPROL-XL) 25 MG 24 hr tablet Take 2 tablets (50 mg total) by mouth 2 (two) times daily.  60 tablet  11  . OLANZapine (ZYPREXA)  2.5 MG tablet Take 1 1/2 tablet by mouth once daily      . polyethylene glycol (MIRALAX / GLYCOLAX) packet Take 17 g by mouth 2 (two) times daily.        . potassium chloride SA (K-DUR,KLOR-CON) 20 MEQ tablet Take 1 tablet (20 mEq total) by mouth 2 (two) times daily.  180 tablet  3  . simvastatin (ZOCOR) 20 MG tablet Take 20 mg by mouth at bedtime.        Marland Kitchen  DISCONTD: Cholecalciferol (VITAMIN D) 1000 UNITS capsule Take 1,000 Units by mouth daily.          No Known Allergies      Review of Systems         See HPI - all other systems neg except as noted... The patient complains of SOB/ DOE, & depression.  The patient denies anorexia, fever, weight loss, weight gain, vision loss, decreased hearing, hoarseness, chest pain, syncope, peripheral edema, prolonged cough, headaches, hemoptysis, abdominal pain, melena, hematochezia, severe indigestion/heartburn, hematuria, incontinence, muscle weakness, suspicious skin lesions, transient blindness, difficulty walking, unusual weight change, abnormal bleeding, enlarged lymph nodes, and angioedema.     Objective:   Physical Exam     WD, Overweight, 77 y/o WF in NAD... she has a flat affect... Vital Signs:  Reviewed... GENERAL:  Alert & oriented; pleasant & cooperative... HEENT:  [place]/AT, EOM-wnl, PERRLA, EACs-clear, TMs-wnl, NOSE-clear, THROAT-clear & wnl. NECK:  Supple w/ fairROM; no JVD; normal carotid impulses w/o bruits; no thyromegaly or nodules palpated; no lymphadenopathy. CHEST:  Clear to P & A; without wheezes/ rales/ or rhonchi heard... HEART:  Irreg AFib w/ variable VR; without murmurs/ rubs/ or gallops detected... ABDOMEN:  Obese, soft & nontender; normal bowel sounds; no organomegaly or masses palpated... EXT: without deformities, mild arthritic changes; no varicose veins/ +venous insuffic/ no edema. NEURO:  CN's intact;  no focal neuro deficits... DERM:  No lesions noted; no rash etc...   Assessment & Plan:   CARDIAC>  Followed by  DrAllred- Diastolic CHF, AFib, etc>  Stable on Pradaxa, Toprol, Lasix... Continue same...  LIPIDS>  She is tol Simva20 + FishOil very well> needs to ret FASTING for f/u FLP...  HYPOTHY>  On Syntroid 177mcg/d & she does not want to incr the dose for TSH in March= 5.80;  rec f/u fasting labs soon...  Overweight>  We discussed diet + exercise program...  GI>  Stable...  ORTHO>  DJD, Osteopenia> she uses OTC analgesics prn but needs Calcium, MVI, Vit D supplementation...  DEPRESSION>  A major prob for her, she sees DrSteiner for meds, continue regular f/u w/ Psychiatry.Marland KitchenMarland Kitchen

## 2011-02-12 NOTE — Assessment & Plan Note (Signed)
Sonya Kaufman                             PULMONARY OFFICE NOTE   IVIANA, BAUWENS                        MRN:          [id number]  DATE:06/12/2007                            DOB:          12/04/1930    HISTORY OF PRESENT ILLNESS:  The patient is a 77 year old white female  patient of Dr. Jeannine Kitten who has a known history of hyperlipidemia,  osteoarthritis, and depression, presents today complaining of ankle  edema.  The patient complains over the last several weeks that she has  noticed she has had ankle edema especially in the afternoon hours, after  being up on her feet.  The patient has had some mild shortness of breath  with activity.  She denies any chest pain, palpitations, orthopnea, PND,  calf pain.  The patient has been having difficulty with depression over  the last 6 months.  Has been seen by a psychiatrist.  She is currently  on lithium, Zyprexa, and Lexapro.  The patient reports she has had about  a 25-30 pound weight gain over the last 6 months as well.   PAST MEDICAL HISTORY:  Reviewed.   CURRENT MEDICATIONS:  Reviewed.   PHYSICAL EXAM:  The patient is a somewhat depressed elderly female in no  acute distress.  Temperature is 99.0, blood pressure 142/84, O2 saturation 94% on room  air.  HEENT:  Unremarkable.  NECK:  Supple without cervical adenopathy.  No JVD.  LUNGS:  Sounds are clear without any wheezing or crackles.  CARDIAC:  S1, S2 without murmur, rub, or gallop.  ABDOMEN:  Soft and nontender with no palpable hepatosplenomegaly.  No  guarding or rebound.  EXTREMITIES:  Warm without any calf cyanosis or clubbing.  There is  trace edema.   IMPRESSION AND PLAN:  1. Lower extremity edema with suspected venous insufficiency due to      recent weight gain.  The patient is encouraged on weight loss and a      low sodium diet.  The patient is to use Lasix 20 mg 1/2 to whole      daily as needed for edema.  The patient will return  back in 1 month      with Dr. Lenna Gilford or sooner if needed.  2. Depression.  Continue to follow up with her psychiatrist as      recommended.  3. Morbid obesity.  The patient is encouraged on dietary and exercise      measures.  Patient education was given.      Rexene Edison, NP  Electronically Signed      Deborra Medina. Lenna Gilford, MD  Electronically Signed   TP/MedQ  DD: 06/12/2007  DT: 06/13/2007  Job #: [id number]

## 2011-02-15 ENCOUNTER — Telehealth: Payer: Self-pay | Admitting: Internal Medicine

## 2011-02-15 NOTE — H&P (Signed)
[place]  Patient:    Sonya Kaufman, Sonya Kaufman Visit Number: [id number] MRN: [id number]          Service Type: PSY Location: [place] Attending Physician:  Rulon Eisenmenger Dictated by:   Kerrie Buffalo Scott, N.P. Admit Date:  01/13/2002                     Psychiatric Admission Assessment  DATE OF ASSESSMENT:  January 14, 2002 at 2:25 p.m.  IDENTIFYING INFORMATION:  This is a 77 year old Caucasian female who is married.  She is a voluntary admission.  HISTORY OF PRESENT ILLNESS:  This patient presented in the emergency room after an overdose of an unknown amount of Ambien.  She was admitted on January 12, 2002 to the medicine service, medically cleared and subsequently transferred to [facility] on January 13, 2002.  The patient has a 10-year history of depression and she was stable actually on no medications until she fell on October 17, 2001 and fractured her right patella.  Since that time, she has experienced anhedonia, dysphoria, decreased energy, has been feeling helpless and quite forgetful at times.  She denies any specific suicidal ideation and states, in relation to taking the Ambien, "I just wanted to go to sleep.  Im not sure if I wanted to hurt myself or not."  Her husband normally controls her medications but apparently she had gotten a hold of the Ambien and had swallowed most of it before he was able to catch her.  Today, she denies any suicidal ideation.  She denies any other dangerous ideations. Denies any hallucinations.  Some of her history is taken from the record.  She is cooperative today but has significant thought-blocking and is having some difficulty with recall.  According to the history in the record, she has no history of mania or compulsions or obsessions and has no prior history of suicide attempts.  PAST PSYCHIATRIC HISTORY:  The patient is followed by Dr. Margart Sickles.  This is her first admission to [facility].  According to information in the record, the patient had onset of depression approximately 10 years ago with two hospitalizations around that time.  She was eventually well-managed on both Wellbutrin and Luvox and then eventually on no medications at all.  She experienced an exacerbation of her symptoms starting in January, when she fell on the ice and suffered a fractured right patella and decreased activities of daily living as a result of the fracture.  SOCIAL HISTORY:  The patient has been married for 53 years.  She says she has a good marriage and a supportive husband.  She has been employed as a Agricultural engineer.  She has four children, aged 7, 46, 19 and 70.  Says that she has good relationships with her children.  She is active in the [employer] here in town.  FAMILY HISTORY:  The patient denies.  ALCOHOL/DRUG HISTORY:  The patient denies any substance abuse or alcohol abuse.  She uses no tobacco.  MEDICAL HISTORY:  The patient is followed by Dr. Teressa Lower, who is her primary care physician.  Medical problems include apparent past history of asthma, hypothyroidism, chronic low back pain, insomnia and she is status post right patella fracture.  Past medical history is also remarkable for a tonsillectomy and adenoidectomy many years ago.  MEDICATIONS:  Apparently up until admission, Synthroid 100 mcg daily, Klonopin 2 mg at h.s. and 0.25 mg b.i.d., Luvox 50 mg  p.o. for the past six weeks. Wellbutrin was added approximately two weeks ago 100 mg SR q.a.m. and Zyprexa 2.5 mg b.i.d.  DRUG ALLERGIES:  None.  POSITIVE PHYSICAL FINDINGS:  The patients physical examination was done on the medicine unit.  It is noted that she did have a portable chest film that showed some unusual densities on the right side over her heart and radiology had recommended an additional PA and lateral of her chest.  Her EKG showed a QTc of 403.  Vital signs, on admission to the  unit, were temperature 97.5, pulse 90, respirations 16, blood pressure 170/81.  She is 5 feet 8 inches tall and we do not have a weight on her at this time.  LABORATORY DATA:  CBC showed RBCs of 5.45, hemoglobin 16.5, hematocrit 47.2, RDW 14.6.  PT, PTT and INR within normal limits.  Her electrolytes are normal. BUN 21, creatinine 1.3, SGOT 21, SGPT 24.  The patients TSH is 4.081. Acetaminophen level was less than 10.  Salicylate level was less than 4. Urine drug screen was clear.  Alcohol level was less than 5.  Her UA was essentially normal with a specific gravity of 1.008.  MENTAL STATUS EXAMINATION:  This is a healthy-appearing, older female, who is large-built.  She has quite a flattened affect and significant psychomotor slowing.  She is cooperative and polite with good eye contact.  Her speech is soft and mumbled.  She takes a considerable amount of time searching for her words.  Mood is depressed.  Her thought process is remarkable for significant thought-blocking and difficulty selecting her words, although her memory actually seems to be intact.  She takes quite a bit of time with recall and has struggles quite a bit with finding facts.  She does have some difficulty tracking also because of this.  No evidence of dangerous ideation.  No suicidal ideation or homicidal ideation.  No evidence of overt hallucinations. Cognitively, she is intact.  DIAGNOSES: Axis I:    Major depression, recurrent, severe. Axis II:   Deferred. Axis III:  1. Hypothyroid.            2. Status post right patella fracture.            3. Asthma, by history. Axis IV:   Deferred. Axis V:    Current 29; past year 30.  PLAN:  Voluntarily admit the patient to evaluate her depression, to alleviate her neurovegetative symptoms and evaluate this episode and determine what, if any, suicidal ideation she has and hopefully lift her depression.  Our goal is to decrease her thought-blocking, lift her depression  and ensure she has no suicidal ideation.  We are going to ask the casemanager to get in touch with  her husband to validate her history and we will get the most recent notes we can from Dr. Marella Bile.  We have elected to decrease her Wellbutrin at this time and manage her on the Luvox alone and will consider increasing that.  Begin to titrate that upwards.  Meanwhile, we will get a follow-up PA and lateral of her chest per radiologys recommendations and go ahead and start her on some albuterol p.r.n.  ESTIMATED LENGTH OF STAY:   Seven to nine days. Dictated by:   Kerrie [place] Scott, N.P. Attending Physician:  Rulon Eisenmenger DD:  01/14/02 TD:  01/17/02 Job: [id number]

## 2011-02-15 NOTE — Telephone Encounter (Signed)
I called pharmacy back regarding this Rx question or new dose?  Pharmacy tech could not find reason for the call to our office and the Rx had already been taken care of. Will follow up if there are future problems.  Doug Sou, CMA

## 2011-02-15 NOTE — Discharge Summary (Signed)
NAMEBAYLOR, RANUM               ACCOUNT NO.:  [id number]   MEDICAL RECORD NO.:  [id number]          PATIENT TYPE:  IPS   LOCATION:  0301                          FACILITY:  [facility]   PHYSICIAN:  Norm Salt, MD  DATE OF BIRTH:  08-23-1931   DATE OF ADMISSION:  01/22/2007  DATE OF DISCHARGE:  01/24/2007                               DISCHARGE SUMMARY   IDENTIFYING DATA AND REASON FOR ADMISSION:  This was an inpatient  psychiatric admission for Sonya Kaufman, a 77 year old married woman and patient  of Dr. Albertine Patricia, who presented with increasing depression, associated  with markedly decreased functioning and self-care at home.  She had not  had suicidal ideation or suicide risk of prior to admission.  The  patient had requested admission, given that Dr. Albertine Patricia, her outpatient  psychiatrist, who was not able to intervene in such a way as to make  hospitalization not necessary.  She came to [place] on a regimen of Lexapro,  Wellbutrin, lithium, Zyprexa, and Klonopin.   The patient described that her depression had gotten to the point where  I can't clean or cook, or look after my personal needs, like bathing.  She also had severe insomnia.   There were some specific stressors and precipitants for her worsening  episode of depression.  The patient and her husband have a 77 year old  son was severe cerebral palsy who requires total care, which they  provide themselves.  Apparently, the patient's son had required  placement of a permanent feeding tube just days before her  hospitalization here.  In addition, the patient had had a viral illness  a few weeks prior, and then her husband became ill with a viral illness.  Please refer to the admission note for further details pertaining to the  symptoms, circumstances and history that led to her hospitalization.  She was given initial Axis I diagnoses of major depressive disorder,  recurrent, severe without psychotic features, possible post viral  exacerbation of depression, and multiple stressors.   MEDICAL AND LABORATORY:  The patient was medically and physically  assessed by the psychiatric nurse practitioner.  She appeared to have a  urinary tract infection and this was treated with Cipro 250 mg b.i.d.  Nonpsychotropic medications included Synthroid 112 mcg daily.  The  patient was given Ensure to assist with nutritional status.  There were  no acute medical issues.   HOSPITAL COURSE:  The patient was admitted to the adult inpatient  psychiatric service.  She presented as a normally developed, slender  woman who was alert, and fully oriented, but appeared profoundly  depressed, with flat tired affect.  Her speech was soft and slowed, and  there was an apathetic quality about her.  There were no signs or  symptoms of psychosis or thought disorder.  She denied suicidal thoughts  or plans or impulses.  She freely discussed the stressors that had  contributed to her depression.  She stated that she did feel like she  needed to be the hospital.  We discussed the possible alternative of  intensive outpatient treatment at the beginning of  her stay, but she  maintained that she had felt so miserable and nonfunctional at home that  she felt safer more comfortable in the inpatient setting.   The patient was continued on her usual psychotropic regimen, including  Lexapro, Wellbutrin, lithium, Zyprexa, and Klonopin.   We learned fairly early on in her stay that her insurance carrier would  not support inpatient treatment, this being their decision after a  doctor-to-doctor discussion between the undersigned and their physician  reviewer.   We formulated a plan with the patient to begin the intensive outpatient  program at [facility] the following Monday.  The patient was open to  attending the program, but expressed concern about whether  transportation to the program would be possible.  At the time of  discharge and this dictation,  it was not clear whether the  transportation issue could be adequately solved in time for the patient  to begin the program on January 26, 2007.   AFTERCARE:  As above.   DISCHARGE MEDICATIONS:  The patient was instructed to continue her usual  medications as follows:  1. Lexapro 20 mg daily.  2. Wellbutrin XL 150 mg daily.  3. Lithium carbonate extended release 600 b.i.d.  4. Synthroid 112 mcg daily.  5. Klonopin 0.25 mg t.i.d.  6. Zyprexa 5 mg nightly.  7. The patient was also instructed to continue Cipro 250 mg b.i.d. for      7 days more.   DISCHARGE DIAGNOSES:  AXIS I:  Major depressive disorder, recurrent,  without psychotic features.  AXIS II:  Deferred.  AXIS III:  History of urinary tract infection, hypothyroidism.  AXIS IV:  Stressors severe.  AXIS V:  Global assessment of functioning on discharge 50.      Norm Salt, MD  Electronically Signed     SPB/MEDQ  D:  01/26/2007  T:  01/26/2007  Job:  [id number]

## 2011-02-15 NOTE — Discharge Summary (Signed)
[place]  Patient:    Sonya Kaufman, BRANDWEIN Visit Number: [id number] MRN: [id number]          Service Type: PSY Location: [place] Attending Physician:  Rulon Eisenmenger Dictated by:   Rulon Eisenmenger, M.D. Admit Date:  01/13/2002 Discharge Date: 01/25/2002                             Discharge Summary  IDENTIFYING DATA:  This is a 77 year old Caucasian female, married, voluntarily admitted, presenting to the emergency room after an overdose of Ambien.  The patient feel on January 18 and fractured her right patella; since that time, has had anhedonia, dysphoria, decreased energy, felt hopeless and forgetful at times; however she denied suicidal ideation, reporting that she took Ambien to help her sleep.  The patient was followed up by Dr. [name].  ADMISSION MEDICATIONS: 1. Synthroid 100 mcg daily. 2. Klonopin 2 mg q.h.s. and 0.25 mg b.i.d. 3. Luvox 50 mg which she has been on for six weeks. 4. Wellbutrin added two weeks ago 100 mg SR q.a.m. 5. Zyprexa 2.5 mg b.i.d.  ALLERGIES:  No known drug allergies.  PHYSICAL EXAMINATION:  GENERAL:  Essentially within normal limits.  There were some unusual densities on the portable chest x-ray that was done and followup was ordered.  EKG showed QTc 403.  VITAL SIGNS:  Within normal limits.  NEUROLOGIC:  Nonfocal.  ROUTINE ADMISSION LABORATORY DATA:  Hemoglobin 15.5, hematocrit 47.5.  PT, PTT, and INR were within normal limits.  Electrolytes: Within normal limits. TSH 4.081.  Acetaminophen level: Less than 10.  Urine drug screen: Clear. Alcohol level: Less than 5.  Urinalysis: Negative.  MENTAL STATUS EXAMINATION:  Healthy appearing, older female, large built, flat affect, significant psychomotor slowing, cooperative, polite with good eye contact.  Speech: Soft and mumbled, had difficulty finding words.  Mood: Depressed.  Affect: Restricted.  Thought process: Positive for possible thought  blocking and difficulty selecting words, although memory seemed to be intact, struggling to find facts.  She denied any suicidal or homicidal ideations or overt psychotic symptoms including hallucinations.  ADMITTING DIAGNOSES: Axis I:    Major depression, recurrent, severe. Axis II:   None. Axis III:  1. Hypothyroidism.            2. Status post right patellar fracture.            3. Asthma by history. Axis IV:   Moderate; support system and medical problems. Axis V:    [place] COURSE:  The patient was admitted and ordered routine p.r.n. medications and was restarted on psychotropics, Luvox, Zyprexa, Wellbutrin, in addition to Synthroid.  She was placed on fall precautions.  Luvox was optimized as well as Zyprexa, targeting residual depressive symptoms and Wellbutrin was also optimized.  The patient did have a fall and hip was x-rayed.  Luvox and Zyprexa were discontinued and Lexapro was started. Physical therapy was ordered and the patient was advised to use a wheelchair. Zyprexa dose was decreased to 7.5 mg q.h.s. and Lexapro titrated.  Zyprexa was then increased as tolerated and the patient was on one-to-one observations to avoid risk of falls as she was stabilized from medications.  The patient gradually improved, responding to clinical intervention.  CONDITION AT DISCHARGE:  Improved.  Mood: More euthymic.  Affect: Slightly brighter.  Thought process: More goal directed and appropriate to content. Thought content: Negative for dangerous ideation or  psychotic symptoms.  The patient reported motivation and some hope, planning to be compliant with the followup plan.  DISCHARGE MEDICATIONS: 1. Levothyroxine 100 mcg q.a.m. 2. Zyprexa 5 mg q.h.s. 3. Lexapro 10 mg q.a.m. 4. Wellbutrin SR 100 mg q.a.m. and 3 p.m. 5. Ambien 10 mg one and a half q.h.s. p.r.n. insomnia.  FOLLOWUP: 1. The patient was to have a neurology followup consultation with Dr. Erling Cruz May 16 at 2  p.m. 2. Dr. [name] on May 1 at 11 a.m.  DISCHARGE DIAGNOSES: Axis I:    Major depression, recurrent, severe. Axis II:   None. Axis III:  1. Hypothyroidism.            2. Status post right patellar fracture.            3. Asthma by history. Axis IV:   Moderate, support system and medical problems. Axis V:    Global assessment of functioning on discharge was 50-55. Dictated by:   Rulon Eisenmenger, M.D. Attending Physician:  Rulon Eisenmenger DD:  02/24/02 TD:  02/26/02 Job: [id number] [id number]

## 2011-02-15 NOTE — Telephone Encounter (Signed)
Needs pres for updated dose of Laxix . Phar needs new pres with correct dose indicated.  Call [facility] on [address]  Number is [phone].

## 2011-02-20 ENCOUNTER — Telehealth: Payer: Self-pay | Admitting: Internal Medicine

## 2011-02-20 MED ORDER — FUROSEMIDE 20 MG PO TABS: ORAL_TABLET | ORAL | Status: DC

## 2011-02-20 NOTE — Telephone Encounter (Signed)
Pt's lasix was increased from 1 tab to three a day, with instructions to increase to 1 extra pill if weight gain over 2 lbs, so will need a qty of 120 with refills-[facility] [address]-pharmacy req this Monday and pt now out-dtr req it be called in asap today

## 2011-03-07 ENCOUNTER — Telehealth: Payer: Self-pay | Admitting: Internal Medicine

## 2011-03-07 NOTE — Telephone Encounter (Signed)
50 mg metoprolol uses [facility]

## 2011-03-11 MED ORDER — METOPROLOL SUCCINATE ER 50 MG PO TB24: 50.0000 mg | ORAL_TABLET | Freq: Two times a day (BID) | ORAL | Status: DC

## 2011-03-11 NOTE — Telephone Encounter (Signed)
Medication was increased to Metoprolol to 50mg  one tablet twice daily

## 2011-03-29 ENCOUNTER — Ambulatory Visit: Payer: Medicare Other | Admitting: Internal Medicine

## 2011-04-19 ENCOUNTER — Other Ambulatory Visit: Payer: Self-pay | Admitting: Pulmonary Disease

## 2011-04-24 ENCOUNTER — Ambulatory Visit (INDEPENDENT_AMBULATORY_CARE_PROVIDER_SITE_OTHER): Payer: Medicare Other | Admitting: Internal Medicine

## 2011-04-24 ENCOUNTER — Encounter: Payer: Self-pay | Admitting: Internal Medicine

## 2011-04-24 VITALS — BP 100/60 | HR 80 | Ht 68.0 in | Wt 203.0 lb

## 2011-04-24 DIAGNOSIS — I5033 Acute on chronic diastolic (congestive) heart failure: Secondary | ICD-10-CM

## 2011-04-24 DIAGNOSIS — F329 Major depressive disorder, single episode, unspecified: Secondary | ICD-10-CM

## 2011-04-24 DIAGNOSIS — I4892 Unspecified atrial flutter: Secondary | ICD-10-CM

## 2011-04-24 NOTE — Assessment & Plan Note (Signed)
The patient has both afib and atrial flutter.  She is asymptomatic.  We will therefore continue rate control and chronic anticoagulation. Her creatinine clearance 4/12 was 44.  We will repeat BMET and CBC today.

## 2011-04-24 NOTE — Patient Instructions (Signed)
Your physician recommends that you schedule a follow-up appointment in: 3 months with Dr Rayann Heman  Your physician recommends that you return for lab work today  BMP/CBC/Lithium level

## 2011-04-24 NOTE — Progress Notes (Signed)
The patient presents today for routine electrophysiology followup.  Since last being seen in our clinic, the patient reports doing very well.  Her depression has improved.  She denies symptoms of atrial flutter or CHF.   Today, she denies symptoms of palpitations, chest pain, shortness of breath, orthopnea, PND, lower extremity edema, dizziness, presyncope, syncope, or neurologic sequela.  The patient feels that she is tolerating medications without difficulties and is otherwise without complaint today.   Past Medical History  Diagnosis Date  . Atrial fibrillation   . Atrial flutter   . Diastolic dysfunction   . Other and unspecified hyperlipidemia   . Unspecified hypothyroidism   . Overweight   . Diverticulosis of colon (without mention of hemorrhage)   . Benign neoplasm of colon   . Osteoarthrosis, unspecified whether generalized or localized, unspecified site   . Disorder of bone and cartilage, unspecified   . Depressive disorder, not elsewhere classified    Past Surgical History  Procedure Date  . Tonsillectomy     Current Outpatient Prescriptions  Medication Sig Dispense Refill  . albuterol (PROAIR HFA) 108 (90 BASE) MCG/ACT inhaler Inhale 2 puffs into the lungs every 6 (six) hours as needed.        Marland Kitchen buPROPion (WELLBUTRIN XL) 300 MG 24 hr tablet Take 300 mg by mouth daily.        . dabigatran (PRADAXA) 150 MG CAPS Take 150 mg by mouth every 12 (twelve) hours.        Marland Kitchen escitalopram (LEXAPRO) 20 MG tablet Take 20 mg by mouth as directed.        . furosemide (LASIX) 20 MG tablet 2 tabs in the morning and 1 tab in the evening may take an extra tab if needed  120 tablet  3  . levothyroxine (SYNTHROID, LEVOTHROID) 112 MCG tablet Take 112 mcg by mouth daily.        Marland Kitchen lithium (LITHOBID) 300 MG CR tablet Take 300 mg by mouth daily. 2 capsules daily.       . metoprolol (TOPROL-XL) 50 MG 24 hr tablet Take 1 tablet (50 mg total) by mouth 2 (two) times daily.  60 tablet  12  . OLANZapine  (ZYPREXA) 2.5 MG tablet Take 1 1/2 tablet by mouth once daily      . polyethylene glycol (MIRALAX / GLYCOLAX) packet Take 17 g by mouth 2 (two) times daily.        . potassium chloride SA (K-DUR,KLOR-CON) 20 MEQ tablet Take 1 tablet (20 mEq total) by mouth 2 (two) times daily.  180 tablet  3  . simvastatin (ZOCOR) 20 MG tablet Take 20 mg by mouth at bedtime.          No Known Allergies  History   Social History  . Marital Status: Married    Spouse Name: Clance Boll    Number of Children: 4  . Years of Education: N/A   Occupational History  .     Social History Main Topics  . Smoking status: Never Smoker   . Smokeless tobacco: Never Used  . Alcohol Use: No  . Drug Use: No  . Sexually Active: Not on file   Other Topics Concern  . Not on file   Social History Narrative  . No narrative on file    Family History  Problem Relation Age of Onset  . Heart failure Mother   . Arthritis Mother   . Heart disease Father   . Heart disease Other  Physical Exam: Filed Vitals:   04/24/11 1438  BP: 100/60  Pulse: 80  Height: 5\' 8"  (1.727 m)  Weight: 203 lb (92.08 kg)    GEN- The patient is overweight appearing, alert and oriented x 3 today.   Head- normocephalic, atraumatic Eyes-  Sclera clear, conjunctiva pink Ears- hearing intact Oropharynx- clear Neck- supple, no JVP Lymph- no cervical lymphadenopathy Lungs- Clear to ausculation bilaterally, normal work of breathing Heart- irregular rate and rhythm, no murmurs, rubs or gallops, PMI not laterally displaced GI- soft, NT, ND, + BS Extremities- no clubbing, cyanosis, or edema MS- no significant deformity or atrophy Skin- no rash or lesion Psych- euthymic mood, full affect Neuro- strength and sensation are intact  ekg today reveals typical appearing atrial flutter, V rate 80 bpm, nonspecific St/T changes  Assessment and Plan:

## 2011-04-24 NOTE — Assessment & Plan Note (Signed)
We will check lithium level today.

## 2011-04-24 NOTE — Assessment & Plan Note (Signed)
Stable No change required today  

## 2011-04-25 ENCOUNTER — Telehealth: Payer: Self-pay | Admitting: Pulmonary Disease

## 2011-04-25 LAB — CBC WITH DIFFERENTIAL/PLATELET
Eosinophils Absolute: 0.2 10*3/uL (ref 0.0–0.7)
Eosinophils Relative: 2.8 % (ref 0.0–5.0)
HCT: 41 % (ref 36.0–46.0)
Lymphs Abs: 2.1 10*3/uL (ref 0.7–4.0)
MCHC: 33.4 g/dL (ref 30.0–36.0)
MCV: 93.2 fl (ref 78.0–100.0)
Monocytes Absolute: 0.6 10*3/uL (ref 0.1–1.0)
Neutrophils Relative %: 51.9 % (ref 43.0–77.0)
Platelets: 197 10*3/uL (ref 150.0–400.0)
WBC: 6.2 10*3/uL (ref 4.5–10.5)

## 2011-04-25 LAB — BASIC METABOLIC PANEL
BUN: 21 mg/dL (ref 6–23)
CO2: 31 mEq/L (ref 19–32)
Chloride: 101 mEq/L (ref 96–112)
Creatinine, Ser: 1.4 mg/dL — ABNORMAL HIGH (ref 0.4–1.2)
Glucose, Bld: 87 mg/dL (ref 70–99)
Potassium: 4 mEq/L (ref 3.5–5.1)

## 2011-04-25 LAB — LITHIUM LEVEL: Lithium Lvl: 0.59 mEq/L — ABNORMAL LOW (ref 0.80–1.40)

## 2011-04-25 MED ORDER — SIMVASTATIN 20 MG PO TABS: 20.0000 mg | ORAL_TABLET | Freq: Every day | ORAL | Status: DC

## 2011-04-25 NOTE — Telephone Encounter (Signed)
Pt needs refill on simvastatin. Refill sent. Pt daughter aware.Fairview Bing, CMA

## 2011-04-30 ENCOUNTER — Telehealth: Payer: Self-pay | Admitting: Internal Medicine

## 2011-04-30 NOTE — Telephone Encounter (Signed)
Per pt daughter call regarding pradaxa. Pt daughter wanting to double check the dosage of pradaxa and the results of pt renal function test. Pt daughter said pt RX has one dogsage, pt is taking a different dosage, and then samples were given that were a dosage different than the RX and the dosage pt is taking. Please return call to daughter to discuss/advise.

## 2011-04-30 NOTE — Telephone Encounter (Signed)
When patient was in hospital in 11/2010 Dr Doreatha Lew decreased her Pradaxa to 75mg  bid  Do you want her to stay on this dose or go back to 150mg  bid  Please advise  Will fax to Shirlean Mylar  [phone]

## 2011-05-02 NOTE — Telephone Encounter (Signed)
Please repeat BMET to assess current creatinine clearance. Given her age, there is a reasonable change that 75mg  is the correct dosage depending on her CrCl.

## 2011-05-03 ENCOUNTER — Telehealth: Payer: Self-pay | Admitting: Internal Medicine

## 2011-05-03 NOTE — Telephone Encounter (Signed)
Pt daughter talk to [facility] Tuesday and was suppose to hear back from [facility] this week regarding dosing pradaxa and pt needs refill and she would like a call today regarding this.

## 2011-05-03 NOTE — Telephone Encounter (Signed)
Spoke with patient's daughter regarding Pradaxa dose. On 08/02 phone note Dr. Rayann Heman recommends for pt to stay on Pradaxa 75 mg. Patient daughter aware.

## 2011-05-04 NOTE — Telephone Encounter (Signed)
She just had a BMP on 04/24/11 HER CrCl is

## 2011-05-06 NOTE — Telephone Encounter (Signed)
Her CrCl is 48

## 2011-05-13 NOTE — Telephone Encounter (Signed)
Discussed with Dr Rayann Heman Pt to continue with 75mg  bid until she is done with her current bottle  Then we will recheck her BMP  If still stable she can increase back up to 150mg  bid  Daughter Shirlean Mylar aware

## 2011-07-04 ENCOUNTER — Other Ambulatory Visit: Payer: Self-pay

## 2011-07-04 MED ORDER — DABIGATRAN ETEXILATE MESYLATE 150 MG PO CAPS: 150.0000 mg | ORAL_CAPSULE | Freq: Two times a day (BID) | ORAL | Status: DC

## 2011-07-08 ENCOUNTER — Telehealth: Payer: Self-pay | Admitting: Internal Medicine

## 2011-07-08 NOTE — Telephone Encounter (Signed)
Pt daughter calling regarding incorrect dosage of pt pradaxa. Pt was on 150 mg when pt was d/c from hospital pt was put on 75 mg due to decreased renal function. RX called in was for 150 mg and pt needs the 75 mg of pradaxa called in.   Please call RX dosage change into [facility] on [address].

## 2011-07-09 NOTE — Telephone Encounter (Signed)
Pt calling back re pradaxa not being filled yet, 75mg , [facility]

## 2011-07-09 NOTE — Telephone Encounter (Signed)
Pt's daughter is calling back and med still not called into pharmacy for pradaxa. Pt is leaving for out of town on thursday

## 2011-07-10 ENCOUNTER — Encounter: Payer: Self-pay | Admitting: *Deleted

## 2011-07-10 ENCOUNTER — Telehealth: Payer: Self-pay | Admitting: Internal Medicine

## 2011-07-10 MED ORDER — DABIGATRAN ETEXILATE MESYLATE 150 MG PO CAPS: ORAL_CAPSULE | ORAL | Status: DC

## 2011-07-10 NOTE — Telephone Encounter (Signed)
Spoke with patient's daughter  They are out of the 75mg  bid doses and going out of town tomorrow.  She is due to come in on 07/09/11 for her follow up appointment  At that time we were going to check a BMP, if her CREAT had normalized we were going to increase her dose back up to 150 mg bid  Daughter aware and I will call in the 75mg  bid for her

## 2011-07-10 NOTE — Telephone Encounter (Signed)
This encounter was created in error - please disregard.

## 2011-07-10 NOTE — Telephone Encounter (Signed)
Please call about pradaxa she is upset that this has not been filled because her mom is going our of town and she has been trying to get rx for over a week

## 2011-07-16 NOTE — Telephone Encounter (Signed)
Error

## 2011-07-29 ENCOUNTER — Encounter: Payer: Self-pay | Admitting: Internal Medicine

## 2011-07-29 ENCOUNTER — Ambulatory Visit (INDEPENDENT_AMBULATORY_CARE_PROVIDER_SITE_OTHER): Payer: Medicare Other | Admitting: Internal Medicine

## 2011-07-29 DIAGNOSIS — N179 Acute kidney failure, unspecified: Secondary | ICD-10-CM

## 2011-07-29 DIAGNOSIS — R631 Polydipsia: Secondary | ICD-10-CM | POA: Insufficient documentation

## 2011-07-29 DIAGNOSIS — I4891 Unspecified atrial fibrillation: Secondary | ICD-10-CM

## 2011-07-29 DIAGNOSIS — I5033 Acute on chronic diastolic (congestive) heart failure: Secondary | ICD-10-CM

## 2011-07-29 DIAGNOSIS — Z79899 Other long term (current) drug therapy: Secondary | ICD-10-CM

## 2011-07-29 DIAGNOSIS — I4892 Unspecified atrial flutter: Secondary | ICD-10-CM

## 2011-07-29 MED ORDER — DABIGATRAN ETEXILATE MESYLATE 75 MG PO CAPS: 75.0000 mg | ORAL_CAPSULE | Freq: Two times a day (BID) | ORAL | Status: DC

## 2011-07-29 MED ORDER — FUROSEMIDE 20 MG PO TABS: ORAL_TABLET | ORAL | Status: DC

## 2011-07-29 NOTE — Assessment & Plan Note (Signed)
pradaxa 75mg  BID  (As above) BMET today UA today

## 2011-07-29 NOTE — Assessment & Plan Note (Signed)
Her Psychiatrist Pearson Grippe, MD) suspects possible elevation in lithium level and has asked that we obtain a lithium level today.   We will also check BMET, UA, and TSH. I have encouraged the patient to follow-up with PCP also.

## 2011-07-29 NOTE — Patient Instructions (Signed)
Your physician wants you to follow-up in: 6 months with Dr Vallery Ridge will receive a reminder letter in the mail two months in advance. If you don't receive a letter, please call our office to schedule the follow-up appointment.  Your physician recommends that you return for lab work today--will send to Dr Albertine Patricia

## 2011-07-29 NOTE — Progress Notes (Signed)
The patient presents today for routine electrophysiology followup.  Since last being seen in our clinic, the patient reports doing very well.  Her depression has improved.  She denies symptoms of atrial flutter or CHF.  She has noticed increased thirst recently.  Her Psychiatrists suspects possible elevation in lithium level and has asked that we obtain a lithium level today.   Today, she denies symptoms of palpitations, chest pain, shortness of breath, orthopnea, PND, lower extremity edema, dizziness, presyncope, syncope, or neurologic sequela.  The patient feels that she is tolerating medications without difficulties and is otherwise without complaint today.   Past Medical History  Diagnosis Date  . Atrial fibrillation   . Atrial flutter   . Diastolic dysfunction   . Other and unspecified hyperlipidemia   . Unspecified hypothyroidism   . Overweight   . Diverticulosis of colon (without mention of hemorrhage)   . Benign neoplasm of colon   . Osteoarthrosis, unspecified whether generalized or localized, unspecified site   . Disorder of bone and cartilage, unspecified   . Depressive disorder, not elsewhere classified    Past Surgical History  Procedure Date  . Tonsillectomy     Current Outpatient Prescriptions  Medication Sig Dispense Refill  . albuterol (PROAIR HFA) 108 (90 BASE) MCG/ACT inhaler Inhale 2 puffs into the lungs every 6 (six) hours as needed.        Marland Kitchen buPROPion (WELLBUTRIN XL) 300 MG 24 hr tablet Take 300 mg by mouth daily.        Marland Kitchen escitalopram (LEXAPRO) 20 MG tablet Take 20 mg by mouth as directed.        . furosemide (LASIX) 20 MG tablet 2 tabs in the morning and 1 tab in the evening may take an extra tab if needed  180 tablet  3  . levothyroxine (SYNTHROID, LEVOTHROID) 112 MCG tablet Take 112 mcg by mouth daily.        Marland Kitchen lithium (LITHOBID) 300 MG CR tablet Take 300 mg by mouth daily. 2 capsules daily.       . metoprolol (TOPROL-XL) 50 MG 24 hr tablet Take 1 tablet (50 mg  total) by mouth 2 (two) times daily.  60 tablet  12  . OLANZapine (ZYPREXA) 2.5 MG tablet Take 1 1/2 tablet by mouth once daily      . polyethylene glycol (MIRALAX / GLYCOLAX) packet Take 17 g by mouth 2 (two) times daily.        . potassium chloride SA (K-DUR,KLOR-CON) 20 MEQ tablet Take 1 tablet (20 mEq total) by mouth 2 (two) times daily.  180 tablet  3  . simvastatin (ZOCOR) 20 MG tablet Take 1 tablet (20 mg total) by mouth at bedtime.  30 tablet  6  . dabigatran (PRADAXA) 75 MG CAPS Take 1 capsule (75 mg total) by mouth every 12 (twelve) hours.  60 capsule  11    No Known Allergies  History   Social History  . Marital Status: Married    Spouse Name: Clance Boll    Number of Children: 4  . Years of Education: N/A   Occupational History  .     Social History Main Topics  . Smoking status: Never Smoker   . Smokeless tobacco: Never Used  . Alcohol Use: No  . Drug Use: No  . Sexually Active: Not on file   Other Topics Concern  . Not on file   Social History Narrative  . No narrative on file    Family History  Problem Relation Age of Onset  . Heart failure Mother   . Arthritis Mother   . Heart disease Father   . Heart disease Other    Physical Exam: Filed Vitals:   07/29/11 1450  BP: 110/62  Pulse: 78  Height: 5\' 8"  (1.727 m)  Weight: 206 lb (93.441 kg)    GEN- The patient is overweight appearing, alert and oriented x 3 today.   Head- normocephalic, atraumatic Eyes-  Sclera clear, conjunctiva pink Ears- hearing intact Oropharynx- clear Neck- supple, no JVP Lymph- no cervical lymphadenopathy Lungs- Clear to ausculation bilaterally, normal work of breathing Heart- irregular rate and rhythm, no murmurs, rubs or gallops, PMI not laterally displaced GI- soft, NT, ND, + BS Extremities- no clubbing, cyanosis, or edema MS- no significant deformity or atrophy Skin- no rash or lesion Psych- euthymic mood, full affect, appears much happier than on previous  visits Neuro- strength and sensation are intact  ekg today reveals typical appearing atrial flutter, V rate 78 bpm, nonspecific St/T changes  Assessment and Plan:

## 2011-07-29 NOTE — Assessment & Plan Note (Signed)
The patient has both afib and atrial flutter.  She is asymptomatic.  We will therefore continue rate control and chronic anticoagulation. Due to frequent increases in creatinine (decreased CrCL), we will continue pradaxa 75mg  BID. BMET today

## 2011-07-29 NOTE — Assessment & Plan Note (Signed)
euvolemic today Continue lasix bmet today

## 2011-07-30 LAB — BASIC METABOLIC PANEL
CO2: 29 mEq/L (ref 19–32)
Calcium: 9.4 mg/dL (ref 8.4–10.5)
Creatinine, Ser: 1.5 mg/dL — ABNORMAL HIGH (ref 0.4–1.2)
GFR: 35.37 mL/min — ABNORMAL LOW (ref >60.00)
Glucose, Bld: 101 mg/dL — ABNORMAL HIGH (ref 70–99)

## 2011-07-30 LAB — URINALYSIS, ROUTINE W REFLEX MICROSCOPIC
Nitrite: NEGATIVE
Specific Gravity, Urine: 1.01 (ref 1.000–1.030)
Total Protein, Urine: NEGATIVE
Urine Glucose: NEGATIVE
Urobilinogen, UA: 0.2 (ref 0.0–1.0)

## 2011-07-31 ENCOUNTER — Telehealth: Payer: Self-pay | Admitting: Internal Medicine

## 2011-07-31 NOTE — Telephone Encounter (Signed)
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

## 2011-08-02 MED ORDER — FUROSEMIDE 20 MG PO TABS: ORAL_TABLET | ORAL | Status: DC

## 2011-08-02 NOTE — Telephone Encounter (Signed)
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

## 2011-08-05 NOTE — Telephone Encounter (Signed)
Labs faxed

## 2011-08-06 ENCOUNTER — Ambulatory Visit: Payer: Medicare Other | Admitting: Pulmonary Disease

## 2011-08-16 ENCOUNTER — Ambulatory Visit (INDEPENDENT_AMBULATORY_CARE_PROVIDER_SITE_OTHER): Payer: Medicare Other | Admitting: Pulmonary Disease

## 2011-08-16 ENCOUNTER — Encounter: Payer: Self-pay | Admitting: Pulmonary Disease

## 2011-08-16 DIAGNOSIS — M1990 Unspecified osteoarthritis, unspecified site: Secondary | ICD-10-CM

## 2011-08-16 DIAGNOSIS — Z23 Encounter for immunization: Secondary | ICD-10-CM

## 2011-08-16 DIAGNOSIS — M949 Disorder of cartilage, unspecified: Secondary | ICD-10-CM

## 2011-08-16 DIAGNOSIS — I4892 Unspecified atrial flutter: Secondary | ICD-10-CM

## 2011-08-16 DIAGNOSIS — E039 Hypothyroidism, unspecified: Secondary | ICD-10-CM

## 2011-08-16 DIAGNOSIS — E785 Hyperlipidemia, unspecified: Secondary | ICD-10-CM

## 2011-08-16 DIAGNOSIS — I679 Cerebrovascular disease, unspecified: Secondary | ICD-10-CM

## 2011-08-16 DIAGNOSIS — K5730 Diverticulosis of large intestine without perforation or abscess without bleeding: Secondary | ICD-10-CM

## 2011-08-16 DIAGNOSIS — D126 Benign neoplasm of colon, unspecified: Secondary | ICD-10-CM

## 2011-08-16 DIAGNOSIS — F329 Major depressive disorder, single episode, unspecified: Secondary | ICD-10-CM

## 2011-08-16 DIAGNOSIS — E663 Overweight: Secondary | ICD-10-CM

## 2011-08-16 DIAGNOSIS — I872 Venous insufficiency (chronic) (peripheral): Secondary | ICD-10-CM

## 2011-08-16 DIAGNOSIS — I5033 Acute on chronic diastolic (congestive) heart failure: Secondary | ICD-10-CM

## 2011-08-16 NOTE — Patient Instructions (Signed)
Today we updated your med list in our EPIC system...    Continue your current medications the same...  We reviewed your recent lab work from [facility]...  For your dripping nose:    Try the ASTEPRO samples- spray 1 sp in each nostril twice daily as needed...  We gave you the TDAP combination Tetanus vaccine today (good for 10 yrs)...  Call for any questions...  Let's plan a follow up visit in 6 months w/ FASTING blood work at that time.Marland KitchenMarland Kitchen

## 2011-08-16 NOTE — Progress Notes (Signed)
Subjective:    Patient ID: Sonya Kaufman, female    DOB: 07-19-1931, 77 y.o.   MRN: [id number]  HPI 77 y/o WF here for a follow up visit... she has mult med problems as noted below... she is followed by DrSteiner for Psychiatry w/ severe depression on 5 diff meds as below...  ~  Mar11:  80mo f/u visit- she's had some worsening depression & meds adjusted per DrSteiner w/ Lithium incr to Bid & Deplin 15mg /d added... she would like full thyroid function tests to be sure that the current dose of Synthroid (169mcg/d) is adeq replacement Rx for her... otherw feeling satis & stable on current meds.  **Note: TFT's WNL but low-end & we decided to incr Synthroid to 160mcg/d...  ~  August 03, 2010:  routine ROV feeling OK & w/o any new complaints or concerns... exam shows irreg irreg heart w/ AFib on EKG, rate 106 (80-120 range)... daughter indicates strong FamHx AFib in several relatives but Sonya Kaufman hasn't had this before... Thyroid dose was incr to 131mcg/d last OV & we will recheck labs, check CXR, & refer to Cards for 2DEcho & Rx... for now start low dose BBlocker Rx.  ~  Feb 08, 2011:  29mo ROV & post hosp visit> she was hosp 2/12 by [facility] w/ RLL Pneum & CHF, then had to be readmit 3/12 by Cards w/ acute on chronic diastolic CHF w/ recurrent PAF;  She had f/u DrAllred  3/12 w/ meds adjusted- SEE BELOW...  Thyroid medication had to be adjusted & currently on Synthroid 182mcg/d & doing satis...  ~  August 16, 2011:  62mo ROV & her CC is nasal congestion, otherw feels she is doing satis, mood sl improved as well on meds from DrSteiner; she had f/u DrAllred 10/12 & was essent asymptomatic w/o CP, palpit, ch in SOB, edema etc; EKG in AFlutter on rate control & Pradaxa, CHF controlled w/ Lasix, Labs were checked> BUN=17, Creat=1.5, TSH=0.93, Lithium level= 0.62 (0.80-1.40)... She declines further labs today...  She already had the 2012 Flu vaccine, and review of record indicates that she is due a TDAP today-  done...  See prob list below>>         Problem List:  ASTHMATIC BRONCHITIS, ACUTE (ICD-466.0) - on PROAIR as needed; she is a non-smoker> denies cough, sputum, hemoptysis, worsening dyspnea,  wheezing, chest pains, snoring, daytime hypersomnolence, etc...  ~  CXR 11/11 showed cardiomeg, clear lungs, NAD.Marland Kitchen. ~  CXR 3/12 showed cardiomeg, vasc congestion, mild interst edema> c/w mild CHF...  ACUTE ON CHRONIC DIASTOLIC HEART FAILURE> on METOPROLOL ER 50mg Bid, LASIX 20mg - 2AM & 1PM, KCl 63mEq Bid,and PRADAXA 75mg  bid... ATRIAL FIBRILLATION & ATRIAL FLUTTER - new onset noted 11/11 OV & pt asymptomatic w/o CP, palpit, dizzy, SOB, etc> referred to DrAllred. ~  She has had freq med adjustments in the interval by Cards/ DrAllred> on & off Cardizem, up & down on Pradaxa Loss adjuster, chartered & Epic reviewed). ~  5/12:  They report current meds: METOPROLOL 25mg - 2Bid, LASIX 20mg - 2AM & 1PM, KCl 18mEq Bid,and PRADAXA 150mg  bid. ~  11/12:  She had recent check up w/ DrAllred & meds now listed> METOPROLOL ER 50mg Bid, LASIX 20mg - 2AM & 1PM, KCl 44mEq Bid,and PRADAXA 75mg  bid.  CEREBROVASCULAR DISEASE (ICD-437.9) - on PRADAXA 75mg  Bid...  ~  prev eval by DrLove in 2003... CT showed bilat sm vessel ischemic disease... prev on ASA 81mg /d.  VENOUS INSUFFICIENCY (ICD-459.81) - she takes LASIX as above & follows a  low sodium diet and uses support hose as needed...  HYPERLIPIDEMIA (ICD-272.4) - on SIMVASTATIN 20mg /d + FishOil 1000mg /d... ~  [place] 11/07 showed TChol 140, Tg 90, HDL 52, LDL 70... ~   [facility] 5/09 showed TChol 181, TG 105, HDL 51, LDL 110... rec- same med, better diet for now. ~  FLP 9/10 showed TChol 174, TG 129, HDL 63, LDL 86 ~  She needs to return FASTING for FLP recheck... ~  [place] 3/12 showed TChol 121, TG 58, HDL 57, LDL 52... Note LFTs sl elev w/ SGOT 67, SGPT 74...  HYPOTHYROIDISM (ICD-244.9) - currently on SYNTHROID 153mcg/d... ~  prev TSH 7/08 was 3.56 ~  labs 5/09 showed TSH= 2.06 ~  labs 9/10 showed  TSH= 2.30 ~  labs 3/11 showed TSH= 3.13, FreeT3= 2.4 (2.3-4.2), FreeT4= 0.9 (0.6-1.6)... rec incr to 129mcg/d. ~  labs 11/11 on Levoth125 showed TSH= 0.92... keep same for now. ~  During the 2/12 hosp she was cut back to 126mcg/d dose & she says she feels better on this dose. ~  Labs 3/12 showed TSH= 5.80 & reminded to take med every day... ~  Labs 10/12 showed TSH= 0.62  OVERWEIGHT (ICD-278.02) - diet + exercise program discussed... ~  weight 5/09 = 206# ~  weight 9/10 = 206# ~  weight 3/11 = 213# ~  weight 11/11 = 209# ~  Weight 5/12 = 206# ~  Weight 11/12 = 207#  DIVERTICULOSIS OF COLON (ICD-562.10) COLONIC POLYPS (ICD-211.3) - she had routine colonoscopy 10/10 by DrStark showing several 4-61mm polyps & mild divertics- path showed one tubular adenoma & f/u planned 51yrs.  OSTEOARTHRITIS (ICD-715.90) - hx knee pain in past, now c/o intermiitent right hip pain... she never filled Mobic Rx & uses OTC pain meds Prn.  OSTEOPENIA (ICD-733.90) - I can't find prev BMD in the system... takes Calcium, MVI, Vit D... ~  labs 5/09 showed Vit D level = 31... rec- 1000 u Vit D OTC supplement.  DEPRESSION (ICD-311) - This has been her major problem over the years... several psyche hospitalizations and one OD admit... followed by DrSteiner every 2-3 months, on several meds & adjusted frequently... Currently on WELLBUTRIN XL 300,  LEXAPRO 20,  ZYPREXA 2.5 (taking 1.5 tabs daily), &  LITHOBID incr to 2 tabs daily... ~  9/10:  she reports doing very well on her regimen- stable w/o acute problems. ~  3/11:  reports incr depressive symptoms- meds adjusted & Deplin added by DrSteiner. ~  11/11: reports stable on her 5 med regimen... she & husb continue to care for their 47 y/o son w/ CP. ~  5/12: she is currently on 4 meds from DrSteiner, husb ill, kids having to care for them + handicaped brother... ~  11/12: current meds listed as> WELLBUTRIN XL 300,  LEXAPRO 20,  ZYPREXA 2.5 (taking 1.5 tabs daily), &   LITHOBID incr to 2 tabs daily...  HEALTH MAINTENANCE: ~  GI:  Followed by DrStark w/ colon 2010 as above... ~  GYN:   ~  Immunizations:  She gets the yearly Flu vaccine;  She had Pneumovax prev;  Given TDAP 11/12 in office...   Past Surgical History  Procedure Date  . Tonsillectomy     Outpatient Encounter Prescriptions as of 08/16/2011  Medication Sig Dispense Refill  . albuterol (PROAIR HFA) 108 (90 BASE) MCG/ACT inhaler Inhale 2 puffs into the lungs every 6 (six) hours as needed.        Marland Kitchen buPROPion (WELLBUTRIN XL) 300 MG 24  hr tablet Take 300 mg by mouth daily.        . dabigatran (PRADAXA) 75 MG CAPS Take 1 capsule (75 mg total) by mouth every 12 (twelve) hours.  60 capsule  11  . escitalopram (LEXAPRO) 20 MG tablet Take 20 mg by mouth as directed.        . furosemide (LASIX) 20 MG tablet 2 tabs in the morning and 1 tab in the evening may take an extra tab if needed  120 tablet  4  . levothyroxine (SYNTHROID, LEVOTHROID) 112 MCG tablet Take 112 mcg by mouth daily.        Marland Kitchen lithium (LITHOBID) 300 MG CR tablet Take 300 mg by mouth daily. 2 capsules daily.       . metoprolol (TOPROL-XL) 50 MG 24 hr tablet Take 1 tablet (50 mg total) by mouth 2 (two) times daily.  60 tablet  12  . OLANZapine (ZYPREXA) 2.5 MG tablet Take 1 1/2 tablet by mouth once daily      . polyethylene glycol (MIRALAX / GLYCOLAX) packet Take 17 g by mouth 2 (two) times daily.        . potassium chloride SA (K-DUR,KLOR-CON) 20 MEQ tablet Take 1 tablet (20 mEq total) by mouth 2 (two) times daily.  180 tablet  3  . simvastatin (ZOCOR) 20 MG tablet Take 1 tablet (20 mg total) by mouth at bedtime.  30 tablet  6    No Known Allergies   Current Medications, Allergies, Past Medical History, Past Surgical History, Family History, and Social History were reviewed in [employer] record.    Review of Systems         See HPI - all other systems neg except as noted... The patient complains of SOB/  DOE, & depression.  The patient denies anorexia, fever, weight loss, weight gain, vision loss, decreased hearing, hoarseness, chest pain, syncope, peripheral edema, prolonged cough, headaches, hemoptysis, abdominal pain, melena, hematochezia, severe indigestion/heartburn, hematuria, incontinence, muscle weakness, suspicious skin lesions, transient blindness, difficulty walking, unusual weight change, abnormal bleeding, enlarged lymph nodes, and angioedema.     Objective:   Physical Exam     WD, Overweight, 77 y/o WF in NAD... she has a flat affect... Vital Signs:  Reviewed... GENERAL:  Alert & oriented; pleasant & cooperative... HEENT:  [place]/AT, EOM-wnl, PERRLA, EACs-clear, TMs-wnl, NOSE-clear, THROAT-clear & wnl. NECK:  Supple w/ fairROM; no JVD; normal carotid impulses w/o bruits; no thyromegaly or nodules palpated; no lymphadenopathy. CHEST:  Clear to P & A; without wheezes/ rales/ or rhonchi heard... HEART:  Irreg rhythm w/ variable VR; without murmurs/ rubs/ or gallops detected... ABDOMEN:  Obese, soft & nontender; normal bowel sounds; no organomegaly or masses palpated... EXT: without deformities, mild arthritic changes; no varicose veins/ +venous insuffic/ no edema. NEURO:  CN's intact;  no focal neuro deficits... DERM:  No lesions noted; no rash etc...   Assessment & Plan:   CARDIAC>  Followed by DrAllred- Diastolic CHF, AFib/Flutter, etc>  Stable on Pradaxa, Toprol, Lasix... Continue same...  LIPIDS>  She is tol Simva20 + FishOil very well> needs to ret FASTING for f/u FLP & check LFTs...  HYPOTHY>  On Syntroid 127mcg/d & stable clinically; she states that she feels better on this dose...  Overweight>  We discussed diet + exercise program...  GI>  Stable...  ORTHO>  DJD, Osteopenia> she uses OTC analgesics prn but needs Calcium, MVI, Vit D supplementation...  DEPRESSION>  A major prob for her, she sees  DrSteiner for meds, continue regular f/u w/ Psychiatry.Marland KitchenMarland Kitchen

## 2011-08-25 ENCOUNTER — Encounter: Payer: Self-pay | Admitting: Pulmonary Disease

## 2011-10-24 ENCOUNTER — Other Ambulatory Visit: Payer: Medicare Other

## 2011-10-24 ENCOUNTER — Other Ambulatory Visit: Payer: Self-pay | Admitting: Pulmonary Disease

## 2011-10-24 DIAGNOSIS — F329 Major depressive disorder, single episode, unspecified: Secondary | ICD-10-CM

## 2011-10-24 DIAGNOSIS — F3289 Other specified depressive episodes: Secondary | ICD-10-CM

## 2011-10-25 LAB — LITHIUM LEVEL: Lithium Lvl: 0.92 mEq/L (ref 0.80–1.40)

## 2011-11-17 ENCOUNTER — Other Ambulatory Visit: Payer: Self-pay

## 2011-11-17 ENCOUNTER — Encounter (HOSPITAL_COMMUNITY): Payer: Self-pay | Admitting: *Deleted

## 2011-11-17 ENCOUNTER — Emergency Department (HOSPITAL_COMMUNITY): Payer: Medicare Other

## 2011-11-17 ENCOUNTER — Inpatient Hospital Stay (HOSPITAL_COMMUNITY)
Admission: EM | Admit: 2011-11-17 | Discharge: 2011-11-21 | DRG: 291 | Disposition: A | Discharge status: Home / Self Care | Payer: Medicare Other | Attending: Cardiology | Admitting: Cardiology

## 2011-11-17 DIAGNOSIS — E785 Hyperlipidemia, unspecified: Secondary | ICD-10-CM | POA: Diagnosis present

## 2011-11-17 DIAGNOSIS — M1990 Unspecified osteoarthritis, unspecified site: Secondary | ICD-10-CM | POA: Diagnosis present

## 2011-11-17 DIAGNOSIS — I5032 Chronic diastolic (congestive) heart failure: Secondary | ICD-10-CM | POA: Insufficient documentation

## 2011-11-17 DIAGNOSIS — I4892 Unspecified atrial flutter: Secondary | ICD-10-CM | POA: Diagnosis present

## 2011-11-17 DIAGNOSIS — N189 Chronic kidney disease, unspecified: Secondary | ICD-10-CM | POA: Diagnosis present

## 2011-11-17 DIAGNOSIS — J9600 Acute respiratory failure, unspecified whether with hypoxia or hypercapnia: Secondary | ICD-10-CM | POA: Diagnosis present

## 2011-11-17 DIAGNOSIS — F3289 Other specified depressive episodes: Secondary | ICD-10-CM | POA: Diagnosis present

## 2011-11-17 DIAGNOSIS — N179 Acute kidney failure, unspecified: Secondary | ICD-10-CM

## 2011-11-17 DIAGNOSIS — Z79899 Other long term (current) drug therapy: Secondary | ICD-10-CM

## 2011-11-17 DIAGNOSIS — I872 Venous insufficiency (chronic) (peripheral): Secondary | ICD-10-CM | POA: Diagnosis present

## 2011-11-17 DIAGNOSIS — F329 Major depressive disorder, single episode, unspecified: Secondary | ICD-10-CM | POA: Diagnosis present

## 2011-11-17 DIAGNOSIS — J9691 Respiratory failure, unspecified with hypoxia: Secondary | ICD-10-CM

## 2011-11-17 DIAGNOSIS — E663 Overweight: Secondary | ICD-10-CM | POA: Diagnosis present

## 2011-11-17 DIAGNOSIS — I4891 Unspecified atrial fibrillation: Secondary | ICD-10-CM

## 2011-11-17 DIAGNOSIS — I509 Heart failure, unspecified: Secondary | ICD-10-CM

## 2011-11-17 DIAGNOSIS — I5033 Acute on chronic diastolic (congestive) heart failure: Principal | ICD-10-CM

## 2011-11-17 DIAGNOSIS — E876 Hypokalemia: Secondary | ICD-10-CM | POA: Diagnosis not present

## 2011-11-17 DIAGNOSIS — E039 Hypothyroidism, unspecified: Secondary | ICD-10-CM | POA: Diagnosis present

## 2011-11-17 HISTORY — DX: Chronic diastolic (congestive) heart failure: I50.32

## 2011-11-17 HISTORY — DX: Disorder of kidney and ureter, unspecified: N28.9

## 2011-11-17 HISTORY — DX: Venous insufficiency (chronic) (peripheral): I87.2

## 2011-11-17 LAB — BASIC METABOLIC PANEL
BUN: 19 mg/dL (ref 6–23)
CO2: 30 mEq/L (ref 19–32)
Chloride: 100 mEq/L (ref 96–112)
GFR calc non Af Amer: 31 mL/min — ABNORMAL LOW (ref >90)
Glucose, Bld: 142 mg/dL — ABNORMAL HIGH (ref 70–99)
Potassium: 3.8 mEq/L (ref 3.5–5.1)
Sodium: 136 mEq/L (ref 135–145)

## 2011-11-17 LAB — HEPATIC FUNCTION PANEL
ALT: 106 U/L — ABNORMAL HIGH (ref 0–35)
Albumin: 3.4 g/dL — ABNORMAL LOW (ref 3.5–5.2)
Alkaline Phosphatase: 88 U/L (ref 39–117)
Indirect Bilirubin: 0.4 mg/dL (ref 0.3–0.9)
Total Bilirubin: 0.5 mg/dL (ref 0.3–1.2)
Total Protein: 6.3 g/dL (ref 6.0–8.3)

## 2011-11-17 LAB — POCT I-STAT, CHEM 8
Chloride: 103 mEq/L (ref 96–112)
Creatinine, Ser: 1.4 mg/dL — ABNORMAL HIGH (ref 0.50–1.10)
HCT: 39 % (ref 36.0–46.0)
Hemoglobin: 13.3 g/dL (ref 12.0–15.0)
Potassium: 3.9 mEq/L (ref 3.5–5.1)
Sodium: 140 mEq/L (ref 135–145)

## 2011-11-17 LAB — CARDIAC PANEL(CRET KIN+CKTOT+MB+TROPI)
CK, MB: 2.4 ng/mL (ref 0.3–4.0)
Relative Index: INVALID (ref 0.0–2.5)
Total CK: 50 U/L (ref 7–177)
Total CK: 48 U/L (ref 7–177)

## 2011-11-17 LAB — DIFFERENTIAL
Eosinophils Absolute: 0.1 10*3/uL (ref 0.0–0.7)
Lymphs Abs: 1.7 10*3/uL (ref 0.7–4.0)
Monocytes Absolute: 0.9 10*3/uL (ref 0.1–1.0)
Monocytes Relative: 8 % (ref 3–12)
Neutrophils Relative %: 74 % (ref 43–77)

## 2011-11-17 LAB — PROTIME-INR
INR: 1.18 (ref 0.00–1.49)
Prothrombin Time: 15.2 seconds (ref 11.6–15.2)

## 2011-11-17 LAB — CBC
HCT: 39.7 % (ref 36.0–46.0)
Hemoglobin: 12.7 g/dL (ref 12.0–15.0)
MCH: 30.6 pg (ref 26.0–34.0)
MCHC: 32 g/dL (ref 30.0–36.0)
RBC: 4.15 MIL/uL (ref 3.87–5.11)

## 2011-11-17 LAB — POCT I-STAT TROPONIN I

## 2011-11-17 MED ORDER — DABIGATRAN ETEXILATE MESYLATE 75 MG PO CAPS
75.0000 mg | ORAL_CAPSULE | Freq: Two times a day (BID) | ORAL | Status: DC
  Administered 2011-11-17 – 2011-11-21 (×9): 75 mg via ORAL
  Filled 2011-11-17 (×10): qty 1

## 2011-11-17 MED ORDER — BUPROPION HCL ER (XL) 300 MG PO TB24
300.0000 mg | ORAL_TABLET | Freq: Every day | ORAL | Status: DC
  Administered 2011-11-17 – 2011-11-21 (×5): 300 mg via ORAL
  Filled 2011-11-17 (×5): qty 1

## 2011-11-17 MED ORDER — LITHIUM CARBONATE ER 450 MG PO TBCR
750.0000 mg | EXTENDED_RELEASE_TABLET | Freq: Every day | ORAL | Status: DC
  Administered 2011-11-17 – 2011-11-21 (×5): 750 mg via ORAL
  Filled 2011-11-17 (×5): qty 1

## 2011-11-17 MED ORDER — OLANZAPINE 2.5 MG PO TABS
1.2500 mg | ORAL_TABLET | Freq: Every day | ORAL | Status: DC
  Administered 2011-11-17 – 2011-11-20 (×4): 1.25 mg via ORAL
  Filled 2011-11-17 (×5): qty 0.5

## 2011-11-17 MED ORDER — ACETAMINOPHEN 325 MG PO TABS
650.0000 mg | ORAL_TABLET | ORAL | Status: DC | PRN
Start: 2011-11-17 — End: 2011-11-21

## 2011-11-17 MED ORDER — ESCITALOPRAM OXALATE 20 MG PO TABS
20.0000 mg | ORAL_TABLET | Freq: Every day | ORAL | Status: DC
  Administered 2011-11-17 – 2011-11-21 (×5): 20 mg via ORAL
  Filled 2011-11-17 (×5): qty 1

## 2011-11-17 MED ORDER — SODIUM CHLORIDE 0.9 % IV SOLN: 250.0000 mL | INTRAVENOUS | Status: DC | PRN

## 2011-11-17 MED ORDER — FUROSEMIDE 10 MG/ML IJ SOLN
40.0000 mg | Freq: Three times a day (TID) | INTRAMUSCULAR | Status: DC
  Administered 2011-11-17 – 2011-11-20 (×9): 40 mg via INTRAVENOUS
  Filled 2011-11-17 (×12): qty 4

## 2011-11-17 MED ORDER — METOPROLOL SUCCINATE ER 25 MG PO TB24
25.0000 mg | ORAL_TABLET | Freq: Two times a day (BID) | ORAL | Status: DC
  Administered 2011-11-17 – 2011-11-19 (×4): 25 mg via ORAL
  Filled 2011-11-17 (×5): qty 1

## 2011-11-17 MED ORDER — SODIUM CHLORIDE 0.9 % IJ SOLN: 3.0000 mL | INTRAMUSCULAR | Status: DC | PRN

## 2011-11-17 MED ORDER — ALBUTEROL SULFATE HFA 108 (90 BASE) MCG/ACT IN AERS
2.0000 | INHALATION_SPRAY | Freq: Four times a day (QID) | RESPIRATORY_TRACT | Status: DC | PRN
  Administered 2011-11-17 – 2011-11-18 (×3): 2 via RESPIRATORY_TRACT
  Filled 2011-11-17: qty 6.7

## 2011-11-17 MED ORDER — ALPRAZOLAM 0.25 MG PO TABS: 0.2500 mg | ORAL_TABLET | Freq: Two times a day (BID) | ORAL | Status: DC | PRN

## 2011-11-17 MED ORDER — IOHEXOL 300 MG/ML  SOLN
80.0000 mL | Freq: Once | INTRAMUSCULAR | Status: AC | PRN
  Administered 2011-11-17: 80 mL via INTRAVENOUS

## 2011-11-17 MED ORDER — FUROSEMIDE 10 MG/ML IJ SOLN
40.0000 mg | INTRAMUSCULAR | Status: AC
  Administered 2011-11-17: 40 mg via INTRAVENOUS
  Filled 2011-11-17: qty 4

## 2011-11-17 MED ORDER — SODIUM CHLORIDE 0.9 % IV SOLN
Freq: Once | INTRAVENOUS | Status: AC
  Administered 2011-11-17: 06:00:00 via INTRAVENOUS

## 2011-11-17 MED ORDER — SIMVASTATIN 20 MG PO TABS
20.0000 mg | ORAL_TABLET | Freq: Every day | ORAL | Status: DC
  Administered 2011-11-17 – 2011-11-20 (×4): 20 mg via ORAL
  Filled 2011-11-17 (×5): qty 1

## 2011-11-17 MED ORDER — SODIUM CHLORIDE 0.9 % IJ SOLN
3.0000 mL | Freq: Two times a day (BID) | INTRAMUSCULAR | Status: DC
  Administered 2011-11-17 – 2011-11-21 (×9): 3 mL via INTRAVENOUS

## 2011-11-17 MED ORDER — POTASSIUM CHLORIDE CRYS ER 20 MEQ PO TBCR
20.0000 meq | EXTENDED_RELEASE_TABLET | Freq: Two times a day (BID) | ORAL | Status: DC
  Administered 2011-11-17 – 2011-11-21 (×8): 20 meq via ORAL
  Filled 2011-11-17 (×10): qty 1

## 2011-11-17 MED ORDER — ONDANSETRON HCL 4 MG/2ML IJ SOLN: 4.0000 mg | Freq: Four times a day (QID) | INTRAMUSCULAR | Status: DC | PRN

## 2011-11-17 MED ORDER — ASPIRIN 81 MG PO CHEW
324.0000 mg | CHEWABLE_TABLET | Freq: Once | ORAL | Status: AC
  Administered 2011-11-17: 324 mg via ORAL
  Filled 2011-11-17: qty 4

## 2011-11-17 MED ORDER — LEVOTHYROXINE SODIUM 112 MCG PO TABS
112.0000 ug | ORAL_TABLET | Freq: Every day | ORAL | Status: DC
  Administered 2011-11-17 – 2011-11-21 (×5): 112 ug via ORAL
  Filled 2011-11-17 (×5): qty 1

## 2011-11-17 NOTE — H&P (Signed)
History and Physical  Patient ID: Sonya Kaufman MRN: [id number], SOB: Feb 24, 1931 78 y.o. Date of Encounter: 11/17/2011, 10:10 AM  Primary Physician: Noralee Space, MD, MD Primary Cardiologist: Allred  Chief Complaint: SOB  HPI: 78 y/o F with hx of afib/flutter, diastolic CHF presented to [facility] with complaints of SOB and was found to be hypoxic with O2 sats 75% on RA. She has had a forthy cough the past 2 days and overnight had progressive dyspnea, abdominal swelling to the point where her daughter (who is a Marine scientist at [employer]) took her to the ER. These symptoms were consistent with previous CHF exacerbation except she usually swells in LE but has not this time. She has a hard time qualifying if she has had any orthopnea, just didn't sleep well last night. No CP but did have a brief episode of indigestion yesterday followed by vomiting. Has occasional dizzy spells, no syncope. No fever or chills. HR was upper 40s-60s arrival to ER with elevated BP. CT angio ruled out PE but did show CHF. Cr is 1.56 (previously 1.5 07/2011) pBNP 957. Glucose is 142. She received 40mg  IV Lasix and 324mg  of Aspirin, still doing some belly breathing and feels uncomfortable.  Past Medical History  Diagnosis Date  . Atrial fibrillation   . Atrial flutter   . Diastolic dysfunction   . Other and unspecified hyperlipidemia   . Unspecified hypothyroidism   . Overweight   . Diverticulosis of colon (without mention of hemorrhage)   . Benign neoplasm of colon   . Osteoarthrosis, unspecified whether generalized or localized, unspecified site   . Disorder of bone and cartilage, unspecified   . Depressive disorder, not elsewhere classified   . Venous insufficiency     2D echo Study Conclusions 08/2010 - Left ventricle: The cavity size was normal. Wall thickness was normal. Systolic function was normal. The estimated ejection fraction was in the range of 55% to 60%. Wall motion was normal; there were no regional wall motion  abnormalities. - Left atrium: The atrium was moderately dilated. - Right atrium: The atrium was moderately dilated. Transthoracic echocardiography. M-mode, complete 2D, spectral  Surgical History:  Past Surgical History  Procedure Date  . Tonsillectomy      Home Meds: Medication Sig  albuterol (PROAIR HFA) 108 (90 BASE) MCG/ACT inhaler Inhale 2 puffs into the lungs every 6 (six) hours as needed.    buPROPion (WELLBUTRIN XL) 300 MG 24 hr tablet Take 300 mg by mouth daily.    dabigatran (PRADAXA) 75 MG CAPS Take 1 capsule (75 mg total) by mouth every 12 (twelve) hours.  escitalopram (LEXAPRO) 20 MG tablet Take 20 mg by mouth as directed.    furosemide (LASIX) 20 MG tablet 2 tabs in the morning and 1 tab in the evening may take an extra tab if needed  levothyroxine (SYNTHROID, LEVOTHROID) 112 MCG tablet Take 112 mcg by mouth daily.    lithium (LITHOBID) 300 MG CR tablet Take 750 mg by mouth daily. 2 1/2  tablets daily.  metoprolol (TOPROL-XL) 50 MG 24 hr tablet Take 1 tablet (50 mg total) by mouth 2 (two) times daily.  OLANZapine (ZYPREXA) 2.5 MG tablet Take 1.25 mg by mouth at bedtime. Take 1 1/2 tablet by mouth once daily  polyethylene glycol (MIRALAX / GLYCOLAX) packet Take 17 g by mouth 2 (two) times daily.    potassium chloride SA (K-DUR,KLOR-CON) 20 MEQ tablet Take 1 tablet (20 mEq total) by mouth 2 (two) times daily.  simvastatin (ZOCOR) 20  MG tablet Take 1 tablet (20 mg total) by mouth at bedtime.    Allergies: No Known Allergies  History   Social History  . Marital Status: Married    Spouse Name: Clance Boll    Number of Children: 4  . Years of Education: N/A   Occupational History  .     Social History Main Topics  . Smoking status: Never Smoker   . Smokeless tobacco: Never Used  . Alcohol Use: No  . Drug Use: No  . Sexually Active: Not on file    Family History  Problem Relation Age of Onset  . Heart failure Mother   . Arthritis Mother   . Heart disease Father     . Heart disease Other     Review of Systems: General: negative for chills, fever, night sweats.pt unsure of weight changes. Cardiovascular: see above.  Dermatological: negative for rash Respiratory: negative for wheezing. +cough frothy white Urologic: negative for hematuria Abdominal: negative for diarrhea, bright red blood per rectum, melena, or hematemesis . +n/v yesterday Neurologic: negative for visual changes, syncope. +dizziness. Psych: No SI/HI All other systems reviewed and are otherwise negative except as noted above.  Labs:   Lab Results  Component Value Date   WBC 10.3 11/17/2011   HGB 13.3 11/17/2011   HCT 39.0 11/17/2011   MCV 95.7 11/17/2011   PLT 224 11/17/2011    Lab 11/17/11 0535 11/17/11 0524  NA 140 --  K 3.9 --  CL 103 --  CO2 -- 30  BUN 20 --  CREATININE 1.40* --  CALCIUM -- 10.1  PROT -- --  BILITOT -- --  ALKPHOS -- --  ALT -- --  AST -- --  GLUCOSE 140* --  above bmet represents Istat. F/u Cr 1.56. Glu 142. poc troponin neg x1  Radiology/Studies:  1. Ct Angio Chest W/cm &/or Wo Cm 11/17/2011  *RADIOLOGY REPORT*  Clinical Data: Shortness of breath and wheezing  CT ANGIOGRAPHY CHEST  Technique:  Multidetector CT imaging of the chest using the standard protocol during bolus administration of intravenous contrast. Multiplanar reconstructed images including MIPs were obtained and reviewed to evaluate the vascular anatomy.  Contrast: 27mL OMNIPAQUE IOHEXOL 300 MG/ML IV SOLN  Comparison: Chest radiograph 11/17/2011  Findings: This is a technically satisfactory evaluation of the pulmonary arteries.  No focal filling defects are identified in the pulmonary arterial tree.  Central pulmonary arteries are normal in size.  There is moderate cardiomegaly.  Several mediastinal lymph nodes are present, but not pathologically enlarged. There is nonpathologically enlarged hilar nodal tissue. No enlarged lymph nodes. The thoracic aorta is normal in caliber and contains  scattered areas of atherosclerotic calcification.  No significant contrast is seen within the aorta at the time of the examination.  There is a very small right, and a trace left pleural effusion. Negative for pericardial effusion.  Slight intralobular septal thickening in the lung apices, and more prominent septal thickening at the lung bases.  There are scattered ground-glass opacities bilaterally.  Findings are consistent with mild pulmonary edema.  There is no consolidation.  No acute bony abnormality.  IMPRESSION:  1. CHF.  Cardiomegaly, mild pulmonary edema, and very small right and trace left pleural effusions. 2.  Negative for pulmonary embolism.  Original Report Authenticated By: Curlene Dolphin, M.D.   2. Dg Chest Port 1 View 11/17/2011  *RADIOLOGY REPORT*  Clinical Data: Shortness of breath; atrial fibrillation.  PORTABLE CHEST - 1 VIEW  Comparison: Chest radiograph performed 12/03/2010  Findings: The lungs are well-aerated.  Mild bibasilar airspace opacities raise question for minimal interstitial edema, given underlying vascular congestion.  Trace fluid or atelectasis is noted along the right minor fissure.  There is no evidence of pneumothorax.  The cardiomediastinal silhouette is mildly enlarged.  No acute osseous abnormalities are seen.  IMPRESSION: Mild bibasilar airspace opacities raise question for minimal interstitial edema, given underlying vascular congestion and mild cardiomegaly.  Trace fluid or atelectasis along the right minor fissure.  Original Report Authenticated By: Santa Lighter, M.D.    EKG: sinus bradycardia HR 52 no acute changes  Physical Exam: Blood pressure 136/74, pulse 55, temperature 98.6 F (37 C), temperature source Oral, resp. rate 21, height 5\' 8"  (1.727 m), weight 204 lb 2.3 oz (92.6 kg), SpO2 96.00%. General: Well developed, well nourished overweight WF elderly in no acute distress but she is using abdominal muscles for breathing. Feels more comfortable sitting  forward. Head: Normocephalic, atraumatic, sclera non-icteric, no xanthomas, nares are without discharge.  Neck: Negative for carotid bruits. JVD is elevated (somewhat difficult to assess given neck habitus) Lungs: Decreased BS at bases. Faint crackles L side above bases, otherwise coarse. Faint expiratory wheeze on first auscultation but none heard further.  Heart: Slow, regular with S1 S2. No murmurs, rubs, or gallops appreciated. Abdomen: Soft, non-tender, slightly distended although pt is overweight with normoactive bowel sounds. No hepatomegaly. No rebound/guarding. No obvious abdominal masses. Msk:  Strength and tone appear normal for age. Extremities: No clubbing or cyanosis. No edema.  Distal pedal pulses are 2+ and equal bilaterally. Neuro: Alert and oriented X 3. Moves all extremities spontaneously. Psych:  Responds to questions appropriately with a normal affect.     ASSESSMENT AND PLAN:   1. Acute hypoxic respiratory failure likely 2/2 diastolic CHF exacerbation 2. H/o atrial arrhythmias, currently maintaining NSR/sinus brady 3. Chronic renal insufficiency  The patient's presentation is most consistent with diastolic CHF exacerbation at this time. The trigger is unclear although she was hypertensive on presentation to [facility]. We will admit, cycle enzymes, & further diurese while paying close attention to her renal function. She had a CTA by the [facility] to rule out PE so given chronic renal insufficiency we will have to watch Cr for CIN. Will decrease metoprolol to 25mg  po bid given bradycardia. May need an ischemic rule out but would start with Myoview unless she rules in. WBC is normal and she is afebrile so doubt infection at this time. Will continue Pradaxa at renal dosing.  Signed, Melina Copa PA-C 11/17/2011, 10:10 AM  Patient seen with PA, agree with note.  Patient has had 2 days of increasing dyspnea and nausea.  She is admitted with volume overload, diastolic CHF exacerbation.  No  PE on CT.   1. Diastolic CHF: Needs diuresis.  Lasix 40 mg IV q 8 hrs (takes 40 qam, 20 qpm at home).  Will get echo.  Uncertain of cause of exacerbation: no evidence for infection, no chest pain.  Will cycle cardiac enzymes.  If rules out, probably outpatient myoview.  2. Atrial fibrillation: Paroxysmal.  In sinus brady.  Decrease metoprolol dose.  Continue renal dosing of Pradaxa.  3. CKD: Got contrast with CTA chest today.  Will need to follow renal function carefully with diuresis.   Loralie Champagne 11/17/2011 10:50 AM

## 2011-11-17 NOTE — ED Provider Notes (Signed)
History     CSN: [id number]  Arrival date & time 11/17/11  0456   First MD Initiated Contact with Patient 11/17/11 0501      Chief Complaint  Patient presents with  . Shortness of Breath  . Wheezing    (Consider location/radiation/quality/duration/timing/severity/associated sxs/prior treatment) HPI Comments: 78 year old female with a history of congestive heart failure, atrial flutter, venous insufficiency, hyperlipidemia, hypothyroidism who presents with a complaint of shortness of breath.  SOB started in teh last 6-12 hours, has been constant, gradually getting worse, severe and is assocaited with coughing up some frothy white sputum this evening.  She has had no swelling in the legs, denies chest pain, swelling, rash, fever, nausea or vomiting. According to the family member the patient had an admission approximately one year ago for congestive heart failure. She has otherwise been doing well on her home medications.  .Dr. Rayann Heman is her Cardiologist  Patient is a 78 y.o. female presenting with shortness of breath and wheezing. The history is provided by the patient, a relative and the nursing home.  Shortness of Breath  Associated symptoms include shortness of breath and wheezing.  Wheezing  Associated symptoms include shortness of breath and wheezing.    Past Medical History  Diagnosis Date  . Atrial fibrillation   . Atrial flutter   . Diastolic dysfunction   . Other and unspecified hyperlipidemia   . Unspecified hypothyroidism   . Overweight   . Diverticulosis of colon (without mention of hemorrhage)   . Benign neoplasm of colon   . Osteoarthrosis, unspecified whether generalized or localized, unspecified site   . Disorder of bone and cartilage, unspecified   . Depressive disorder, not elsewhere classified     Past Surgical History  Procedure Date  . Tonsillectomy     Family History  Problem Relation Age of Onset  . Heart failure Mother   . Arthritis Mother   .  Heart disease Father   . Heart disease Other     History  Substance Use Topics  . Smoking status: Never Smoker   . Smokeless tobacco: Never Used  . Alcohol Use: No    OB History    Grav Para Term Preterm Abortions TAB SAB Ect Mult Living                  Review of Systems  Respiratory: Positive for shortness of breath and wheezing.   All other systems reviewed and are negative.    Allergies  Review of patient's allergies indicates no known allergies.  Home Medications   Current Outpatient Rx  Name Route Sig Dispense Refill  . ALBUTEROL SULFATE HFA 108 (90 BASE) MCG/ACT IN AERS Inhalation Inhale 2 puffs into the lungs every 6 (six) hours as needed.      . BUPROPION HCL ER (XL) 300 MG PO TB24 Oral Take 300 mg by mouth daily.      Marland Kitchen DABIGATRAN ETEXILATE MESYLATE 75 MG PO CAPS Oral Take 1 capsule (75 mg total) by mouth every 12 (twelve) hours. 60 capsule 11  . ESCITALOPRAM OXALATE 20 MG PO TABS Oral Take 20 mg by mouth as directed.      . FUROSEMIDE 20 MG PO TABS  2 tabs in the morning and 1 tab in the evening may take an extra tab if needed 120 tablet 4  . LEVOTHYROXINE SODIUM 112 MCG PO TABS Oral Take 112 mcg by mouth daily.      Marland Kitchen LITHIUM CARBONATE ER 300 MG PO TBCR  Oral Take 750 mg by mouth daily. 2 1/2  tablets daily.    Marland Kitchen METOPROLOL SUCCINATE ER 50 MG PO TB24 Oral Take 1 tablet (50 mg total) by mouth 2 (two) times daily. 60 tablet 12  . OLANZAPINE 2.5 MG PO TABS Oral Take 1.25 mg by mouth at bedtime. Take 1 1/2 tablet by mouth once daily    . POLYETHYLENE GLYCOL 3350 PO PACK Oral Take 17 g by mouth 2 (two) times daily.      Marland Kitchen POTASSIUM CHLORIDE CRYS ER 20 MEQ PO TBCR Oral Take 1 tablet (20 mEq total) by mouth 2 (two) times daily. 180 tablet 3  . SIMVASTATIN 20 MG PO TABS Oral Take 1 tablet (20 mg total) by mouth at bedtime. 30 tablet 6    BP 122/49  Pulse 47  Temp(Src) 98.4 F (36.9 C) (Oral)  Resp 21  SpO2 97%  Physical Exam  Nursing note and vitals  reviewed. Constitutional: She appears well-developed and well-nourished. No distress.  HENT:  Head: Normocephalic and atraumatic.  Mouth/Throat: Oropharynx is clear and moist. No oropharyngeal exudate.  Eyes: Conjunctivae and EOM are normal. Pupils are equal, round, and reactive to light. Right eye exhibits no discharge. Left eye exhibits no discharge. No scleral icterus.  Neck: Normal range of motion. Neck supple. No JVD present. No thyromegaly present.  Cardiovascular: Normal rate, regular rhythm, normal heart sounds and intact distal pulses.  Exam reveals no gallop and no friction rub.   No murmur heard. Pulmonary/Chest: She is in respiratory distress.       Increased work of breathing, tachypnea, accessory muscle use. Intermittent rales, no wheezing, decreased breath sounds bilaterally, speaks in one to 2 word sentences  Abdominal: Soft. Bowel sounds are normal. She exhibits no distension and no mass. There is no tenderness.  Musculoskeletal: Normal range of motion. She exhibits no edema and no tenderness.  Lymphadenopathy:    She has no cervical adenopathy.  Neurological: She is alert. Coordination normal.  Skin: Skin is warm and dry. No rash noted. No erythema.  Psychiatric: She has a normal mood and affect. Her behavior is normal.    ED Course  Procedures (including critical care time)  ED ECG REPORT   Date: 11/17/2011   Rate: 61  Rhythm: normal sinus rhythm  QRS Axis: normal  Intervals: PR prolonged  ST/T Wave abnormalities: normal  Conduction Disutrbances:first-degree A-V block   Narrative Interpretation:   Old EKG Reviewed: changes noted and Since 07/29/2011, atrial flutter has been replaced with normal sinus rhythm, no other significant changes seen   Labs Reviewed  BASIC METABOLIC PANEL - Abnormal; Notable for the following:    Glucose, Bld 142 (*)    Creatinine, Ser 1.56 (*)    GFR calc non Af Amer 31 (*)    GFR calc Af Amer 36 (*)    All other components within  normal limits  APTT - Abnormal; Notable for the following:    aPTT 40 (*)    All other components within normal limits  PRO B NATRIURETIC PEPTIDE - Abnormal; Notable for the following:    Pro B Natriuretic peptide (BNP) 957.4 (*)    All other components within normal limits  POCT I-STAT, CHEM 8 - Abnormal; Notable for the following:    Creatinine, Ser 1.40 (*)    Glucose, Bld 140 (*)    All other components within normal limits  CBC  DIFFERENTIAL  PROTIME-INR  POCT I-STAT TROPONIN I   Ct Angio Chest W/cm &/  or Wo Cm  11/17/2011  *RADIOLOGY REPORT*  Clinical Data: Shortness of breath and wheezing  CT ANGIOGRAPHY CHEST  Technique:  Multidetector CT imaging of the chest using the standard protocol during bolus administration of intravenous contrast. Multiplanar reconstructed images including MIPs were obtained and reviewed to evaluate the vascular anatomy.  Contrast: 21mL OMNIPAQUE IOHEXOL 300 MG/ML IV SOLN  Comparison: Chest radiograph 11/17/2011  Findings: This is a technically satisfactory evaluation of the pulmonary arteries.  No focal filling defects are identified in the pulmonary arterial tree.  Central pulmonary arteries are normal in size.  There is moderate cardiomegaly.  Several mediastinal lymph nodes are present, but not pathologically enlarged. There is nonpathologically enlarged hilar nodal tissue. No enlarged lymph nodes. The thoracic aorta is normal in caliber and contains scattered areas of atherosclerotic calcification.  No significant contrast is seen within the aorta at the time of the examination.  There is a very small right, and a trace left pleural effusion. Negative for pericardial effusion.  Slight intralobular septal thickening in the lung apices, and more prominent septal thickening at the lung bases.  There are scattered ground-glass opacities bilaterally.  Findings are consistent with mild pulmonary edema.  There is no consolidation.  No acute bony abnormality.   IMPRESSION:  1. CHF.  Cardiomegaly, mild pulmonary edema, and very small right and trace left pleural effusions. 2.  Negative for pulmonary embolism.  Original Report Authenticated By: Curlene Dolphin, M.D.   Dg Chest Port 1 View  11/17/2011  *RADIOLOGY REPORT*  Clinical Data: Shortness of breath; atrial fibrillation.  PORTABLE CHEST - 1 VIEW  Comparison: Chest radiograph performed 12/03/2010  Findings: The lungs are well-aerated.  Mild bibasilar airspace opacities raise question for minimal interstitial edema, given underlying vascular congestion.  Trace fluid or atelectasis is noted along the right minor fissure.  There is no evidence of pneumothorax.  The cardiomediastinal silhouette is mildly enlarged.  No acute osseous abnormalities are seen.  IMPRESSION: Mild bibasilar airspace opacities raise question for minimal interstitial edema, given underlying vascular congestion and mild cardiomegaly.  Trace fluid or atelectasis along the right minor fissure.  Original Report Authenticated By: Santa Lighter, M.D.     1. Congestive heart failure       MDM  No peripheral edema, patient appears to be in respiratory distress requiring nonrebreather for oxygenation. Initially sats were in the 75% range on room air. Evaluate with EKG, chest x-ray, labs, aspirin.  D/w Dr. Aundra Dubin - will accept in transfer to [facility] for further treatment - lasix given pt discharnge.    Johnna Acosta, MD 11/17/11 [phone]

## 2011-11-17 NOTE — ED Notes (Signed)
Pt had EKG order at [id number] and wasn't click off by former tech. Dr Sabra Heck informed that he had got the EKG and I Millianna Szymborski click the order off.

## 2011-11-17 NOTE — ED Notes (Signed)
Report given to [facility]. Pt and pt family aware  [facility] two minutes away.

## 2011-11-17 NOTE — ED Notes (Signed)
Secretary aware of need for cardiology consult.

## 2011-11-17 NOTE — ED Notes (Signed)
Report called to 4700 to [facility] and [facility] contacted for transport

## 2011-11-17 NOTE — ED Notes (Signed)
Pt c/o shortness of breath and presents w/ weakness and audible wheezing.

## 2011-11-17 NOTE — ED Notes (Signed)
Attempted to call report to 4700, but nurse currently off the floor. Will call Tasha Back

## 2011-11-18 DIAGNOSIS — I4891 Unspecified atrial fibrillation: Secondary | ICD-10-CM

## 2011-11-18 DIAGNOSIS — I509 Heart failure, unspecified: Secondary | ICD-10-CM

## 2011-11-18 DIAGNOSIS — N179 Acute kidney failure, unspecified: Secondary | ICD-10-CM

## 2011-11-18 DIAGNOSIS — I5033 Acute on chronic diastolic (congestive) heart failure: Principal | ICD-10-CM

## 2011-11-18 LAB — BASIC METABOLIC PANEL
CO2: 31 mEq/L (ref 19–32)
Chloride: 103 mEq/L (ref 96–112)
Sodium: 142 mEq/L (ref 135–145)

## 2011-11-18 LAB — CBC
HCT: 39.2 % (ref 36.0–46.0)
Hemoglobin: 12.5 g/dL (ref 12.0–15.0)
MCV: 96.1 fL (ref 78.0–100.0)
RBC: 4.08 MIL/uL (ref 3.87–5.11)
WBC: 8.9 10*3/uL (ref 4.0–10.5)

## 2011-11-18 LAB — CARDIAC PANEL(CRET KIN+CKTOT+MB+TROPI): Relative Index: 2.5 (ref 0.0–2.5)

## 2011-11-18 NOTE — Progress Notes (Signed)
*  PRELIMINARY RESULTS* Echocardiogram 2D Echocardiogram has been performed.  ASMI, KURTH 11/18/2011, 4:19 PM

## 2011-11-18 NOTE — Progress Notes (Signed)
Dc'd foley catheter per nursing protocol. Will continue to monitor pt closely

## 2011-11-18 NOTE — Progress Notes (Signed)
SUBJECTIVE: The patient is doing well today.   She denies CP.  SOB is gradually improving.   Marland Kitchen buPROPion  300 mg Oral Daily  . dabigatran  75 mg Oral Q12H  . escitalopram  20 mg Oral Daily  . furosemide  40 mg Intravenous STAT  . furosemide  40 mg Intravenous Q8H  . levothyroxine  112 mcg Oral Daily  . lithium carbonate  750 mg Oral Daily  . metoprolol succinate  25 mg Oral BID  . OLANZapine  1.25 mg Oral QHS  . potassium chloride SA  20 mEq Oral BID  . simvastatin  20 mg Oral QHS  . sodium chloride  3 mL Intravenous Q12H      OBJECTIVE: Physical Exam: Filed Vitals:   11/17/11 1336 11/17/11 2140 11/17/11 2218 11/18/11 0527  BP: 158/72  140/64 97/62  Pulse: 54  110 104  Temp: 98 F (36.7 C)  99.6 F (37.6 C) 98.4 F (36.9 C)  TempSrc: Oral  Oral Oral  Resp: 20  24 20   Height:      Weight:    201 lb 11.2 oz (91.491 kg)  SpO2: 95% 96% 93% 96%    Intake/Output Summary (Last 24 hours) at 11/18/11 [id number] Last data filed at 11/18/11 0802  Gross per 24 hour  Intake   1212 ml  Output   4425 ml  Net  -3213 ml    Telemetry reveals afib with V rates 90s  GEN- The patient is well appearing, alert and oriented x 3 today.   Head- normocephalic, atraumatic Eyes-  Sclera clear, conjunctiva pink Ears- hearing intact Oropharynx- clear Neck- supple, no JVP Lymph- no cervical lymphadenopathy Lungs- bibasilar rales 1/3 way up Heart- irregular rate and rhythm  GI- soft, NT, ND, + BS Extremities- no clubbing, cyanosis, or edema   LABS: Basic Metabolic Panel:  Basename 11/18/11 0308 11/17/11 0535 11/17/11 0524  NA 142 140 --  K 3.5 3.9 --  CL 103 103 --  CO2 31 -- 30  GLUCOSE 114* 140* --  BUN 16 20 --  CREATININE 1.39* 1.40* --  CALCIUM 9.5 -- 10.1  MG -- -- --  PHOS -- -- --   Liver Function Tests:  Basename 11/17/11 1145  AST 67*  ALT 106*  ALKPHOS 88  BILITOT 0.5  PROT 6.3  ALBUMIN 3.4*   No results found for this basename: LIPASE:2,AMYLASE:2 in the last 72  hours CBC:  Basename 11/18/11 0308 11/17/11 0535 11/17/11 0524  WBC 8.9 -- 10.3  NEUTROABS -- -- 7.6  HGB 12.5 13.3 --  HCT 39.2 39.0 --  MCV 96.1 -- 95.7  PLT 177 -- 224   Cardiac Enzymes:  Basename 11/18/11 0312 11/17/11 1835 11/17/11 1145  CKTOTAL 151 48 50  CKMB 3.8 1.9 2.4  CKMBINDEX -- -- --  TROPONINI <0.30 <0.30 <0.30   BNP: No components found with this basename: POCBNP:3 D-Dimer: No results found for this basename: DDIMER:2 in the last 72 hours Hemoglobin A1C:  Basename 11/17/11 1145  HGBA1C 5.6   Fasting Lipid Panel: No results found for this basename: CHOL,HDL,LDLCALC,TRIG,CHOLHDL,LDLDIRECT in the last 72 hours Thyroid Function Tests:  Basename 11/17/11 1145  TSH 1.161  T4TOTAL --  T3FREE --  THYROIDAB --   Anemia Panel: No results found for this basename: VITAMINB12,FOLATE,FERRITIN,TIBC,IRON,RETICCTPCT in the last 72 hours  RADIOLOG ASSESSMENT AND PLAN:  Active Problems:  * No active hospital problems. *   1. Acute hypoxic respiratory failure likely 2/2 diastolic CHF exacerbation  2. Paroxysmal afib  3. Chronic renal insufficiency  The patient's presentation is most consistent with diastolic CHF exacerbation at this time.  CMs are negative and she has no ischemic symptoms Echo is pending   Will continue Pradaxa at renal dosing.  Continue Lasix 40 mg IV q 8 hrs  Consider outpatient myoview once CHF is resolved.    Thompson Grayer, MD 11/18/2011 8:33 AM

## 2011-11-18 NOTE — Plan of Care (Signed)
Problem: Food- and Nutrition-Related Knowledge Deficit (NB-1.1) Goal: Nutrition education Formal process to instruct or train a patient/client in a skill or to impart knowledge to help patients/clients voluntarily manage or modify food choices and eating behavior to maintain or improve health.  Outcome: Completed/Met Date Met:  11/18/11 RD consulted for CHF diet education. RD spoke with patient who stated she did not know if she had previous diet education. RD provided the patient with heart failure booklet and when over entire book. Patient states she tries to follow a low sodium diet at home, "I dont salt my vegetables when I cook". RD emphasized using fresh/frozed foods to avoid hidden salt. Patient verbalized understanding of recommendations. No additional questions. Chart reviewed, no additional nutrition interventions at this time.   Sonya Kaufman

## 2011-11-19 LAB — BASIC METABOLIC PANEL
BUN: 17 mg/dL (ref 6–23)
CO2: 30 mEq/L (ref 19–32)
Chloride: 101 mEq/L (ref 96–112)
Creatinine, Ser: 1.4 mg/dL — ABNORMAL HIGH (ref 0.50–1.10)

## 2011-11-19 MED ORDER — METOPROLOL SUCCINATE ER 50 MG PO TB24
50.0000 mg | ORAL_TABLET | Freq: Two times a day (BID) | ORAL | Status: DC
  Administered 2011-11-19 – 2011-11-20 (×3): 50 mg via ORAL
  Filled 2011-11-19 (×5): qty 1

## 2011-11-19 NOTE — Progress Notes (Signed)
SUBJECTIVE: The patient is doing well today.   She denies CP.  SOB is gradually improving.    Marland Kitchen buPROPion  300 mg Oral Daily  . dabigatran  75 mg Oral Q12H  . escitalopram  20 mg Oral Daily  . furosemide  40 mg Intravenous Q8H  . levothyroxine  112 mcg Oral Daily  . lithium carbonate  750 mg Oral Daily  . metoprolol succinate  25 mg Oral BID  . OLANZapine  1.25 mg Oral QHS  . potassium chloride SA  20 mEq Oral BID  . simvastatin  20 mg Oral QHS  . sodium chloride  3 mL Intravenous Q12H      OBJECTIVE: Physical Exam: Filed Vitals:   11/19/11 0213 11/19/11 0540 11/19/11 0955 11/19/11 1415  BP: 135/76 111/78 119/82 131/81  Pulse: 98 78 89 94  Temp: 98.6 F (37 C) 98.9 F (37.2 C) 98 F (36.7 C) 97.1 F (36.2 C)  TempSrc: Oral Oral Oral Oral  Resp: 18 18 18 18   Height:      Weight:  198 lb 3.2 oz (89.903 kg)    SpO2: 96% 99% 98% 100%    Intake/Output Summary (Last 24 hours) at 11/19/11 1728 Last data filed at 11/19/11 1722  Gross per 24 hour  Intake   1117 ml  Output   2476 ml  Net  -1359 ml    Telemetry reveals afib with V rates 90s  GEN- The patient is well appearing, alert and oriented x 3 today.   Head- normocephalic, atraumatic Eyes-  Sclera clear, conjunctiva pink Ears- hearing intact Oropharynx- clear Neck- supple, no JVP Lymph- no cervical lymphadenopathy Lungs- bibasilar rales improving Heart- irregular rate and rhythm  GI- soft, NT, ND, + BS Extremities- no clubbing, cyanosis, or edema   LABS: Basic Metabolic Panel:  Basename 11/19/11 0700 11/18/11 0308  NA 141 142  K 3.6 3.5  CL 101 103  CO2 30 31  GLUCOSE 134* 114*  BUN 17 16  CREATININE 1.40* 1.39*  CALCIUM 10.0 9.5  MG -- --  PHOS -- --   Liver Function Tests:  Basename 11/17/11 1145  AST 67*  ALT 106*  ALKPHOS 88  BILITOT 0.5  PROT 6.3  ALBUMIN 3.4*   No results found for this basename: LIPASE:2,AMYLASE:2 in the last 72 hours CBC:  Basename 11/18/11 0308 11/17/11 0535  11/17/11 0524  WBC 8.9 -- 10.3  NEUTROABS -- -- 7.6  HGB 12.5 13.3 --  HCT 39.2 39.0 --  MCV 96.1 -- 95.7  PLT 177 -- 224   Cardiac Enzymes:  Basename 11/18/11 0312 11/17/11 1835 11/17/11 1145  CKTOTAL 151 48 50  CKMB 3.8 1.9 2.4  CKMBINDEX -- -- --  TROPONINI <0.30 <0.30 <0.30   Basename 11/17/11 1145  HGBA1C 5.6   Fasting Lipid Panel: No results found for this basename: CHOL,HDL,LDLCALC,TRIG,CHOLHDL,LDLDIRECT in the last 72 hours Thyroid Function Tests:  Basename 11/17/11 1145  TSH 1.161  T4TOTAL --  T3FREE --  THYROIDAB --   Anemia Panel: No results found for this basename: VITAMINB12,FOLATE,FERRITIN,TIBC,IRON,RETICCTPCT in the last 72 hours  RADIOLOG ASSESSMENT AND PLAN:  Active Problems:  Atrial fibrillation  Acute on chronic diastolic heart failure  ACUTE KIDNEY FAILURE UNSPECIFIED  1. Acute hypoxic respiratory failure likely 2/2 diastolic CHF exacerbation  2. Paroxysmal afib  3. Chronic renal insufficiency  The patient's presentation is most consistent with diastolic CHF exacerbation at this time.  Echo reveals stable EF Will continue Pradaxa at renal dosing.  Continue  Lasix 40 mg IV q 8 hrs for another 24 hours and hopefully transition to PO lasix tomorrow Start PT and wean O2 [place] to DC to home in 48 hours  Consider outpatient myoview once CHF is resolved.    Thompson Grayer, MD 11/19/2011 5:28 PM

## 2011-11-19 NOTE — Plan of Care (Signed)
Problem: Phase I Progression Outcomes Goal: EF % per last Echo/documented,Core Reminder form on chart Outcome: Completed/Met Date Met:  11/19/11 EF 60-65% per ECHO performed on 11/18/11.

## 2011-11-20 ENCOUNTER — Other Ambulatory Visit: Payer: Self-pay

## 2011-11-20 LAB — BASIC METABOLIC PANEL
CO2: 30 mEq/L (ref 19–32)
Calcium: 9.7 mg/dL (ref 8.4–10.5)
Creatinine, Ser: 1.45 mg/dL — ABNORMAL HIGH (ref 0.50–1.10)
Glucose, Bld: 125 mg/dL — ABNORMAL HIGH (ref 70–99)
Sodium: 140 mEq/L (ref 135–145)

## 2011-11-20 MED ORDER — FUROSEMIDE 40 MG PO TABS
40.0000 mg | ORAL_TABLET | Freq: Two times a day (BID) | ORAL | Status: DC
  Administered 2011-11-20 (×2): 40 mg via ORAL
  Filled 2011-11-20 (×5): qty 1

## 2011-11-20 MED ORDER — POTASSIUM CHLORIDE CRYS ER 20 MEQ PO TBCR
40.0000 meq | EXTENDED_RELEASE_TABLET | Freq: Once | ORAL | Status: AC
  Administered 2011-11-20: 40 meq via ORAL

## 2011-11-20 NOTE — Progress Notes (Signed)
11/20/11 1415 UR Completed. Llana Aliment, RN, BSN [phone]

## 2011-11-20 NOTE — Evaluation (Signed)
[facility] [phone] (pager)

## 2011-11-20 NOTE — Progress Notes (Signed)
pts hr dropped down to 37 nonsustained then went back up to upper 50's. Rhythm changed from afib to NSR. Dr Radford Pax made aware of hr dropping to 37 and change in rhythm No orders received. Observed pt closely. Pt asymptomatic.

## 2011-11-20 NOTE — Progress Notes (Signed)
    SUBJECTIVE: Breathing better. No chest pains.   BP 146/77  Pulse 64  Temp(Src) 98.6 F (37 C) (Oral)  Resp 20  Ht 5\' 8"  (1.727 m)  Wt 197 lb 3.2 oz (89.449 kg)  BMI 29.98 kg/m2  SpO2 97%  Intake/Output Summary (Last 24 hours) at 11/20/11 [id number] Last data filed at 11/20/11 [id number]  Gross per 24 hour  Intake   1110 ml  Output   1851 ml  Net   -741 ml    PHYSICAL EXAM General: Well developed, well nourished, in no acute distress. Alert and oriented x 3.  Psych:  Good affect, responds appropriately Neck: No JVD. No masses noted.  Lungs: Clear bilaterally with no wheezes or rhonci noted.  Heart: RRR with no murmurs noted. Abdomen: Bowel sounds are present. Soft, non-tender.  Extremities: No lower extremity edema.   LABS: Basic Metabolic Panel:  Basename 11/20/11 0525 11/19/11 0700  NA 140 141  K 3.4* 3.6  CL 102 101  CO2 30 30  GLUCOSE 125* 134*  BUN 20 17  CREATININE 1.45* 1.40*  CALCIUM 9.7 10.0  MG -- --  PHOS -- --   CBC:  Basename 11/18/11 0308  WBC 8.9  NEUTROABS --  HGB 12.5  HCT 39.2  MCV 96.1  PLT 177   Cardiac Enzymes:  Basename 11/18/11 0312 11/17/11 1835 11/17/11 1145  CKTOTAL 151 48 50  CKMB 3.8 1.9 2.4  CKMBINDEX -- -- --  TROPONINI <0.30 <0.30 <0.30    Current Meds:    . buPROPion  300 mg Oral Daily  . dabigatran  75 mg Oral Q12H  . escitalopram  20 mg Oral Daily  . furosemide  40 mg Intravenous Q8H  . levothyroxine  112 mcg Oral Daily  . lithium carbonate  750 mg Oral Daily  . metoprolol succinate  50 mg Oral BID  . OLANZapine  1.25 mg Oral QHS  . potassium chloride SA  20 mEq Oral BID  . simvastatin  20 mg Oral QHS  . sodium chloride  3 mL Intravenous Q12H  . DISCONTD: metoprolol succinate  25 mg Oral BID   Echo: 11/18/11:  Left ventricle: Technically very difficult study- interpretation limited The cavity size was normal. Wall thickness was normal. Systolic function was normal. The estimated ejection fraction was in the  range of 60% to 65%. Regional wall motion abnormalities cannot be excluded. - Pericardium, extracardiac: There was no pericardial effusion.    ASSESSMENT AND PLAN:  1. Atrial fibrillation,paroxysmal: Pt converted to NSR around 2am. Continue Pradaxa for anticoagulation and Toprol for rate control.   2. Acute respiratory insufficiency secondary to acute on chronic diastolic CHF: She has diuresed well with IV Lasix. Will convert to po Lasix today.   3. Chronic renal insufficiency: Stable with diuresis. Recheck renal function in am.   4. Hypokalemia: Will replace with KCl today.   Rivers Hamrick  2/20/20137:22 AM

## 2011-11-20 NOTE — Evaluation (Addendum)
Physical Therapy Evaluation Patient Details Name: Sonya Kaufman MRN: [id number] DOB: July 24, 1931 Today's Date: 11/20/2011  Problem List:  Patient Active Problem List  Diagnoses  . COLONIC POLYPS  . HYPOTHYROIDISM  . HYPERLIPIDEMIA  . OVERWEIGHT  . DEPRESSION  . Atrial fibrillation  . CEREBROVASCULAR DISEASE  . VENOUS INSUFFICIENCY  . ASTHMATIC BRONCHITIS, ACUTE  . DIVERTICULOSIS OF COLON  . CONSTIPATION  . OSTEOARTHRITIS  . OSTEOPENIA  . Acute on chronic diastolic heart failure  . ACUTE KIDNEY FAILURE UNSPECIFIED  . Atrial flutter --RVR  . Atrial flutter  . Polydipsia    Past Medical History:  Past Medical History  Diagnosis Date  . Atrial fibrillation   . Atrial flutter   . Diastolic dysfunction   . Other and unspecified hyperlipidemia   . Unspecified hypothyroidism   . Overweight   . Diverticulosis of colon (without mention of hemorrhage)   . Benign neoplasm of colon   . Osteoarthrosis, unspecified whether generalized or localized, unspecified site   . Disorder of bone and cartilage, unspecified   . Depressive disorder, not elsewhere classified   . Venous insufficiency   . Renal insufficiency    Past Surgical History:  Past Surgical History  Procedure Date  . Tonsillectomy     PT Assessment/Plan/Recommendation PT Assessment Clinical Impression Statement: Pt admitted with A-fib and SOB. Pt was much steadier walking in hallway with RW, talked to pt about using the RW when first going home and pt agrees. Pt walked without supplement O2 and stats stayed above 94 the whole walk and the pt did not experience SOB. Pt states that she doesnt feel at baseline and that she just feels weak. PT recommends HHPT upon D/C for follow up therapy.  PT will also add pt to mobility team, see shadow chart for those notes.  PT Recommendation/Assessment: Patient will need skilled PT in the acute care venue PT Problem List: Decreased strength;Decreased range of motion;Decreased activity  tolerance;Decreased balance;Decreased mobility;Decreased coordination;Decreased knowledge of use of DME;Decreased safety awareness;Decreased knowledge of precautions PT Therapy Diagnosis : Difficulty walking;Abnormality of gait;Generalized weakness PT Plan PT Frequency: Min 3X/week PT Treatment/Interventions: DME instruction;Gait training;Stair training;Functional mobility training;Therapeutic activities;Therapeutic exercise;Balance training;Patient/family education PT Recommendation Follow Up Recommendations: Home health PT Equipment Recommended: None recommended by PT (pt has RW at home) PT Goals  Acute Rehab PT Goals PT Goal Formulation: With patient Time For Goal Achievement: 7 days Pt will go Supine/Side to Sit: Independently;with HOB 0 degrees PT Goal: Supine/Side to Sit - Progress: Goal set today Pt will go Sit to Supine/Side: Independently;with HOB 0 degrees PT Goal: Sit to Supine/Side - Progress: Goal set today Pt will go Sit to Stand: Independently PT Goal: Sit to Stand - Progress: Goal set today Pt will go Stand to Sit: Independently PT Goal: Stand to Sit - Progress: Goal set today Pt will Transfer Bed to Chair/Chair to Bed: Independently PT Transfer Goal: Bed to Chair/Chair to Bed - Progress: Goal set today Pt will Ambulate: >150 feet;with least restrictive assistive device;with modified independence PT Goal: Ambulate - Progress: Goal set today Pt will Perform Home Exercise Program: Independently PT Goal: Perform Home Exercise Program - Progress: Goal set today  PT Evaluation Precautions/Restrictions  Precautions Precautions: Fall Required Braces or Orthoses: No Restrictions Weight Bearing Restrictions: No Prior Functioning  Home Living Lives With: Spouse Receives Help From: Family Type of Home: House Home Layout: One level Home Access: Level entry Bathroom Shower/Tub: Chiropodist: Handicapped height Bathroom Accessibility: Yes Home Adaptive  Equipment: None;Walker - rolling;Straight cane;Grab bars in shower Prior Function Level of Independence: Independent with basic ADLs;Independent with transfers;Independent with gait (husbands helps her take a bath) Driving: No Vocation: Retired Artist: Awake/alert Overall Cognitive Status: Appears within functional limits for tasks assessed Sensation/Coordination Sensation Light Touch: Appears Intact Stereognosis: Not tested Hot/Cold: Not tested Proprioception: Not tested Coordination Gross Motor Movements are Fluid and Coordinated: Yes Fine Motor Movements are Fluid and Coordinated: Yes Extremity Assessment RUE Assessment RUE Assessment: Within Functional Limits LUE Assessment LUE Assessment: Within Functional Limits RLE Assessment RLE Assessment: Within Functional Limits LLE Assessment LLE Assessment: Within Functional Limits Mobility (including Balance) Bed Mobility Bed Mobility: Yes Supine to Sit: 4: Min assist Supine to Sit Details (indicate cue type and reason): pt needing cueing on progression and help getting COM over BOS at end of exercise Sitting - Scoot to Edge of Bed: 4: Min assist Sitting - Scoot to [employer] of Bed Details (indicate cue type and reason): pt needing assistance weight shifting and scooting forward, needing cueing on progression of exercise Sit to Supine: Not Tested (comment) Transfers Transfers: Yes Sit to Stand: 5: Supervision;From bed;From toilet Sit to Stand Details (indicate cue type and reason): pt needing cueing on pushing up from bed and for progression of exercise Stand to Sit: 5: Supervision;To chair/3-in-1;To toilet Stand to Sit Details: pt needing cueing to reach back for chair before sitting Ambulation/Gait Ambulation/Gait: Yes Ambulation/Gait Assistance: 4: Min assist Ambulation/Gait Assistance Details (indicate cue type and reason): pt walked from bed to bathroom (20 ft) without and assistive device and  was a little off balance, walked in hallway for 50 feet with RW and pt much steadier Ambulation Distance (Feet): 70 Feet Assistive device: Rolling walker Gait Pattern: Decreased stride length;Decreased stance time - left;Trunk flexed Gait velocity: very slow Stairs: No Wheelchair Mobility Wheelchair Mobility: No  Posture/Postural Control Posture/Postural Control: No significant limitations Balance Balance Assessed: Yes Dynamic Sitting Balance Dynamic Sitting - Balance Support: Right upper extremity supported;Feet supported Dynamic Sitting - Level of Assistance: 5: Stand by assistance Dynamic Sitting - Comments: pt able to sit and perform strength testing and reaching outside BOS with one hand supported on bed and feet supported on floor with stand by assistance Dynamic Standing Balance Dynamic Standing - Balance Support: Left upper extremity supported Dynamic Standing - Level of Assistance: 5: Stand by assistance Dynamic Standing - Comments: pt able to stand at sink and reach for soap outside of BOS and then wash hands with stand by assist End of Session PT - End of Session Equipment Utilized During Treatment: Gait belt Activity Tolerance: Patient tolerated treatment well Patient left: in chair;with call bell in reach Nurse Communication: Mobility status for transfers;Mobility status for ambulation General Behavior During Session: [facility] for tasks performed Cognition: [facility] for tasks performed  Dulce Sellar 11/20/2011, 10:22 AM

## 2011-11-21 ENCOUNTER — Encounter (HOSPITAL_COMMUNITY): Payer: Self-pay | Admitting: Physician Assistant

## 2011-11-21 ENCOUNTER — Other Ambulatory Visit: Payer: Self-pay | Admitting: *Deleted

## 2011-11-21 DIAGNOSIS — N179 Acute kidney failure, unspecified: Secondary | ICD-10-CM

## 2011-11-21 DIAGNOSIS — J9691 Respiratory failure, unspecified with hypoxia: Secondary | ICD-10-CM

## 2011-11-21 LAB — CBC
MCH: 30.5 pg (ref 26.0–34.0)
MCHC: 31.8 g/dL (ref 30.0–36.0)
MCV: 96.2 fL (ref 78.0–100.0)
Platelets: 223 10*3/uL (ref 150–400)
RDW: 13.2 % (ref 11.5–15.5)

## 2011-11-21 LAB — BASIC METABOLIC PANEL
CO2: 31 mEq/L (ref 19–32)
Calcium: 9.8 mg/dL (ref 8.4–10.5)
Creatinine, Ser: 1.7 mg/dL — ABNORMAL HIGH (ref 0.50–1.10)
Glucose, Bld: 107 mg/dL — ABNORMAL HIGH (ref 70–99)

## 2011-11-21 MED ORDER — FUROSEMIDE 40 MG PO TABS: 40.0000 mg | ORAL_TABLET | Freq: Two times a day (BID) | ORAL | Status: DC

## 2011-11-21 MED ORDER — OLANZAPINE 2.5 MG PO TABS: 1.2500 mg | ORAL_TABLET | Freq: Every day | ORAL | Status: DC

## 2011-11-21 MED ORDER — FUROSEMIDE 20 MG PO TABS: ORAL_TABLET | ORAL | Status: DC

## 2011-11-21 MED ORDER — POTASSIUM CHLORIDE CRYS ER 20 MEQ PO TBCR: 20.0000 meq | EXTENDED_RELEASE_TABLET | Freq: Two times a day (BID) | ORAL | Status: DC

## 2011-11-21 NOTE — Discharge Summary (Signed)
Discharge Summary   Patient ID: Sonya Kaufman MRN: [id number], DOB/AGE: 1930-11-10 78 y.o. Admit date: 11/17/2011 D/C date:     11/21/2011   Primary Discharge Diagnoses:  1. Acute on chronic diastolic CHF - echo was technically difficult but EF was 60-65% 11/18/11 2. Acute hypoxic respiratory failure secondary to CHF exacerbation 3. Acute on chronic renal insufficiency 4. Sinus bradycardia, asymptomatic  Secondary Discharge Diagnoses:  1. Atrial fib 2. Atrial flutter 3. Hypothyroidism 4. Hyperlipidemia 5. Overweight 6. Hx of diverticulosis 7. Hx of benign neoplasm of colon 8. Osteoarthritis 9. Depressive disorder 10. Venous insufficiency  Hospital Course: 78 y/o F with hx of afib/flutter, diastolic CHF presented to [facility] with complaints of SOB and was found to be hypoxic with O2 sats 75% on RA. She has had a forthy cough the past 2 days and overnight had progressive dyspnea, abdominal swelling to the point where her daughter (who is a Marine scientist at [employer]) took her to the ER. These symptoms were consistent with acute on chronic diastolic CHF. She did not have CP but did have a brief episode of indigestion yesterday followed by vomiting. Has occasional dizzy spells,but  no syncope, fever or chills. HR was upper 40s-60s arrival to ER with elevated BP. CT angio ruled out PE but did show CHF. She was started on IV diuresis and her renal function was followed carefully. Her metoprolol was decreased due to bradycardia but was later increased back to home dose due to occurrence of atrial fib. 2D echo was obtained with results below, EF 60-65%. Oxygen was able to be weaned. By 2/20, she was able to be changed to po Lasix. By 11/21/11 she had diuresed 6 lbs. Her Cr bumped slightly today, but she is feeling well overall with better volume status. Dr. Angelena Form has seen and examined her and feels she is stable for discharge today. Per verbal discussion with him, we will hold her Lasix today and resume on Saturday  11/23/11. She will have a repeat BMET on Monday with close f/u in the office.   Discharge Vitals: Blood pressure 133/60, pulse 56, temperature 97.5 F (36.4 C), temperature source Oral, resp. rate 18, height 5\' 8"  (1.727 m), weight 198 lb 3.1 oz (89.9 kg), SpO2 98.00%.  Labs: Lab Results  Component Value Date   WBC 7.2 11/21/2011   HGB 12.8 11/21/2011   HCT 40.3 11/21/2011   MCV 96.2 11/21/2011   PLT 223 11/21/2011     Lab 11/21/11 0720 11/17/11 1145  NA 138 --  K 4.3 --  CL 101 --  CO2 31 --  BUN 27* --  CREATININE 1.70* --  CALCIUM 9.8 --  PROT -- 6.3  BILITOT -- 0.5  ALKPHOS -- 88  ALT -- 106*  AST -- 67*  GLUCOSE 107* --  Of note, the patient has a hx of mildly elevated LFT's but these have remained relatively stable over the last year.   Diagnostic Studies/Procedures   1. 2D Echo 11/18/11 Study Conclusions - Left ventricle: Technically very difficult study- interpretation limited The cavity size was normal. Wall thickness was normal. Systolic function was normal. The estimated ejection fraction was in the range of 60% to 65%. Regional wall motion abnormalities cannot be excluded. - Pericardium, extracardiac: There was no pericardial effusion.  2. Ct Angio Chest W/cm &/or Wo Cm 11/17/2011  *RADIOLOGY REPORT*  Clinical Data: Shortness of breath and wheezing  CT ANGIOGRAPHY CHEST  Technique:  Multidetector CT imaging of the chest using the standard protocol  during bolus administration of intravenous contrast. Multiplanar reconstructed images including MIPs were obtained and reviewed to evaluate the vascular anatomy.  Contrast: 66mL OMNIPAQUE IOHEXOL 300 MG/ML IV SOLN  Comparison: Chest radiograph 11/17/2011  Findings: This is a technically satisfactory evaluation of the pulmonary arteries.  No focal filling defects are identified in the pulmonary arterial tree.  Central pulmonary arteries are normal in size.  There is moderate cardiomegaly.  Several mediastinal lymph nodes are present,  but not pathologically enlarged. There is nonpathologically enlarged hilar nodal tissue. No enlarged lymph nodes. The thoracic aorta is normal in caliber and contains scattered areas of atherosclerotic calcification.  No significant contrast is seen within the aorta at the time of the examination.  There is a very small right, and a trace left pleural effusion. Negative for pericardial effusion.  Slight intralobular septal thickening in the lung apices, and more prominent septal thickening at the lung bases.  There are scattered ground-glass opacities bilaterally.  Findings are consistent with mild pulmonary edema.  There is no consolidation.  No acute bony abnormality.  IMPRESSION:  1. CHF.  Cardiomegaly, mild pulmonary edema, and very small right and trace left pleural effusions. 2.  Negative for pulmonary embolism.  Original Report Authenticated By: Curlene Dolphin, M.D.   3. Chest Port 1 View 11/17/2011  *RADIOLOGY REPORT*  Clinical Data: Shortness of breath; atrial fibrillation.  PORTABLE CHEST - 1 VIEW  Comparison: Chest radiograph performed 12/03/2010  Findings: The lungs are well-aerated.  Mild bibasilar airspace opacities raise question for minimal interstitial edema, given underlying vascular congestion.  Trace fluid or atelectasis is noted along the right minor fissure.  There is no evidence of pneumothorax.  The cardiomediastinal silhouette is mildly enlarged.  No acute osseous abnormalities are seen.  IMPRESSION: Mild bibasilar airspace opacities raise question for minimal interstitial edema, given underlying vascular congestion and mild cardiomegaly.  Trace fluid or atelectasis along the right minor fissure.  Original Report Authenticated By: Santa Lighter, M.D.    Discharge Medications   Medication List  As of 11/21/2011 10:01 AM   TAKE these medications         buPROPion 300 MG 24 hr tablet   Commonly known as: WELLBUTRIN XL   Take 300 mg by mouth daily.      dabigatran 75 MG Caps    Commonly known as: PRADAXA   Take 1 capsule (75 mg total) by mouth every 12 (twelve) hours.      escitalopram 20 MG tablet   Commonly known as: LEXAPRO   Take 20 mg by mouth daily.      furosemide 20 MG tablet   Commonly known as: LASIX   IMPORTANT: Do not restart until Saturday 11/23/11!  On Saturday February 23rd, please begin taking 2 tablets in the morning and 2 tablets in the evening (by mouth).      levothyroxine 112 MCG tablet   Commonly known as: SYNTHROID, LEVOTHROID   Take 112 mcg by mouth daily.      lithium carbonate 300 MG CR tablet   Commonly known as: LITHOBID   Take 750 mg by mouth daily. 2 1/2  tablets daily.      metoprolol succinate 50 MG 24 hr tablet   Commonly known as: TOPROL-XL   Take 1 tablet (50 mg total) by mouth 2 (two) times daily.      OLANZapine 2.5 MG tablet   Commonly known as: ZYPREXA   Take 0.5 tablets (1.25 mg total) by mouth at bedtime. Please check  the dose you were taking at home. We would like you to continue the dose you were taking prior to admission to the hospital. You were taking 1.25mg  daily while in the hospital. If your home dose is different than a half tablet daily, please contact our office to update our records.      polyethylene glycol packet   Commonly known as: MIRALAX / GLYCOLAX   Take 17 g by mouth 2 (two) times daily.      potassium chloride SA 20 MEQ tablet   Commonly known as: K-DUR,KLOR-CON   Take 1 tablet (20 mEq total) by mouth 2 (two) times daily.      PROAIR HFA 108 (90 BASE) MCG/ACT inhaler   Generic drug: albuterol   Inhale 2 puffs into the lungs every 6 (six) hours as needed.      simvastatin 20 MG tablet   Commonly known as: ZOCOR   Take 1 tablet (20 mg total) by mouth at bedtime.            Disposition   The patient will be discharged in stable condition to home. Discharge Orders    Future Appointments: Provider: Department: Dept Phone: Center:   11/25/2011 9:30 AM [facility] [employer] [id number] [facility]   11/28/2011 11:30 AM Liliane Shi, [place] [phone] [facility]   02/14/2012 11:00 AM Noralee Space, MD [facility] [phone] None     Future Orders Please Complete By Expires   Diet - low sodium heart healthy      Increase activity slowly        Follow-up Information    Follow up with [facility]. (Labwork only on Monday 11/25/11 - can come in anytime between 8:30am-4pm)    Contact information:   [place] [phone]       Follow up with Richardson Dopp, PA. (11/28/11 at 11:30am)    Contact information:   [address] [place] [phone]            Duration of Discharge Encounter: Greater than 30 minutes including physician and PA time.  Signed, Xian Alves PA-C 11/21/2011, 10:01 AM

## 2011-11-21 NOTE — Progress Notes (Addendum)
    SUBJECTIVE: Feels much better. NO SOB, chest pain.   BP 133/60  Pulse 56  Temp(Src) 97.5 F (36.4 C) (Oral)  Resp 18  Ht 5\' 8"  (1.727 m)  Wt 198 lb 3.1 oz (89.9 kg)  BMI 30.14 kg/m2  SpO2 98%  Intake/Output Summary (Last 24 hours) at 11/21/11 0701 Last data filed at 11/21/11 0447  Gross per 24 hour  Intake   1093 ml  Output    551 ml  Net    542 ml    PHYSICAL EXAM General: Well developed, well nourished, in no acute distress. Alert and oriented x 3.  Psych:  Good affect, responds appropriately Neck: No JVD. No masses noted.  Lungs: Clear bilaterally with no wheezes or rhonci noted.  Heart: RRR with no murmurs noted. Abdomen: Bowel sounds are present. Soft, non-tender.  Extremities: No lower extremity edema.   LABS: Basic Metabolic Panel: Pending this am.   Current Meds:    . buPROPion  300 mg Oral Daily  . dabigatran  75 mg Oral Q12H  . escitalopram  20 mg Oral Daily  . furosemide  40 mg Oral BID  . levothyroxine  112 mcg Oral Daily  . lithium carbonate  750 mg Oral Daily  . metoprolol succinate  50 mg Oral BID  . OLANZapine  1.25 mg Oral QHS  . potassium chloride SA  20 mEq Oral BID  . potassium chloride  40 mEq Oral Once  . simvastatin  20 mg Oral QHS  . sodium chloride  3 mL Intravenous Q12H  . DISCONTD: furosemide  40 mg Intravenous Q8H     ASSESSMENT AND PLAN:  1. Atrial fibrillation,paroxysmal:  Maintaining NSR with some bradycardia but asymptomatic. Continue Pradaxa for anticoagulation and Toprol for rate control.   2. Acute respiratory insufficiency secondary to acute on chronic diastolic CHF: Volume status is much better. Will discharge home on Lasix 40 mg po BID (this is an increase from her home dosage which was 40mg  am/20mg  pm).   3. Chronic renal insufficiency: Stable with diuresis. F/U BMET this am before discharge home.   4. Hypokalemia: Recheck this am.     Jamarrion Budai  2/21/20137:01 AM   D/c home today if renal  function is stable. She will need f/u with Dr. Rayann Heman in 2-3 weeks.   Iviana Blasingame 7:42 AM 11/21/2011

## 2011-11-21 NOTE — Discharge Summary (Signed)
See full note written this am. cdm

## 2011-11-25 ENCOUNTER — Other Ambulatory Visit (INDEPENDENT_AMBULATORY_CARE_PROVIDER_SITE_OTHER): Payer: Medicare Other

## 2011-11-25 DIAGNOSIS — N179 Acute kidney failure, unspecified: Secondary | ICD-10-CM

## 2011-11-25 LAB — BASIC METABOLIC PANEL
CO2: 28 mEq/L (ref 19–32)
Calcium: 9.9 mg/dL (ref 8.4–10.5)
Creatinine, Ser: 1.9 mg/dL — ABNORMAL HIGH (ref 0.4–1.2)

## 2011-11-28 ENCOUNTER — Encounter: Payer: Medicare Other | Admitting: Physician Assistant

## 2011-11-29 ENCOUNTER — Ambulatory Visit (INDEPENDENT_AMBULATORY_CARE_PROVIDER_SITE_OTHER): Payer: Medicare Other | Admitting: Physician Assistant

## 2011-11-29 ENCOUNTER — Encounter: Payer: Self-pay | Admitting: Physician Assistant

## 2011-11-29 VITALS — BP 120/68 | HR 38 | Ht 68.0 in | Wt 200.0 lb

## 2011-11-29 DIAGNOSIS — R001 Bradycardia, unspecified: Secondary | ICD-10-CM | POA: Insufficient documentation

## 2011-11-29 DIAGNOSIS — I5032 Chronic diastolic (congestive) heart failure: Secondary | ICD-10-CM

## 2011-11-29 DIAGNOSIS — N289 Disorder of kidney and ureter, unspecified: Secondary | ICD-10-CM | POA: Insufficient documentation

## 2011-11-29 DIAGNOSIS — F329 Major depressive disorder, single episode, unspecified: Secondary | ICD-10-CM

## 2011-11-29 DIAGNOSIS — I4892 Unspecified atrial flutter: Secondary | ICD-10-CM

## 2011-11-29 DIAGNOSIS — I509 Heart failure, unspecified: Secondary | ICD-10-CM

## 2011-11-29 DIAGNOSIS — I4891 Unspecified atrial fibrillation: Secondary | ICD-10-CM

## 2011-11-29 DIAGNOSIS — I498 Other specified cardiac arrhythmias: Secondary | ICD-10-CM

## 2011-11-29 LAB — BASIC METABOLIC PANEL
Calcium: 9.7 mg/dL (ref 8.4–10.5)
GFR: 28.49 mL/min — ABNORMAL LOW (ref >60.00)
Glucose, Bld: 83 mg/dL (ref 70–99)
Sodium: 139 mEq/L (ref 135–145)

## 2011-11-29 NOTE — Progress Notes (Signed)
[place], [place]  [address] Phone: [phone] Fax:  [phone]  Date:  11/29/2011   Name:  Sonya Kaufman       DOB:  06-19-1931 MRN:  [id number]  PCP:  Dr. Lenna Gilford Primary Electrophysiologist:  Dr. Thompson Grayer    History of Present Illness: Sonya Kaufman is a 78 y.o. female who presents for post hospital follow up.  She has a history of atrial fibrillation/flutter, diastolic heart failure, chronic kidney disease, hypothyroidism, hyperlipidemia and depression.  She was admitted 2/17-2/21 with acute on chronic diastolic heart failure.  She was hypoxic upon admission with a saturation of 75% on room air.  He chest CT was done and demonstrated no evidence of pulmonary embolism.  Echocardiogram 11/18/11: Technically difficult study, EF 60-65%, regional wall motion abnormalities could not be excluded.  Beta blocker was adjusted initially for bradycardia but resumed at home dose at discharge due to recurrence of atrial fibrillation.  Her creatinine did increase slightly.  Labs: Hemoglobin 12.8, potassium 4.3, creatinine 1.7, A1c 5.6, TSH 1.161, AST 67, ALT 106.  It was noted that she has a history of mildly elevated LFTs.  Followup labs 11/25/11: Potassium 3.9, BUN 26, creatinine 1.9.  Breathing is stable.  She feels back to baseline.  However, she has no energy.  Her depression may be getting worse.  She sees her psychiatrist next week.  She denies syncope or near-syncope.  She denies chest discomfort.  She denies orthopnea, PND or edema.  She denies significant edema.  Her weights have been stable since discharged home.  She has a slight cough in the mornings.  She denies hemoptysis or purulent sputum.  She did have some nausea and vomiting yesterday.  Past Medical History  Diagnosis Date  . Paroxysmal atrial fibrillation     Sinus bradycardia during admit 11/2011  . Atrial flutter   . Diastolic CHF, chronic   . Other and unspecified hyperlipidemia     EF  60-65% by echo 11/2011  . Unspecified hypothyroidism   . Overweight   . Diverticulosis of colon (without mention of hemorrhage)   . Benign neoplasm of colon   . Osteoarthrosis, unspecified whether generalized or localized, unspecified site   . Disorder of bone and cartilage, unspecified   . Depressive disorder, not elsewhere classified   . Venous insufficiency   . Renal insufficiency     Current Outpatient Prescriptions  Medication Sig Dispense Refill  . albuterol (PROAIR HFA) 108 (90 BASE) MCG/ACT inhaler Inhale 2 puffs into the lungs every 6 (six) hours as needed.        Marland Kitchen buPROPion (WELLBUTRIN XL) 300 MG 24 hr tablet Take 300 mg by mouth daily.        . dabigatran (PRADAXA) 75 MG CAPS Take 1 capsule (75 mg total) by mouth every 12 (twelve) hours.  60 capsule  11  . escitalopram (LEXAPRO) 20 MG tablet Take 20 mg by mouth daily.      . furosemide (LASIX) 20 MG tablet IMPORTANT: Do not restart until Saturday 11/23/11! On Saturday February 23rd, please begin taking 2 tablets in the morning and 2 tablets in the evening (by mouth).      Marland Kitchen levothyroxine (SYNTHROID, LEVOTHROID) 112 MCG tablet Take 112 mcg by mouth daily.        Marland Kitchen lithium (LITHOBID) 300 MG CR tablet Take 750 mg by mouth daily. 2 1/2  tablets daily.      . metoprolol (TOPROL-XL) 50 MG 24 hr  tablet Take 1 tablet (50 mg total) by mouth 2 (two) times daily.  60 tablet  12  . OLANZapine (ZYPREXA) 2.5 MG tablet Take 0.5 tablets (1.25 mg total) by mouth at bedtime. Please check the dose you were taking at home. We would like you to continue the dose you were taking prior to admission to the hospital. You were taking 1.25mg  daily while in the hospital. If your home dose is different than a half tablet daily, please contact our office to update our records.      . polyethylene glycol (MIRALAX / GLYCOLAX) packet Take 17 g by mouth 2 (two) times daily.        . potassium chloride SA (K-DUR,KLOR-CON) 20 MEQ tablet Take 1 tablet (20 mEq total)  by mouth 2 (two) times daily.      . simvastatin (ZOCOR) 20 MG tablet Take 1 tablet (20 mg total) by mouth at bedtime.  30 tablet  6    Allergies: No Known Allergies  History  Substance Use Topics  . Smoking status: Never Smoker   . Smokeless tobacco: Never Used  . Alcohol Use: No     ROS:  Please see the history of present illness.    All other systems reviewed and negative.   PHYSICAL EXAM: VS:  BP 120/68  Pulse 38  Ht 5\' 8"  (1.727 m)  Wt 200 lb (90.719 kg)  BMI 30.41 kg/m2 Well nourished, well developed, in no acute distress HEENT: normal Neck: no JVD At 90 Cardiac:  normal S1, S2; Slow RRR; no murmur Lungs:  Bibasilar crackles with partial clearing with cough, no wheezing Abd: soft, nontender, no hepatomegaly Ext: no edema Skin: warm and dry Neuro:  CNs 2-12 intact, no focal abnormalities noted  EKG:  Sinus bradycardia, heart rate 38, normal axis, nonspecific ST-T wave changes  ASSESSMENT AND PLAN:  1. Chronic diastolic heart failure  I believe that her volume is stable.  She seems to be somewhat lethargic.  This may be from depression.  She may also be symptomatic from bradycardia.  She has some crackles on lung exam.  Question if this is atelectasis.  Check a basic metabolic panel today.  I will also repeat a BNP.  If her BNP is increased we can certainly increase her Lasix.   2. Bradycardia  Decrease Toprol-XL to 50 mg once daily.  Repeat an EKG with the nurse in several days.  I can see her back on a day that Dr. Rayann Heman is in the office in 2 weeks.  Recent TSH normal.   3. Atrial fibrillation  She remains on Pradaxa at renal dosing.  She is currently maintaining sinus rhythm.  She likely has tachybradycardia syndrome.  Discussed with Dr. Thompson Grayer.  He would like to avoid pacemaker if at all possible.     4. Renal insufficiency  Repeat basic metabolic panel today.   5. DEPRESSION  Given her bradycardia, check a lithium level.  We will send this to her  psychiatrist.        [name], PA-C  11:47 AM 11/29/2011

## 2011-11-29 NOTE — Patient Instructions (Addendum)
Your physician recommends that you schedule a follow-up appointment in: 2 WEEKS WITH DR. ALLRED;  IF NOT AVAILABLE THEN SCHEDULE WITH SCOTT WEAVER, PA-C SAME DAY DR. Rayann Heman IS IN THE OFFICE.  Your physician recommends that you return for lab work in: TODAY BMET, BNP, LITHIUM LEVEL 311, 428.32, 427.31,   PLEASE MAKE NURSE VISIT APPT FOR EKG TO BE DONE ON Monday 12/02/11  Your physician has recommended you make the following change in your medication: DECREASE TOPROL TO 50 MG DAILY

## 2011-11-30 ENCOUNTER — Telehealth: Payer: Self-pay | Admitting: Physician Assistant

## 2011-11-30 LAB — LITHIUM LEVEL: Lithium Lvl: 1.64 mEq/L — ABNORMAL HIGH (ref 0.80–1.40)

## 2011-11-30 NOTE — Telephone Encounter (Signed)
Contacted by [facility] lab this AM re: elevated Lithium level of 1.64 (0.8 - 1.4). Lab was drawn around 12:00 NOON yest. I relayed this to the pt's son, who reported that the pt does not take this med until suppertime. I advised him to have her hold tomorrow's dose, and to await further instructions at time of f/u in our office on Mon, 3/4. Pt also has appt with her psychiatrist on Tues, 3/5.

## 2011-12-02 ENCOUNTER — Telehealth: Payer: Self-pay | Admitting: *Deleted

## 2011-12-02 ENCOUNTER — Ambulatory Visit (INDEPENDENT_AMBULATORY_CARE_PROVIDER_SITE_OTHER): Payer: Medicare Other | Admitting: *Deleted

## 2011-12-02 VITALS — BP 130/60 | HR 37 | Ht 68.0 in | Wt 202.0 lb

## 2011-12-02 DIAGNOSIS — I498 Other specified cardiac arrhythmias: Secondary | ICD-10-CM

## 2011-12-02 DIAGNOSIS — R001 Bradycardia, unspecified: Secondary | ICD-10-CM

## 2011-12-02 NOTE — Progress Notes (Signed)
Agree Richardson Dopp, PA-C  5:05 PM 12/02/2011

## 2011-12-02 NOTE — Progress Notes (Signed)
Patient in for an EKG,  Pt. had an office visit 11/29/11 with Richardson Dopp PA. Pt's EKG done per RN and read by PA. Sinus Bradycardia at 37 beats/minute. B/P left arm taken manually 130/60. Richardson Dopp PA recommended for pt to decrease Toprol XL to 25 mg once a day, and to have and repeat EKG in a week . Daughter states patient has an office visit appointment with Nicki Reaper weaver next week on 12/11/11, EKG will be done then. Instructions given to Patient daughter.

## 2011-12-02 NOTE — Telephone Encounter (Signed)
s/w pt's husband who advised for me to call daughter [name], RN [phone], he states that she does all of pt's medications, lm tcb [phone] [facility] office. Julaine Hua

## 2011-12-02 NOTE — Telephone Encounter (Signed)
Message copied by Michae Kava on Mon Dec 02, 2011 11:19 AM ------      Message from: [place], [place] T      Created: Sun Dec 01, 2011  8:36 PM       Decrease Lithium to 600 mg daily (current dose is 750 mg)      Confirm current dose and let me know if she is on something different      Fax to her Psychiatrist      Further adjustments need to be made by her psychiatrist      Richardson Dopp, PA-C  8:36 PM 12/01/2011

## 2011-12-03 ENCOUNTER — Telehealth: Payer: Self-pay | Admitting: Internal Medicine

## 2011-12-03 ENCOUNTER — Telehealth: Payer: Self-pay | Admitting: Cardiology

## 2011-12-03 NOTE — Telephone Encounter (Signed)
New Msg: Calling wanting to speak with nurse/md regarding pt falling down/possibly blacking out and  needing a pacemaker. Please return pt daughter call to discuss further.

## 2011-12-03 NOTE — Telephone Encounter (Signed)
Patient had a spell yesterday where she "blacked" out for a few seconds and feel per the daughter.  She was seen yesterday and her HR was 37.  Scott decrease her Toprol to 25mg  daily.  I have asked her to take this at night.  Her HR is 64 today, I have asked her husband and daughter to keep a log of her HR's and let me know if she has any more of these spells after decreasing the Toprol

## 2011-12-03 NOTE — Telephone Encounter (Signed)
Cannot Get a hold of Pt's Daughter [name], to let her know FMLA is ready for  Pick Up, I contacted, I contacted [place], she was going to [employer] and Leisure centre manager ( she works for [employer]) and let her Know  To contact me.. 12/03/11/KM

## 2011-12-03 NOTE — Telephone Encounter (Signed)
Spoke with patient's daughter.  Her Psychiatrist has stopped her Lithium and is going to restart it today or tomorrow

## 2011-12-04 ENCOUNTER — Other Ambulatory Visit: Payer: Self-pay | Admitting: Pulmonary Disease

## 2011-12-04 ENCOUNTER — Telehealth: Payer: Self-pay | Admitting: Internal Medicine

## 2011-12-04 NOTE — Telephone Encounter (Addendum)
Robin Called Back she Will Pick Up FMLA Friday, Made Her aware FMLA was Faxed To Zebedee Iba @ [phone]  12/04/11/KM

## 2011-12-04 NOTE — Telephone Encounter (Signed)
New msg Pt's daughter calling back about fmla paperwork

## 2011-12-04 NOTE — Telephone Encounter (Signed)
Did not hear back From [name], [place] and Spoke with Zebedee Iba " Leave Of Absence Specialist" for  [facility], she asked for the FMLA to be faxed to her @  [phone] ( it was faxed) she will aslo try and Contact her to let her know FMLA Ready  For Pick Up. 12/04/11/KM

## 2011-12-04 NOTE — Telephone Encounter (Signed)
Have Left Several Message for Sonya Kaufman on Phone, No Return Call. Left her a Message @ 2:48 today on her Work phone for her to Return my call FMLA papers Signed and ready For Pick Up  12/04/11/KM

## 2011-12-05 ENCOUNTER — Telehealth: Payer: Self-pay | Admitting: Internal Medicine

## 2011-12-05 NOTE — Telephone Encounter (Signed)
Discussed with Dr Rayann Heman  Pt can take 37.5mg  of Toprol daily  Daughter aware and will follow up on Wed

## 2011-12-05 NOTE — Telephone Encounter (Signed)
Pt's heat rate was low and appt this week, toprol was lowered and now heart rate 106, then 110, last night,  then today 99

## 2011-12-10 NOTE — Progress Notes (Signed)
Addended by: Doug Sou D on: 12/10/2011 04:16 PM   Modules accepted: Orders

## 2011-12-11 ENCOUNTER — Encounter: Payer: Self-pay | Admitting: Physician Assistant

## 2011-12-11 ENCOUNTER — Ambulatory Visit (INDEPENDENT_AMBULATORY_CARE_PROVIDER_SITE_OTHER): Payer: Medicare Other | Admitting: Physician Assistant

## 2011-12-11 VITALS — BP 130/78 | HR 97 | Ht 68.0 in | Wt 199.0 lb

## 2011-12-11 DIAGNOSIS — I4891 Unspecified atrial fibrillation: Secondary | ICD-10-CM

## 2011-12-11 DIAGNOSIS — F329 Major depressive disorder, single episode, unspecified: Secondary | ICD-10-CM

## 2011-12-11 DIAGNOSIS — I5032 Chronic diastolic (congestive) heart failure: Secondary | ICD-10-CM

## 2011-12-11 DIAGNOSIS — I498 Other specified cardiac arrhythmias: Secondary | ICD-10-CM

## 2011-12-11 DIAGNOSIS — R001 Bradycardia, unspecified: Secondary | ICD-10-CM

## 2011-12-11 DIAGNOSIS — I509 Heart failure, unspecified: Secondary | ICD-10-CM

## 2011-12-11 DIAGNOSIS — N289 Disorder of kidney and ureter, unspecified: Secondary | ICD-10-CM

## 2011-12-11 LAB — BASIC METABOLIC PANEL
Calcium: 9.5 mg/dL (ref 8.4–10.5)
GFR: 35.89 mL/min — ABNORMAL LOW (ref >60.00)
Glucose, Bld: 102 mg/dL — ABNORMAL HIGH (ref 70–99)
Sodium: 139 mEq/L (ref 135–145)

## 2011-12-11 MED ORDER — METOPROLOL SUCCINATE ER 25 MG PO TB24: 25.0000 mg | ORAL_TABLET | Freq: Two times a day (BID) | ORAL | Status: DC

## 2011-12-11 NOTE — Progress Notes (Signed)
[address] [place], [place]  [address] Phone: [phone] Fax:  [phone]  Date:  12/11/2011   Name:  Sonya Kaufman       DOB:  1931-02-04 MRN:  [id number]  PCP:  Dr. Lenna Gilford Primary Electrophysiologist:  Dr. Thompson Grayer    History of Present Illness: [place] is a 78 y.o. female who presents for follow up.  She has a history of atrial fibrillation/flutter, diastolic heart failure, chronic kidney disease, hypothyroidism, hyperlipidemia and depression.  She was admitted 2/17-2/21 with acute on chronic diastolic heart failure.  Chest CT demonstrated no evidence of pulmonary embolism.  Echocardiogram 11/18/11: Technically difficult study, EF 60-65%, regional wall motion abnormalities could not be excluded.  Beta blocker was adjusted initially for bradycardia but resumed due to recurrence of atrial fibrillation.    I saw her 3/1 for follow up.  She was significantly bradycardic.  I decreased her Toprol.  Creatinine was 1.8.  BNP was 520 and felt to be stable.  No further adjustments were made in her Lasix.  Lithium level was elevated at 1.64.  Her psychiatrist has discontinued lithium.  She was seen in follow up last week with the nurse and her Toprol was decreased further to 25 mg a day due to heart rate still in the 30s.  She looks much better today.  Affect is more normal.  She seems less lethargic.  Denies chest pain, dyspnea, orthopnea, PND or edema.  Weights are going down.  Daughter who is with her today notes that her mood is much improved since Lithium stopped.  HRs have been higher at home.  She called in last week and Toprol increased to 37.5 mg QD.  She remains on Pradaxa.  Did get weak when HR in 30s and fell in shower.  She apparently bruised her coccyx.  No head injury.  Much more steady on her feet now.  Past Medical History  Diagnosis Date  . Paroxysmal atrial fibrillation     Sinus bradycardia during admit 11/2011  . Atrial flutter   .  Diastolic CHF, chronic   . Other and unspecified hyperlipidemia     EF 60-65% by echo 11/2011  . Unspecified hypothyroidism   . Overweight   . Diverticulosis of colon (without mention of hemorrhage)   . Benign neoplasm of colon   . Osteoarthrosis, unspecified whether generalized or localized, unspecified site   . Disorder of bone and cartilage, unspecified   . Depressive disorder, not elsewhere classified   . Venous insufficiency   . Renal insufficiency     Current Outpatient Prescriptions  Medication Sig Dispense Refill  . albuterol (PROAIR HFA) 108 (90 BASE) MCG/ACT inhaler Inhale 2 puffs into the lungs every 6 (six) hours as needed.        Marland Kitchen buPROPion (WELLBUTRIN XL) 300 MG 24 hr tablet Take 300 mg by mouth daily.        . dabigatran (PRADAXA) 75 MG CAPS Take 1 capsule (75 mg total) by mouth every 12 (twelve) hours.  60 capsule  11  . escitalopram (LEXAPRO) 20 MG tablet Take 20 mg by mouth daily.      . furosemide (LASIX) 20 MG tablet IMPORTANT: Do not restart until Saturday 11/23/11! On Saturday February 23rd, please begin taking 2 tablets in the morning and 2 tablets in the evening (by mouth).      Marland Kitchen levothyroxine (SYNTHROID, LEVOTHROID) 112 MCG tablet Take 112 mcg by mouth daily.        Marland Kitchen  lithium (LITHOBID) 300 MG CR tablet Take 750 mg by mouth daily. 2 1/2  tablets daily.      . metoprolol succinate (TOPROL-XL) 25 MG 24 hr tablet Take 1 1/2 by mouth daily  60 tablet  12  . OLANZapine (ZYPREXA) 2.5 MG tablet Take 0.5 tablets (1.25 mg total) by mouth at bedtime. Please check the dose you were taking at home. We would like you to continue the dose you were taking prior to admission to the hospital. You were taking 1.25mg  daily while in the hospital. If your home dose is different than a half tablet daily, please contact our office to update our records.      . polyethylene glycol (MIRALAX / GLYCOLAX) packet Take 17 g by mouth 2 (two) times daily.        . potassium chloride SA  (K-DUR,KLOR-CON) 20 MEQ tablet Take 1 tablet (20 mEq total) by mouth 2 (two) times daily.      . simvastatin (ZOCOR) 20 MG tablet TAKE ONE TABLET BY MOUTH AT BEDTIME  30 tablet  6    Allergies: No Known Allergies  History  Substance Use Topics  . Smoking status: Never Smoker   . Smokeless tobacco: Never Used  . Alcohol Use: No     ROS:  Please see the history of present illness.  All other systems reviewed and negative.   PHYSICAL EXAM: VS:  BP 130/78  Pulse 97  Ht 5\' 8"  (1.727 m)  Wt 199 lb (90.266 kg)  BMI 30.26 kg/m2 Well nourished, well developed, in no acute distress HEENT: normal Neck: no JVD At 90 Cardiac:  normal S1, S2; RRR; no murmur Lungs:  Clear to ausc bilat, no wheezing, no rales, no rhonchi Abd: soft, nontender  Ext: no edema Skin: warm and dry Neuro:  CNs 2-12 intact, no focal abnormalities noted Psych: smiles at times and affect fairly normal  EKG:  AFib, HR 97, normal axis, NSSTTW changes  ASSESSMENT AND PLAN:  1. Atrial fibrillation  Now back in AFib.  Rate fairly well controlled.  Record of BP, HR and weights at home reviewed.  She has HRs in the 50-60s as well as 90-100s.  So, I suspect she is going in and out of AFib.  I also believe her HR is better off of the Lithium.  Change Toprol XL to 25 mg BID for better rate control.  She remains on Pradaxa at renal dose for stroke prophylaxis.  Follow up with Dr. Thompson Grayer in 4-6 weeks.   2. Bradycardia  Improved now that she is back in AFib.  Gently titrate Toprol to avoid worsening bradycardia in SR.   3. Chronic diastolic heart failure  Volume stable.  Continue current Rx.  Check BMET today.   4. Depression  Check Li level today and send to psychiatrist.   5. Renal insufficiency  Repeat BMET today.     Signed,  Richardson Dopp, PA-C  11:18 AM 12/11/2011

## 2011-12-11 NOTE — Patient Instructions (Signed)
Your physician recommends that you schedule a follow-up appointment in: 4-6 [place] DR. ALLRED  Your physician has recommended you make the following change in your medication: INCREASE METOPROLOL SUCCINATE TO 25 MG 1 TABLET TWICE DAILY 12 HOURS APART  Your physician recommends that you return for lab work in: TODAY BMET, LITHIUM LEVEL DEPRESSION, 427.31, 428.32

## 2011-12-12 ENCOUNTER — Telehealth: Payer: Self-pay | Admitting: Internal Medicine

## 2011-12-12 NOTE — Telephone Encounter (Signed)
Reviewed labs done 3/13 with pt's daughter. Will fax results to Dr. Albertine . [phone]

## 2011-12-12 NOTE — Telephone Encounter (Signed)
New Msg: Pt daughter calling to find out pt recent lab results. Please return call to discuss further.

## 2011-12-23 ENCOUNTER — Telehealth: Payer: Self-pay | Admitting: Internal Medicine

## 2011-12-23 DIAGNOSIS — I4891 Unspecified atrial fibrillation: Secondary | ICD-10-CM

## 2011-12-23 MED ORDER — METOPROLOL SUCCINATE ER 25 MG PO TB24: ORAL_TABLET | ORAL | Status: DC

## 2011-12-23 NOTE — Telephone Encounter (Signed)
Discussed with Dr Rayann Heman will increase her dose of Metoprolol to 37.5mg  1 1/2 tablets twice daily  I have left a message for the daughter

## 2011-12-23 NOTE — Telephone Encounter (Signed)
New Msg: Pt daughter calling wanting to speak with nurse/MD regarding pt daughter wanting MD to increase pt toprol. Pt HR running from 111-127. Pt daughter wants toprol increased to stabilize pt HR. Please return pt daughter call to discuss further.

## 2011-12-23 NOTE — Telephone Encounter (Signed)
Spoke with the pt's daughter and now she is on Toprol XL 25mg  bid  These have been checked at all different times of the day and at rest yesterday at dinner table her HR 122

## 2012-01-10 ENCOUNTER — Other Ambulatory Visit: Payer: Self-pay | Admitting: Pulmonary Disease

## 2012-01-24 ENCOUNTER — Encounter: Payer: Self-pay | Admitting: Internal Medicine

## 2012-01-24 ENCOUNTER — Ambulatory Visit (INDEPENDENT_AMBULATORY_CARE_PROVIDER_SITE_OTHER): Payer: Medicare Other | Admitting: Internal Medicine

## 2012-01-24 VITALS — BP 122/59 | HR 51 | Ht 68.0 in | Wt 204.0 lb

## 2012-01-24 DIAGNOSIS — I5032 Chronic diastolic (congestive) heart failure: Secondary | ICD-10-CM

## 2012-01-24 DIAGNOSIS — F329 Major depressive disorder, single episode, unspecified: Secondary | ICD-10-CM

## 2012-01-24 DIAGNOSIS — I4891 Unspecified atrial fibrillation: Secondary | ICD-10-CM

## 2012-01-24 DIAGNOSIS — I509 Heart failure, unspecified: Secondary | ICD-10-CM

## 2012-01-24 DIAGNOSIS — I498 Other specified cardiac arrhythmias: Secondary | ICD-10-CM

## 2012-01-24 DIAGNOSIS — R001 Bradycardia, unspecified: Secondary | ICD-10-CM

## 2012-01-24 LAB — BASIC METABOLIC PANEL
Calcium: 9.3 mg/dL (ref 8.4–10.5)
Chloride: 103 mEq/L (ref 96–112)
Creatinine, Ser: 1.5 mg/dL — ABNORMAL HIGH (ref 0.4–1.2)
GFR: 36.44 mL/min — ABNORMAL LOW (ref >60.00)

## 2012-01-24 LAB — CBC WITH DIFFERENTIAL/PLATELET
Basophils Relative: 0.6 % (ref 0.0–3.0)
Eosinophils Relative: 1.9 % (ref 0.0–5.0)
Lymphocytes Relative: 38.1 % (ref 12.0–46.0)
MCV: 92.6 fl (ref 78.0–100.0)
Monocytes Relative: 10 % (ref 3.0–12.0)
Neutrophils Relative %: 49.4 % (ref 43.0–77.0)
RBC: 4.28 Mil/uL (ref 3.87–5.11)
WBC: 5.6 10*3/uL (ref 4.5–10.5)

## 2012-01-24 NOTE — Progress Notes (Signed)
PCP:  Noralee Space, MD, MD  The patient presents today for routine cardiology followup.  Since last being seen by Richardson Dopp, the patient reports doing very well.  Her heart rates have been more stable.  Her energy is much improved.  She has been more active at home.  Her SOB is stable.  Today, she denies symptoms of palpitations, chest pain,  orthopnea, PND, lower extremity edema, dizziness, presyncope, syncope, or neurologic sequela.  The patient feels that she is tolerating medications without difficulties and is otherwise without complaint today.   Past Medical History  Diagnosis Date  . Diastolic CHF, chronic   . Other and unspecified hyperlipidemia     EF 60-65% by echo 11/2011  . Unspecified hypothyroidism   . Overweight   . Diverticulosis of colon (without mention of hemorrhage)   . Benign neoplasm of colon   . Osteoarthrosis, unspecified whether generalized or localized, unspecified site   . Disorder of bone and cartilage, unspecified   . Depressive disorder, not elsewhere classified   . Venous insufficiency   . Renal insufficiency   . Paroxysmal atrial fibrillation   . Atrial flutter   . Tachycardia-bradycardia    Past Surgical History  Procedure Date  . Tonsillectomy     Current Outpatient Prescriptions  Medication Sig Dispense Refill  . albuterol (PROAIR HFA) 108 (90 BASE) MCG/ACT inhaler Inhale 2 puffs into the lungs every 6 (six) hours as needed.        Marland Kitchen buPROPion (WELLBUTRIN XL) 300 MG 24 hr tablet Take 300 mg by mouth daily.        . dabigatran (PRADAXA) 75 MG CAPS Take 1 capsule (75 mg total) by mouth every 12 (twelve) hours.  60 capsule  11  . escitalopram (LEXAPRO) 20 MG tablet Take 20 mg by mouth daily.      . furosemide (LASIX) 20 MG tablet IMPORTANT: Do not restart until Saturday 11/23/11! On Saturday February 23rd, please begin taking 2 tablets in the morning and 2 tablets in the evening (by mouth).      Marland Kitchen lithium carbonate (LITHOBID) 300 MG CR tablet Take  300 mg by mouth daily.      . metoprolol succinate (TOPROL XL) 25 MG 24 hr tablet Take 1 1/2 tablets twice daily  90 tablet  11  . OLANZapine (ZYPREXA) 2.5 MG tablet Take 0.5 tablets (1.25 mg total) by mouth at bedtime. Please check the dose you were taking at home. We would like you to continue the dose you were taking prior to admission to the hospital. You were taking 1.25mg  daily while in the hospital. If your home dose is different than a half tablet daily, please contact our office to update our records.      . polyethylene glycol (MIRALAX / GLYCOLAX) packet Take 17 g by mouth 2 (two) times daily.        . potassium chloride SA (K-DUR,KLOR-CON) 20 MEQ tablet Take 1 tablet (20 mEq total) by mouth 2 (two) times daily.      . simvastatin (ZOCOR) 20 MG tablet TAKE ONE TABLET BY MOUTH AT BEDTIME  30 tablet  6  . SYNTHROID 125 MCG tablet TAKE ONE TABLET BY MOUTH DAILY  30 each  1  . DISCONTD: levothyroxine (SYNTHROID, LEVOTHROID) 112 MCG tablet Take 112 mcg by mouth daily.          No Known Allergies  History   Social History  . Marital Status: Married    Spouse Name: Clance Boll  Number of Children: 4  . Years of Education: N/A   Occupational History  .     Social History Main Topics  . Smoking status: Never Smoker   . Smokeless tobacco: Never Used  . Alcohol Use: No  . Drug Use: No  . Sexually Active: Not on file   Other Topics Concern  . Not on file   Social History Narrative  . No narrative on file    Family History  Problem Relation Age of Onset  . Heart failure Mother   . Arthritis Mother   . Heart disease Father   . Heart disease Other    Physical Exam: Filed Vitals:   01/24/12 1119  BP: 122/59  Pulse: 51  Height: 5\' 8"  (1.727 m)  Weight: 204 lb (92.534 kg)    GEN- The patient is well appearing, alert and oriented x 3 today.   Head- normocephalic, atraumatic Eyes-  Sclera clear, conjunctiva pink Ears- hearing intact Oropharynx- clear Neck- supple, no  JVP Lymph- no cervical lymphadenopathy Lungs- Clear to ausculation bilaterally, normal work of breathing Heart- brady regular rhythm, no murmurs, rubs or gallops, PMI not laterally displaced GI- soft, NT, ND, + BS Extremities- no clubbing, cyanosis, 1+ edema   Assessment and Plan:

## 2012-01-24 NOTE — Assessment & Plan Note (Signed)
Stable Continue pradaxa Check BMET and CBC today

## 2012-01-24 NOTE — Assessment & Plan Note (Signed)
Improved She has been started back on lithium at a lower dose.

## 2012-01-24 NOTE — Assessment & Plan Note (Signed)
euvolemic today No changes

## 2012-01-24 NOTE — Patient Instructions (Signed)
Your physician recommends that you schedule a follow-up appointment in: 2 months with Dr Rayann Heman  Your physician recommends that you return for lab work today

## 2012-01-24 NOTE — Assessment & Plan Note (Signed)
Presently stable We should avoid PPM if able

## 2012-02-06 ENCOUNTER — Other Ambulatory Visit: Payer: Self-pay | Admitting: *Deleted

## 2012-02-06 MED ORDER — POTASSIUM CHLORIDE CRYS ER 20 MEQ PO TBCR: 20.0000 meq | EXTENDED_RELEASE_TABLET | Freq: Two times a day (BID) | ORAL | Status: DC

## 2012-02-14 ENCOUNTER — Ambulatory Visit: Payer: Medicare Other | Admitting: Pulmonary Disease

## 2012-02-27 ENCOUNTER — Encounter: Payer: Self-pay | Admitting: Pulmonary Disease

## 2012-02-27 ENCOUNTER — Ambulatory Visit (INDEPENDENT_AMBULATORY_CARE_PROVIDER_SITE_OTHER): Payer: Medicare Other | Admitting: Pulmonary Disease

## 2012-02-27 VITALS — BP 118/60 | HR 52 | Temp 97.2°F | Ht 68.0 in | Wt 205.0 lb

## 2012-02-27 DIAGNOSIS — E663 Overweight: Secondary | ICD-10-CM

## 2012-02-27 DIAGNOSIS — M1990 Unspecified osteoarthritis, unspecified site: Secondary | ICD-10-CM

## 2012-02-27 DIAGNOSIS — E039 Hypothyroidism, unspecified: Secondary | ICD-10-CM

## 2012-02-27 DIAGNOSIS — I679 Cerebrovascular disease, unspecified: Secondary | ICD-10-CM

## 2012-02-27 DIAGNOSIS — M899 Disorder of bone, unspecified: Secondary | ICD-10-CM

## 2012-02-27 DIAGNOSIS — I872 Venous insufficiency (chronic) (peripheral): Secondary | ICD-10-CM

## 2012-02-27 DIAGNOSIS — F329 Major depressive disorder, single episode, unspecified: Secondary | ICD-10-CM

## 2012-02-27 DIAGNOSIS — M949 Disorder of cartilage, unspecified: Secondary | ICD-10-CM

## 2012-02-27 DIAGNOSIS — I4891 Unspecified atrial fibrillation: Secondary | ICD-10-CM

## 2012-02-27 DIAGNOSIS — F3289 Other specified depressive episodes: Secondary | ICD-10-CM

## 2012-02-27 DIAGNOSIS — I5032 Chronic diastolic (congestive) heart failure: Secondary | ICD-10-CM

## 2012-02-27 DIAGNOSIS — E785 Hyperlipidemia, unspecified: Secondary | ICD-10-CM

## 2012-02-27 NOTE — Progress Notes (Addendum)
Subjective:    Patient ID: Sonya Kaufman, female    DOB: 10-21-1930, 78 y.o.   MRN: [id number]  HPI 78 y/o WF here for a follow up visit... she has mult med problems as noted below... she is followed by DrSteiner for Psychiatry w/ severe depression on 5 diff meds as below...  ~  Mar11:  15mo f/u visit- she's had some worsening depression & meds adjusted per DrSteiner w/ Lithium incr to Bid & Deplin 15mg /d added... she would like full thyroid function tests to be sure that the current dose of Synthroid (178mcg/d) is adeq replacement Rx for her... otherw feeling satis & stable on current meds.  **Note: TFT's WNL but low-end & we decided to incr Synthroid to 165mcg/d... ~  [id number]:  routine ROV feeling OK & w/o any new complaints or concerns... exam shows irreg irreg heart w/ AFib on EKG, rate 106 (80-120 range)... daughter indicates strong FamHx AFib in several relatives but Sonya Kaufman hasn't had this before... Thyroid dose was incr to 119mcg/d last OV & we will recheck labs, check CXR, & refer to Cards for 2DEcho & Rx... for now start low dose BBlocker Rx.  ~  Feb 08, 2011:  1mo ROV & post hosp visit> she was hosp 2/12 by [facility] w/ RLL Pneum & CHF, then had to be readmit 3/12 by Cards w/ acute on chronic diastolic CHF w/ recurrent PAF;  She had f/u DrAllred  3/12 w/ meds adjusted- SEE BELOW...  Thyroid medication had to be adjusted & currently on Synthroid 166mcg/d & doing satis...  ~  August 16, 2011:  78mo ROV & her CC is nasal congestion, otherw feels she is doing satis, mood sl improved as well on meds from DrSteiner; she had f/u DrAllred 10/12 & was essent asymptomatic w/o CP, palpit, ch in SOB, edema etc; EKG in AFlutter on rate control & Pradaxa, CHF controlled w/ Lasix, Labs were checked> BUN=17, Creat=1.5, TSH=0.93, Lithium level= 0.62 (0.80-1.40)... She declines further labs today...  She already had the 2012 Flu vaccine, and review of record indicates that she is due a TDAP today- done...  See prob  list below>>  ~  Feb 27, 2012:  70mo ROV & review> Sonya Kaufman is doing quite well now according to her hx & confirmed by daughter; she was hosp 2/13 w/ acute on chr diastolic CHF & her Lithium level was too high- she was diuresed & Lithium adjusted & she is much improved (their notes are reviewed); meds adjusted by Cards- DrAllred (AFib on Pradaxa & Metoprolol and hasn't required pacer)    We reviewed prob list, meds, xrays and labs> see below>> LABS 6/13 ==> FLP looks good on Prav40;  Chems- ok w/ stable Creat=1.5, and normal LFTs...         Problem List:  ASTHMATIC BRONCHITIS, ACUTE (ICD-466.0) - on PROAIR as needed; she is a non-smoker> denies cough, sputum, hemoptysis, worsening dyspnea,  wheezing, chest pains, snoring, daytime hypersomnolence, etc...  ~  CXR 11/11 showed cardiomeg, clear lungs, NAD.Marland Kitchen. ~  CXR 3/12 showed cardiomeg, vasc congestion, mild interst edema> c/w mild CHF... ~  CXR 2/13 showed mild cardiomegaly, mild vasc congestion, mild basilar atx [facility] for CHF). ~  CTAngio 2/13 showed no evid PE, cardiomeg, CHF/ mild pulm edema, sm effusions...  ACUTE ON CHRONIC DIASTOLIC HEART FAILURE> on METOPROLOL ER 50mg - 1.5tabsBid, LASIX 20mg - 2Bid, KCl 64mEq Bid,and PRADAXA 75mg  bid... ATRIAL FIBRILLATION & ATRIAL FLUTTER - new onset noted 11/11 OV & pt asymptomatic w/o CP,  palpit, dizzy, SOB, etc> referred to DrAllred. ~  She has had freq med adjustments in the interval by Cards/ DrAllred> on & off Cardizem, up & down on Pradaxa Loss adjuster, chartered & Epic reviewed). ~  5/12:  They report current meds: METOPROLOL 25mg - 2Bid, LASIX 20mg - 2AM & 1PM, KCl 66mEq Bid,and PRADAXA 150mg  bid. ~  11/12:  She had recent check up w/ DrAllred & meds now listed> METOPROLOL ER 50mg Bid, LASIX 20mg - 2AM & 1PM, KCl 38mEq Bid,and PRADAXA 75mg  bid. ~  2/13:  Hosp for acute on chronic diastolic CHF & AFib- managed by DrAllred & meds adjusted as noted above; office f/u 4/13 & stable...  CEREBROVASCULAR DISEASE (ICD-437.9)  - on PRADAXA 75mg  Bid...  ~  prev eval by DrLove in 2003... CT showed bilat sm vessel ischemic disease... prev on ASA 81mg /d.  VENOUS INSUFFICIENCY (ICD-459.81) - she takes LASIX as above & follows a low sodium diet and uses support hose as needed...  HYPERLIPIDEMIA (ICD-272.4) - on SIMVASTATIN 20mg /d + FishOil 1000mg /d... ~  [place] 11/07 showed TChol 140, Tg 90, HDL 52, LDL 70... ~  [place] 5/09 showed TChol 181, TG 105, HDL 51, LDL 110... rec- same med, better diet for now. ~  FLP 9/10 showed TChol 174, TG 129, HDL 63, LDL 86 ~  She needs to return FASTING for FLP recheck... ~  [place] 3/12 showed TChol 121, TG 58, HDL 57, LDL 52... Note LFTs sl elev w/ SGOT 67, SGPT 74... ~  [place] 6/13 on Simva20 showed TChol 141, TG 103, HDL 52, LDL 69... Note LFTs are wnl now...  HYPOTHYROIDISM (ICD-244.9) - currently on SYNTHROID 134mcg/d... ~  prev TSH 7/08 was 3.56 ~  labs 5/09 showed TSH= 2.06 ~  labs 9/10 showed TSH= 2.30 ~  labs 3/11 showed TSH= 3.13, FreeT3= 2.4 (2.3-4.2), FreeT4= 0.9 (0.6-1.6)... rec incr to 149mcg/d. ~  labs 11/11 on Levoth125 showed TSH= 0.92... keep same for now. ~  During the 2/12 hosp she was cut back to 1107mcg/d dose & she says she feels better on this dose. ~  Labs 3/12 showed TSH= 5.80 & reminded to take med every day... ~  Labs 10/12 showed TSH= 0.62... We have dose listed as 186mcg/d. ~  Labs 2/13 in [place] showed TSH= 1.16 on Synthroid123mcg/d now...  OVERWEIGHT (ICD-278.02) - diet + exercise program discussed... ~  weight 5/09 = 206# ~  weight 9/10 = 206# ~  weight 3/11 = 213# ~  weight 11/11 = 209# ~  Weight 5/12 = 206# ~  Weight 11/12 = 207# ~  Weight 5/13 = 205#  DIVERTICULOSIS OF COLON (ICD-562.10) COLONIC POLYPS (ICD-211.3) - she had routine colonoscopy 10/10 by DrStark showing several 4-14mm polyps & mild divertics- path showed one tubular adenoma & f/u planned 81yrs.  OSTEOARTHRITIS (ICD-715.90) - hx knee pain in past, now c/o intermiitent right hip pain... she  never filled Mobic Rx & uses OTC pain meds Prn.  OSTEOPENIA (ICD-733.90) - I can't find prev BMD in the system... takes Calcium, MVI, Vit D... ~  labs 5/09 showed Vit D level = 31... rec- 1000 u Vit D OTC supplement.  DEPRESSION (ICD-311) - This has been her major problem over the years... several psyche hospitalizations and one OD admit... followed by DrSteiner every 2-3 months, on several meds & adjusted frequently... Currently on WELLBUTRIN XL 300,  LEXAPRO 20,  ZYPREXA 2.5 (taking 1.5 tabs daily), &  LITHOBID incr to 2 tabs daily... ~  9/10:  she reports doing very well  on her regimen- stable w/o acute problems. ~  3/11:  reports incr depressive symptoms- meds adjusted & Deplin added by DrSteiner. ~  11/11: reports stable on her 5 med regimen... she & husb continue to care for their 33 y/o son w/ CP. ~  5/12: she is currently on 4 meds from DrSteiner, husb ill, kids having to care for them + handicaped brother... ~  11/12: current meds listed as> WELLBUTRIN XL 300,  LEXAPRO 20,  ZYPREXA 2.5 (taking 1.5 tabs daily), &  LITHOBID incr to 2 tabs daily... ~  5/13:  Her meds have been adjusted & daugh says much improved> on WELLBUTRIN XL 300,  LEXAPRO 20,  ZYPREXA 2.5, &  LITHOBID 300mg /d.  HEALTH MAINTENANCE: ~  GI:  Followed by DrStark w/ colon 2010 as above... ~  GYN:   ~  Immunizations:  She gets the yearly Flu vaccine;  She had Pneumovax prev;  Given TDAP 11/12 in office...   Past Surgical History  Procedure Date  . Tonsillectomy     Outpatient Encounter Prescriptions as of 02/27/2012  Medication Sig Dispense Refill  . albuterol (PROAIR HFA) 108 (90 BASE) MCG/ACT inhaler Inhale 2 puffs into the lungs every 6 (six) hours as needed.        Marland Kitchen buPROPion (WELLBUTRIN XL) 300 MG 24 hr tablet Take 300 mg by mouth daily.        . dabigatran (PRADAXA) 75 MG CAPS Take 1 capsule (75 mg total) by mouth every 12 (twelve) hours.  60 capsule  11  . escitalopram (LEXAPRO) 20 MG tablet Take 20 mg by  mouth daily.      . furosemide (LASIX) 20 MG tablet IMPORTANT: Do not restart until Saturday 11/23/11! On Saturday February 23rd, please begin taking 2 tablets in the morning and 2 tablets in the evening (by mouth).      Marland Kitchen lithium carbonate (LITHOBID) 300 MG CR tablet Take 300 mg by mouth daily.      . metoprolol succinate (TOPROL XL) 25 MG 24 hr tablet Take 1 1/2 tablets twice daily  90 tablet  11  . OLANZapine (ZYPREXA) 2.5 MG tablet Take 0.5 tablets (1.25 mg total) by mouth at bedtime. Please check the dose you were taking at home. We would like you to continue the dose you were taking prior to admission to the hospital. You were taking 1.25mg  daily while in the hospital. If your home dose is different than a half tablet daily, please contact our office to update our records.      . polyethylene glycol (MIRALAX / GLYCOLAX) packet Take 17 g by mouth 2 (two) times daily.        . potassium chloride SA (K-DUR,KLOR-CON) 20 MEQ tablet Take 1 tablet (20 mEq total) by mouth 2 (two) times daily.  60 tablet  6  . simvastatin (ZOCOR) 20 MG tablet TAKE ONE TABLET BY MOUTH AT BEDTIME  30 tablet  6  . SYNTHROID 125 MCG tablet TAKE ONE TABLET BY MOUTH DAILY  30 each  1    No Known Allergies   Current Medications, Allergies, Past Medical History, Past Surgical History, Family History, and Social History were reviewed in [employer] record.    Review of Systems         See HPI - all other systems neg except as noted... The patient complains of SOB/ DOE, & depression.  The patient denies anorexia, fever, weight loss, weight gain, vision loss, decreased hearing, hoarseness, chest pain,  syncope, peripheral edema, prolonged cough, headaches, hemoptysis, abdominal pain, melena, hematochezia, severe indigestion/heartburn, hematuria, incontinence, muscle weakness, suspicious skin lesions, transient blindness, difficulty walking, unusual weight change, abnormal bleeding, enlarged lymph nodes,  and angioedema.     Objective:   Physical Exam     WD, Overweight, 78 y/o WF in NAD... she has a flat affect... Vital Signs:  Reviewed... GENERAL:  Alert & oriented; pleasant & cooperative... HEENT:  [place]/AT, EOM-wnl, PERRLA, EACs-clear, TMs-wnl, NOSE-clear, THROAT-clear & wnl. NECK:  Supple w/ fairROM; no JVD; normal carotid impulses w/o bruits; no thyromegaly or nodules palpated; no lymphadenopathy. CHEST:  Clear to P & A; without wheezes/ rales/ or rhonchi heard... HEART:  Irreg rhythm w/ variable VR; without murmurs/ rubs/ or gallops detected... ABDOMEN:  Obese, soft & nontender; normal bowel sounds; no organomegaly or masses palpated... EXT: without deformities, mild arthritic changes; no varicose veins/ +venous insuffic/ no edema. NEURO:  CN's intact;  no focal neuro deficits... DERM:  No lesions noted; no rash etc...   Assessment & Plan:   CARDIAC>  Followed by DrAllred- Diastolic CHF, AFib/Flutter, etc>  Stable on Pradaxa, Toprol, Lasix... Continue same...  LIPIDS>  She is tol Simva20 + FishOil very well> f/u FLP & LFTs all look good- continue same Rx...  HYPOTHY>  On Syntroid 164mcg/d now & stable clinically; ?dose incr 112==>125 in [place]?  Overweight>  We discussed diet + exercise program...  GI>  Stable...  ORTHO>  DJD, Osteopenia> she uses OTC analgesics prn but needs Calcium, MVI, Vit D supplementation...  DEPRESSION>  A major prob for her, she sees DrSteiner for meds, continue regular f/u w/ Psychiatry...   Patient's Medications  New Prescriptions   No medications on file  Previous Medications   ALBUTEROL (PROAIR HFA) 108 (90 BASE) MCG/ACT INHALER    Inhale 2 puffs into the lungs every 6 (six) hours as needed.     BUPROPION (WELLBUTRIN XL) 300 MG 24 HR TABLET    Take 300 mg by mouth daily.     DABIGATRAN (PRADAXA) 75 MG CAPS    Take 1 capsule (75 mg total) by mouth every 12 (twelve) hours.   ESCITALOPRAM (LEXAPRO) 20 MG TABLET    Take 20 mg by mouth daily.    LITHIUM CARBONATE (LITHOBID) 300 MG CR TABLET    Take 300 mg by mouth daily.   METOPROLOL SUCCINATE (TOPROL XL) 25 MG 24 HR TABLET    Take 1 1/2 tablets twice daily   POLYETHYLENE GLYCOL (MIRALAX / GLYCOLAX) PACKET    Take 17 g by mouth 2 (two) times daily.     POTASSIUM CHLORIDE SA (K-DUR,KLOR-CON) 20 MEQ TABLET    Take 1 tablet (20 mEq total) by mouth 2 (two) times daily.   SIMVASTATIN (ZOCOR) 20 MG TABLET    TAKE ONE TABLET BY MOUTH AT BEDTIME   SYNTHROID 125 MCG TABLET    TAKE ONE TABLET BY MOUTH DAILY  Modified Medications   Modified Medication Previous Medication   FUROSEMIDE (LASIX) 20 MG TABLET furosemide (LASIX) 20 MG tablet      Take 2 tablets by mouth two times daily    OLANZAPINE (ZYPREXA) 2.5 MG TABLET OLANZapine (ZYPREXA) 2.5 MG tablet      Take 2.5 mg by mouth at bedtime.   Discontinued Medications   No medications on file

## 2012-02-27 NOTE — Patient Instructions (Addendum)
Today we updated your med list in our EPIC system...    Continue your current medications the same...  We reviewed your hosp records including your XRays & blood work...  Call for any questions...  Let's continue our 6 month follow up visits.Marland KitchenMarland Kitchen

## 2012-02-28 ENCOUNTER — Other Ambulatory Visit: Payer: Self-pay | Admitting: Pulmonary Disease

## 2012-02-28 DIAGNOSIS — I5032 Chronic diastolic (congestive) heart failure: Secondary | ICD-10-CM

## 2012-02-28 DIAGNOSIS — E785 Hyperlipidemia, unspecified: Secondary | ICD-10-CM

## 2012-03-20 ENCOUNTER — Other Ambulatory Visit (INDEPENDENT_AMBULATORY_CARE_PROVIDER_SITE_OTHER): Payer: Medicare Other

## 2012-03-20 DIAGNOSIS — I5032 Chronic diastolic (congestive) heart failure: Secondary | ICD-10-CM

## 2012-03-20 DIAGNOSIS — E785 Hyperlipidemia, unspecified: Secondary | ICD-10-CM

## 2012-03-20 LAB — BASIC METABOLIC PANEL
BUN: 16 mg/dL (ref 6–23)
Calcium: 9.6 mg/dL (ref 8.4–10.5)
Creatinine, Ser: 1.5 mg/dL — ABNORMAL HIGH (ref 0.4–1.2)

## 2012-03-20 LAB — LIPID PANEL
HDL: 51.5 mg/dL (ref >39.00)
LDL Cholesterol: 69 mg/dL (ref 0–99)
Total CHOL/HDL Ratio: 3
Triglycerides: 103 mg/dL (ref 0.0–149.0)
VLDL: 20.6 mg/dL (ref 0.0–40.0)

## 2012-03-20 LAB — HEPATIC FUNCTION PANEL
Bilirubin, Direct: 0.1 mg/dL (ref 0.0–0.3)
Total Bilirubin: 0.5 mg/dL (ref 0.3–1.2)

## 2012-03-25 ENCOUNTER — Encounter: Payer: Self-pay | Admitting: Internal Medicine

## 2012-03-25 ENCOUNTER — Ambulatory Visit (INDEPENDENT_AMBULATORY_CARE_PROVIDER_SITE_OTHER): Payer: Medicare Other | Admitting: Internal Medicine

## 2012-03-25 VITALS — BP 123/66 | HR 56 | Resp 18 | Ht 67.0 in | Wt 205.8 lb

## 2012-03-25 DIAGNOSIS — R001 Bradycardia, unspecified: Secondary | ICD-10-CM

## 2012-03-25 DIAGNOSIS — I5032 Chronic diastolic (congestive) heart failure: Secondary | ICD-10-CM

## 2012-03-25 DIAGNOSIS — I498 Other specified cardiac arrhythmias: Secondary | ICD-10-CM

## 2012-03-25 DIAGNOSIS — I4891 Unspecified atrial fibrillation: Secondary | ICD-10-CM

## 2012-03-25 NOTE — Assessment & Plan Note (Signed)
Stable and maintaining sinus Continue pradaxa

## 2012-03-25 NOTE — Patient Instructions (Addendum)
Your physician recommends that you schedule a follow-up appointment in 3 months with Dr Allred    

## 2012-03-25 NOTE — Assessment & Plan Note (Signed)
euvolemic today No changes

## 2012-03-25 NOTE — Assessment & Plan Note (Signed)
improved

## 2012-03-25 NOTE — Progress Notes (Signed)
PCP: Noralee Space, MD  Sonya Kaufman is a 78 y.o. female who presents today for routine cardiology followup.  Since last being seen in our clinic, the patient reports doing very well.  Today, she denies symptoms of palpitations, chest pain, shortness of breath,  lower extremity edema, dizziness, presyncope, or syncope.  The patient is otherwise without complaint today.   Past Medical History  Diagnosis Date  . Diastolic CHF, chronic   . Other and unspecified hyperlipidemia     EF 60-65% by echo 11/2011  . Unspecified hypothyroidism   . Overweight   . Diverticulosis of colon (without mention of hemorrhage)   . Benign neoplasm of colon   . Osteoarthrosis, unspecified whether generalized or localized, unspecified site   . Disorder of bone and cartilage, unspecified   . Depressive disorder, not elsewhere classified   . Venous insufficiency   . Renal insufficiency   . Paroxysmal atrial fibrillation   . Atrial flutter   . Tachycardia-bradycardia    Past Surgical History  Procedure Date  . Tonsillectomy     Current Outpatient Prescriptions  Medication Sig Dispense Refill  . albuterol (PROAIR HFA) 108 (90 BASE) MCG/ACT inhaler Inhale 2 puffs into the lungs every 6 (six) hours as needed.        Marland Kitchen buPROPion (WELLBUTRIN XL) 300 MG 24 hr tablet Take 300 mg by mouth daily.        . dabigatran (PRADAXA) 75 MG CAPS Take 1 capsule (75 mg total) by mouth every 12 (twelve) hours.  60 capsule  11  . escitalopram (LEXAPRO) 20 MG tablet Take 20 mg by mouth daily.      . furosemide (LASIX) 20 MG tablet Take 2 tablets by mouth two times daily      . lithium carbonate (LITHOBID) 300 MG CR tablet Take 300 mg by mouth daily.      . metoprolol succinate (TOPROL XL) 25 MG 24 hr tablet Take 1 1/2 tablets twice daily  90 tablet  11  . OLANZapine (ZYPREXA) 2.5 MG tablet Take 2.5 mg by mouth at bedtime.      . polyethylene glycol (MIRALAX / GLYCOLAX) packet Take 17 g by mouth 2 (two) times daily.        .  potassium chloride SA (K-DUR,KLOR-CON) 20 MEQ tablet Take 1 tablet (20 mEq total) by mouth 2 (two) times daily.  60 tablet  6  . simvastatin (ZOCOR) 20 MG tablet TAKE ONE TABLET BY MOUTH AT BEDTIME  30 tablet  6  . SYNTHROID 125 MCG tablet TAKE ONE TABLET BY MOUTH DAILY  30 each  1    Physical Exam: Filed Vitals:   03/25/12 1201  BP: 123/66  Pulse: 56  Resp: 18  Height: 5\' 7"  (1.702 m)  Weight: 205 lb 12.8 oz (93.35 kg)    GEN- The patient is well appearing, alert and oriented x 3 today.   Head- normocephalic, atraumatic Eyes-  Sclera clear, conjunctiva pink Ears- hearing intact Oropharynx- clear Lungs- Clear to ausculation bilaterally, normal work of breathing Heart- Regular rate and rhythm, no murmurs, rubs or gallops, PMI not laterally displaced GI- soft, NT, ND, + BS Extremities- no clubbing, cyanosis, or edema   Assessment and Plan:

## 2012-04-22 ENCOUNTER — Other Ambulatory Visit: Payer: Self-pay | Admitting: Pulmonary Disease

## 2012-05-12 ENCOUNTER — Telehealth: Payer: Self-pay | Admitting: Internal Medicine

## 2012-05-12 MED ORDER — FUROSEMIDE 20 MG PO TABS: ORAL_TABLET | ORAL | Status: DC

## 2012-05-12 NOTE — Telephone Encounter (Signed)
New msg Pt's daughter wanted to talk to you about her taking extra lasix because of fluid. Please call her back

## 2012-05-12 NOTE — Telephone Encounter (Signed)
Spoke with daughter this has just started in the last few weeks.  She has been taking and extra fluid pil about ever 3 days due to 2-3 pound weight gain every 3 days   She will need to have her BMP checked at her return office visit  I have called in a new rx for her so she will not run out

## 2012-05-19 ENCOUNTER — Telehealth: Payer: Self-pay | Admitting: Cardiology

## 2012-05-19 ENCOUNTER — Other Ambulatory Visit: Payer: Self-pay | Admitting: *Deleted

## 2012-05-19 MED ORDER — FUROSEMIDE 20 MG PO TABS: ORAL_TABLET | ORAL | Status: DC

## 2012-05-19 NOTE — Telephone Encounter (Signed)
Daughter called for paradaxa 75 mg samples, put them at front desk for her.

## 2012-06-26 ENCOUNTER — Ambulatory Visit (INDEPENDENT_AMBULATORY_CARE_PROVIDER_SITE_OTHER): Payer: Medicare Other | Admitting: Internal Medicine

## 2012-06-26 ENCOUNTER — Encounter: Payer: Self-pay | Admitting: Internal Medicine

## 2012-06-26 VITALS — BP 131/51 | HR 50 | Ht 68.0 in | Wt 204.0 lb

## 2012-06-26 DIAGNOSIS — I5032 Chronic diastolic (congestive) heart failure: Secondary | ICD-10-CM

## 2012-06-26 DIAGNOSIS — R001 Bradycardia, unspecified: Secondary | ICD-10-CM

## 2012-06-26 DIAGNOSIS — I498 Other specified cardiac arrhythmias: Secondary | ICD-10-CM

## 2012-06-26 DIAGNOSIS — I4891 Unspecified atrial fibrillation: Secondary | ICD-10-CM

## 2012-06-26 NOTE — Progress Notes (Signed)
PCP: Noralee Space, MD  Sonya Kaufman is a 78 y.o. female who presents today for routine cardiology followup.  Since last being seen in our clinic, the patient reports doing very well.  Today, she denies symptoms of palpitations, chest pain, shortness of breath,  lower extremity edema, dizziness, presyncope, or syncope.  The patient is otherwise without complaint today.   Past Medical History  Diagnosis Date  . Diastolic CHF, chronic   . Other and unspecified hyperlipidemia     EF 60-65% by echo 11/2011  . Unspecified hypothyroidism   . Overweight   . Diverticulosis of colon (without mention of hemorrhage)   . Benign neoplasm of colon   . Osteoarthrosis, unspecified whether generalized or localized, unspecified site   . Disorder of bone and cartilage, unspecified   . Depressive disorder, not elsewhere classified   . Venous insufficiency   . Renal insufficiency   . Paroxysmal atrial fibrillation   . Atrial flutter   . Tachycardia-bradycardia    Past Surgical History  Procedure Date  . Tonsillectomy     Current Outpatient Prescriptions  Medication Sig Dispense Refill  . albuterol (PROAIR HFA) 108 (90 BASE) MCG/ACT inhaler Inhale 2 puffs into the lungs every 6 (six) hours as needed.        Marland Kitchen buPROPion (WELLBUTRIN XL) 300 MG 24 hr tablet Take 300 mg by mouth daily.        . dabigatran (PRADAXA) 75 MG CAPS Take 1 capsule (75 mg total) by mouth every 12 (twelve) hours.  60 capsule  11  . escitalopram (LEXAPRO) 20 MG tablet Take 20 mg by mouth daily.      . furosemide (LASIX) 20 MG tablet Take 2 tablets by mouth two times daily and as needed for weight gain of 2 or more pounds in a day  135 tablet  3  . lithium carbonate (LITHOBID) 300 MG CR tablet Take 300 mg by mouth daily.      . metoprolol succinate (TOPROL-XL) 25 MG 24 hr tablet Take 25 mg by mouth 2 (two) times daily.      Marland Kitchen OLANZapine (ZYPREXA) 2.5 MG tablet Take 2.5 mg by mouth at bedtime.      . polyethylene glycol (MIRALAX /  GLYCOLAX) packet Take 17 g by mouth 2 (two) times daily.        . potassium chloride SA (K-DUR,KLOR-CON) 20 MEQ tablet Take 1 tablet (20 mEq total) by mouth 2 (two) times daily.  60 tablet  6  . simvastatin (ZOCOR) 20 MG tablet TAKE ONE TABLET BY MOUTH AT BEDTIME  30 tablet  6  . SYNTHROID 125 MCG tablet TAKE ONE TABLET BY MOUTH DAILY  30 each  3  . DISCONTD: metoprolol succinate (TOPROL XL) 25 MG 24 hr tablet Take 1 1/2 tablets twice daily  90 tablet  11    Physical Exam: Filed Vitals:   06/26/12 1130  BP: 131/51  Pulse: 50  Height: 5\' 8"  (1.727 m)  Weight: 204 lb (92.534 kg)  SpO2: 96%    GEN- The patient is well appearing, alert and oriented x 3 today.   Head- normocephalic, atraumatic Eyes-  Sclera clear, conjunctiva pink Ears- hearing intact Oropharynx- clear Lungs- Clear to ausculation bilaterally, normal work of breathing Heart- Regular rate and rhythm, no murmurs, rubs or gallops, PMI not laterally displaced GI- soft, NT, ND, + BS Extremities- no clubbing, cyanosis, or edema  ekg today reveals sinus rhythm 50 bpm, otherwise normal ekg  Assessment and Plan:  Atrial fibrillation  Stable and maintaining sinus  Continue pradaxa  Chronic diastolic heart failure   euvolemic today  No changes Continue daily weights and increase lasix prn 2g sodium restriction  Bradycardia  Stable No indication for pacing at this time

## 2012-06-26 NOTE — Patient Instructions (Addendum)
Your physician recommends that you schedule a follow-up appointment in 3 months with Dr Allred    

## 2012-07-08 ENCOUNTER — Other Ambulatory Visit: Payer: Self-pay | Admitting: Pulmonary Disease

## 2012-07-09 ENCOUNTER — Other Ambulatory Visit: Payer: Self-pay

## 2012-07-09 MED ORDER — FUROSEMIDE 20 MG PO TABS: ORAL_TABLET | ORAL | Status: DC

## 2012-07-31 ENCOUNTER — Other Ambulatory Visit: Payer: Self-pay | Admitting: Pulmonary Disease

## 2012-09-01 ENCOUNTER — Other Ambulatory Visit: Payer: Self-pay | Admitting: Internal Medicine

## 2012-09-01 MED ORDER — DABIGATRAN ETEXILATE MESYLATE 75 MG PO CAPS: 75.0000 mg | ORAL_CAPSULE | Freq: Two times a day (BID) | ORAL | Status: DC

## 2012-09-03 ENCOUNTER — Encounter: Payer: Self-pay | Admitting: *Deleted

## 2012-09-04 ENCOUNTER — Encounter: Payer: Self-pay | Admitting: Pulmonary Disease

## 2012-09-04 ENCOUNTER — Ambulatory Visit (INDEPENDENT_AMBULATORY_CARE_PROVIDER_SITE_OTHER): Payer: Medicare Other | Admitting: Pulmonary Disease

## 2012-09-04 ENCOUNTER — Other Ambulatory Visit (INDEPENDENT_AMBULATORY_CARE_PROVIDER_SITE_OTHER): Payer: Medicare Other

## 2012-09-04 VITALS — BP 132/68 | HR 52 | Temp 99.1°F | Ht 68.0 in | Wt 208.4 lb

## 2012-09-04 DIAGNOSIS — I872 Venous insufficiency (chronic) (peripheral): Secondary | ICD-10-CM

## 2012-09-04 DIAGNOSIS — E663 Overweight: Secondary | ICD-10-CM

## 2012-09-04 DIAGNOSIS — I4891 Unspecified atrial fibrillation: Secondary | ICD-10-CM

## 2012-09-04 DIAGNOSIS — K5730 Diverticulosis of large intestine without perforation or abscess without bleeding: Secondary | ICD-10-CM

## 2012-09-04 DIAGNOSIS — I5032 Chronic diastolic (congestive) heart failure: Secondary | ICD-10-CM

## 2012-09-04 DIAGNOSIS — I679 Cerebrovascular disease, unspecified: Secondary | ICD-10-CM

## 2012-09-04 DIAGNOSIS — F3289 Other specified depressive episodes: Secondary | ICD-10-CM

## 2012-09-04 DIAGNOSIS — M949 Disorder of cartilage, unspecified: Secondary | ICD-10-CM

## 2012-09-04 DIAGNOSIS — E039 Hypothyroidism, unspecified: Secondary | ICD-10-CM

## 2012-09-04 DIAGNOSIS — J209 Acute bronchitis, unspecified: Secondary | ICD-10-CM

## 2012-09-04 DIAGNOSIS — F329 Major depressive disorder, single episode, unspecified: Secondary | ICD-10-CM

## 2012-09-04 DIAGNOSIS — M1990 Unspecified osteoarthritis, unspecified site: Secondary | ICD-10-CM

## 2012-09-04 DIAGNOSIS — E785 Hyperlipidemia, unspecified: Secondary | ICD-10-CM

## 2012-09-04 DIAGNOSIS — M899 Disorder of bone, unspecified: Secondary | ICD-10-CM

## 2012-09-04 DIAGNOSIS — D126 Benign neoplasm of colon, unspecified: Secondary | ICD-10-CM

## 2012-09-04 LAB — BASIC METABOLIC PANEL
BUN: 19 mg/dL (ref 6–23)
Calcium: 9.2 mg/dL (ref 8.4–10.5)
Creatinine, Ser: 1.5 mg/dL — ABNORMAL HIGH (ref 0.4–1.2)

## 2012-09-04 NOTE — Patient Instructions (Addendum)
Today we updated your med list in our EPIC system...    Continue your current medications the same...  Today we did your follow up Metabolic panel...    We will contact you w/ the results when avail...  Call for any problems...  Let's plan a follow up visit in 6 months.Marland KitchenMarland Kitchen

## 2012-09-04 NOTE — Progress Notes (Signed)
Subjective:    Patient ID: Sonya Kaufman, female    DOB: 08/10/1931, 78 y.o.   MRN: [id number]  HPI 78 y/o WF here for a follow up visit... she has mult med problems as noted below... she is followed by DrSteiner for Psychiatry w/ severe depression on 5 diff meds as below...  ~  Sonya Kaufman:  55mo f/u visit- she's had some worsening depression & meds adjusted per DrSteiner w/ Lithium incr to Bid & Deplin 15mg /d added... she would like full thyroid function tests to be sure that the current dose of Synthroid (192mcg/d) is adeq replacement Rx for her... otherw feeling satis & stable on current meds.  **Note: TFT's WNL but low-end & we decided to incr Synthroid to 152mcg/d... ~  Sonya Kaufman:  routine ROV feeling OK & w/o any new complaints or concerns... exam shows irreg irreg heart w/ AFib on EKG, rate 106 (80-120 range)... daughter indicates strong FamHx AFib in several relatives but Sonya Kaufman hasn't had this before... Thyroid dose was incr to 160mcg/d last OV & we will recheck labs, check CXR, & refer to Cards for 2DEcho & Rx... for now start low dose BBlocker Rx.  ~  Feb 08, 2011:  31mo ROV & post hosp visit> she was hosp 2/12 by [facility] w/ RLL Pneum & CHF, then had to be readmit 3/12 by Cards w/ acute on chronic diastolic CHF w/ recurrent PAF;  She had f/u DrAllred  3/12 w/ meds adjusted- SEE BELOW...  Thyroid medication had to be adjusted & currently on Synthroid 119mcg/d & doing satis...  ~  August 16, 2011:  68mo ROV & her CC is nasal congestion, otherw feels she is doing satis, mood sl improved as well on meds from DrSteiner; she had f/u DrAllred 10/12 & was essent asymptomatic w/o CP, palpit, ch in SOB, edema etc; EKG in AFlutter on rate control & Pradaxa, CHF controlled w/ Lasix, Labs were checked> BUN=17, Creat=1.5, TSH=0.93, Lithium level= 0.62 (0.80-1.40)... She declines further labs today...  She already had the 2012 Flu vaccine, and review of record indicates that she is due a TDAP today- done...  See prob  list below>>  ~  Feb 27, 2012:  27mo ROV & review> Sonya Kaufman is doing quite well now according to her hx & confirmed by daughter; she was hosp 2/13 w/ acute on chr diastolic CHF & her Lithium level was too high- she was diuresed & Lithium adjusted & she is much improved (their notes are reviewed); meds adjusted by Cards- DrAllred (AFib on Pradaxa & Metoprolol and hasn't required pacer)    We reviewed prob list, meds, xrays and labs> see below>> LABS 6/13 ==> FLP looks good on Prav40;  Chems- ok w/ stable Creat=1.5, and normal LFTs...   ~  September 04, 2012:  62mo ROV & Sonya Kaufman has had a good 70mo interval- no new complaints or concerns, & confirmed by her daughter who brought her to the office today... We reviewed these following problems during today's office visit:     AB> on Proair prn; breathing is good, no exac & denies cough, phlegm, hemoptysis, SOB, etc; she is too sedentary & needs to incr exercise program...    AFib/Flutter, chr diastolic CHF> on [id number], MetopER25Bid, Lasix20-2Bid, K20Bid; BP=132/68 & she denies CP, palpit, ch in DOE, edema; she saw DrAllred 9/13- stable, no changes made...    Cerebrovasc dis> she has sm vessel dis on old [place]; denies cerebral ischemic symptoms...    VI> she knows to elim sodium,  elevate legs, wear support hose, & continue the Lasix...    Chol> on Simva20; last FLP 6/13 showed TChol 141, TG 103, HDL 52, LDL 69    Hypothy> on Synthroid125/d & last TSH was checked 2/13 = 1.16    Overweight> wt= 208# which is up 3# over the last 73mo; we reviewed diet, exercise, & wt reduction strategies...    GI- Divertics, Polyps> last colonoscopy by DrStark was 10/10 w/ several sm polyps removed- one tubular adenoma & f/u planned 2015; she denies abd pain, n/v, c/d, blood seen...    DJD, Osteopenia> she uses OTC analgesics, calcium, MVI, VitD supplements...    Depression> followed by DrSteiner for Psychiatry on  Lethium300, Wellbutrin300, Lexapro20, Zyprexa2.5-1/2tab; she has  been stable on these meds & doing well overall...  We reviewed prob list, meds, xrays and labs> see below for updates >>  LABS 12/13:  BMet is stable w/ Creat=1.5.Marland KitchenMarland Kitchen         Problem List:  ASTHMATIC BRONCHITIS, ACUTE (ICD-466.0) - on PROAIR as needed; she is a non-smoker> denies cough, sputum, hemoptysis, worsening dyspnea,  wheezing, chest pains, snoring, daytime hypersomnolence, etc...  ~  CXR 11/11 showed cardiomeg, clear lungs, NAD.Marland Kitchen. ~  CXR 3/12 showed cardiomeg, vasc congestion, mild interst edema> c/w mild CHF... ~  CXR 2/13 showed mild cardiomegaly, mild vasc congestion, mild basilar atx [facility] for CHF). ~  CTAngio 2/13 showed no evid PE, cardiomeg, CHF/ mild pulm edema, sm effusions...  ACUTE ON CHRONIC DIASTOLIC HEART FAILURE> on METOPROLOL ER 50mg - 1.5tabsBid, LASIX 20mg - 2Bid, KCl 1mEq Bid,and PRADAXA 75mg  bid... ATRIAL FIBRILLATION & ATRIAL FLUTTER - new onset noted 11/11 OV & pt asymptomatic w/o CP, palpit, dizzy, SOB, etc> referred to DrAllred. ~  She has had freq med adjustments in the interval by Cards/ DrAllred> on & off Cardizem, up & down on Pradaxa Loss adjuster, chartered & Epic reviewed). ~  5/12:  They report current meds: METOPROLOL 25mg - 2Bid, LASIX 20mg - 2AM & 1PM, KCl 20mEq Bid,and PRADAXA 150mg  bid. ~  11/12:  She had recent check up w/ DrAllred & meds now listed> METOPROLOL ER 50mg Bid, LASIX 20mg - 2AM & 1PM, KCl 18mEq Bid,and PRADAXA 75mg  bid. ~  2/13:  Hosp for acute on chronic diastolic CHF & AFib- managed by DrAllred & meds adjusted as noted above; office f/u 4/13 & stable... ~  12/13:  on Pradaxa75Bid, MetopER25Bid, Lasix20-2Bid, K20Bid; BP=132/68 & she denies CP, palpit, ch in DOE, edema; f/u DrAllred 9/13- stable, no changes.  CEREBROVASCULAR DISEASE (ICD-437.9) - on PRADAXA 75mg  Bid...  ~  prev eval by DrLove in 2003... CT showed bilat sm vessel ischemic disease... prev on ASA 81mg /d... She denies any cerebral ischemic symptoms...  VENOUS INSUFFICIENCY (ICD-459.81) -  she takes LASIX as above & follows a low sodium diet and uses support hose as needed...  HYPERLIPIDEMIA (ICD-272.4) - on SIMVASTATIN 20mg /d + FishOil 1000mg /d... ~  [place] 11/07 showed TChol 140, Tg 90, HDL 52, LDL 70... ~  [place] 5/09 showed TChol 181, TG 105, HDL 51, LDL 110... rec- same med, better diet for now. ~  FLP 9/10 showed TChol 174, TG 129, HDL 63, LDL 86 ~  She needs to return FASTING for FLP recheck... ~  [place] 3/12 showed TChol 121, TG 58, HDL 57, LDL 52... Note LFTs sl elev w/ SGOT 67, SGPT 74... ~  [place] 6/13 on Simva20 showed TChol 141, TG 103, HDL 52, LDL 69... Note LFTs are wnl now...  HYPOTHYROIDISM (ICD-244.9) - currently on SYNTHROID 171mcg/d.Marland KitchenMarland Kitchen ~  prev TSH 7/08 was 3.56 ~  labs 5/09 showed TSH= 2.06 ~  labs 9/10 showed TSH= 2.30 ~  labs 3/11 showed TSH= 3.13, FreeT3= 2.4 (2.3-4.2), FreeT4= 0.9 (0.6-1.6)... rec incr to 138mcg/d. ~  labs 11/11 on Levoth125 showed TSH= 0.92... keep same for now. ~  During the 2/12 hosp she was cut back to 161mcg/d dose & she says she feels better on this dose. ~  Labs 3/12 showed TSH= 5.80 & reminded to take med every day... ~  Labs 10/12 showed TSH= 0.62... We have dose listed as 155mcg/d. ~  Labs 2/13 in [place] showed TSH= 1.16 on Synthroid151mcg/d now...  OVERWEIGHT (ICD-278.02) - diet + exercise program discussed... ~  weight 5/09 = 206# ~  weight 9/10 = 206# ~  weight 3/11 = 213# ~  weight 11/11 = 209# ~  Weight 5/12 = 206# ~  Weight 11/12 = 207# ~  Weight 5/13 = 205# ~  Weight 12/13 = 208#  DIVERTICULOSIS OF COLON (ICD-562.10) COLONIC POLYPS (ICD-211.3) - she had routine colonoscopy 10/10 by DrStark showing several 4-79mm polyps & mild divertics- path showed one tubular adenoma & f/u planned 58yrs.  OSTEOARTHRITIS (ICD-715.90) - hx knee pain in past, now c/o intermiitent right hip pain... she never filled Mobic Rx & uses OTC pain meds Prn.  OSTEOPENIA (ICD-733.90) - I can't find prev BMD in the system... takes Calcium, MVI, Vit  D... ~  labs 5/09 showed Vit D level = 31... rec- 1000 u Vit D OTC supplement.  DEPRESSION (ICD-311) - This has been her major problem over the years... several psyche hospitalizations and one OD admit... followed by DrSteiner every 2-3 months, on several meds & adjusted frequently... Currently on WELLBUTRIN XL 300,  LEXAPRO 20,  ZYPREXA 2.5 (taking 0.5 tabs daily), &  LITHOBID 300. ~  9/10:  she reports doing very well on her regimen- stable w/o acute problems. ~  3/11:  reports incr depressive symptoms- meds adjusted & Deplin added by DrSteiner. ~  11/11: reports stable on her 5 med regimen... she & husb continue to care for their 66 y/o son w/ CP. ~  5/12: she is currently on 4 meds from DrSteiner, husb ill, kids having to care for them + handicaped brother... ~  11/12: current meds listed as> WELLBUTRIN XL 300,  LEXAPRO 20,  ZYPREXA 2.5 (taking 1.5 tabs daily), &  LITHOBID incr to 2 tabs daily... ~  5/13:  Her meds have been adjusted & daugh says much improved> on WELLBUTRIN XL 300,  LEXAPRO 20,  ZYPREXA 2.5, &  LITHOBID 300mg /d. ~  12/13: followed by DrSteiner for Psychiatry on  Lethium300, Wellbutrin300, Lexapro20, Zyprexa2.5-1/2tab; she has been stable on these meds & doing well overall...   HEALTH MAINTENANCE: ~  GI:  Followed by DrStark w/ colon 2010 as above... ~  GYN:   ~  Immunizations:  She gets the yearly Flu vaccine;  She had Pneumovax prev;  Given TDAP 11/12 in office...   Past Surgical History  Procedure Date  . Tonsillectomy     Outpatient Encounter Prescriptions as of 09/04/2012  Medication Sig Dispense Refill  . buPROPion (WELLBUTRIN XL) 300 MG 24 hr tablet Take 300 mg by mouth daily.        . dabigatran (PRADAXA) 75 MG CAPS Take 1 capsule (75 mg total) by mouth every 12 (twelve) hours.  60 capsule  11  . escitalopram (LEXAPRO) 20 MG tablet Take 20 mg by mouth daily.      Marland Kitchen  furosemide (LASIX) 20 MG tablet Take 2 tablets by mouth two times daily and as needed for weight  gain of 2 or more pounds in a day  135 tablet  3  . lithium carbonate (LITHOBID) 300 MG CR tablet Take 300 mg by mouth daily.      . metoprolol succinate (TOPROL-XL) 25 MG 24 hr tablet Take 25 mg by mouth 2 (two) times daily.      Marland Kitchen OLANZapine (ZYPREXA) 2.5 MG tablet Take 1/2 tablet by mouth at bedtime      . polyethylene glycol (MIRALAX / GLYCOLAX) packet Take 17 g by mouth 2 (two) times daily.        . potassium chloride SA (K-DUR,KLOR-CON) 20 MEQ tablet Take 1 tablet (20 mEq total) by mouth 2 (two) times daily.  60 tablet  6  . PROAIR HFA 108 (90 BASE) MCG/ACT inhaler INHALE TWO PUFFS EVERY 6 HOURS AS NEEDED  9 g  2  . simvastatin (ZOCOR) 20 MG tablet TAKE ONE TABLET BY MOUTH AT BEDTIME  30 tablet  5  . SYNTHROID 125 MCG tablet TAKE ONE TABLET BY MOUTH DAILY  30 each  3    No Known Allergies   Current Medications, Allergies, Past Medical History, Past Surgical History, Family History, and Social History were reviewed in [employer] record.    Review of Systems         See HPI - all other systems neg except as noted... The patient complains of SOB/ DOE, & depression.  The patient denies anorexia, fever, weight loss, weight gain, vision loss, decreased hearing, hoarseness, chest pain, syncope, peripheral edema, prolonged cough, headaches, hemoptysis, abdominal pain, melena, hematochezia, severe indigestion/heartburn, hematuria, incontinence, muscle weakness, suspicious skin lesions, transient blindness, difficulty walking, unusual weight change, abnormal bleeding, enlarged lymph nodes, and angioedema.     Objective:   Physical Exam     WD, Overweight, 78 y/o WF in NAD... she has a flat affect... Vital Signs:  Reviewed... GENERAL:  Alert & oriented; pleasant & cooperative... HEENT:  [place]/AT, EOM-wnl, PERRLA, EACs-clear, TMs-wnl, NOSE-clear, THROAT-clear & wnl. NECK:  Supple w/ fairROM; no JVD; normal carotid impulses w/o bruits; no thyromegaly or nodules palpated;  no lymphadenopathy. CHEST:  Clear to P & A; without wheezes/ rales/ or rhonchi heard... HEART:  Irreg rhythm w/ variable VR; without murmurs/ rubs/ or gallops detected... ABDOMEN:  Obese, soft & nontender; normal bowel sounds; no organomegaly or masses palpated... EXT: without deformities, mild arthritic changes; no varicose veins/ +venous insuffic/ no edema. NEURO:  CN's intact;  no focal neuro deficits... DERM:  No lesions noted; no rash etc...  RADIOLOGY DATA:  Reviewed in the EPIC EMR & discussed w/ the patient...  LABORATORY DATA:  Reviewed in the EPIC EMR & discussed w/ the patient...   Assessment & Plan:    CARDIAC>  Followed by DrAllred- Diastolic CHF, AFib/Flutter, etc>  Stable on Pradaxa, Toprol, Lasix, KCl... Continue same...  LIPIDS>  She is tol Simva20 + FishOil very well> f/u FLP & LFTs all look good- continue same Rx...  HYPOTHY>  On Syntroid 168mcg/d now & stable clinically; continue same...  Overweight>  We discussed diet + exercise program...  GI>  Stable...  ORTHO>  DJD, Osteopenia> she uses OTC analgesics prn but needs Calcium, MVI, Vit D supplementation...  DEPRESSION>  A major prob for her, she sees DrSteiner for meds, continue regular f/u w/ Psychiatry...   Patient's Medications  New Prescriptions   No medications on file  Previous Medications   BUPROPION (WELLBUTRIN XL) 300 MG 24 HR TABLET    Take 300 mg by mouth daily.     DABIGATRAN (PRADAXA) 75 MG CAPS    Take 1 capsule (75 mg total) by mouth every 12 (twelve) hours.   ESCITALOPRAM (LEXAPRO) 20 MG TABLET    Take 20 mg by mouth daily.   FUROSEMIDE (LASIX) 20 MG TABLET    Take 2 tablets by mouth two times daily and as needed for weight gain of 2 or more pounds in a day   LITHIUM CARBONATE (LITHOBID) 300 MG CR TABLET    Take 300 mg by mouth daily.   METOPROLOL SUCCINATE (TOPROL-XL) 25 MG 24 HR TABLET    Take 25 mg by mouth 2 (two) times daily.   OLANZAPINE (ZYPREXA) 2.5 MG TABLET    Take 1/2 tablet by  mouth at bedtime   POLYETHYLENE GLYCOL (MIRALAX / GLYCOLAX) PACKET    Take 17 g by mouth 2 (two) times daily.     POTASSIUM CHLORIDE SA (K-DUR,KLOR-CON) 20 MEQ TABLET    Take 1 tablet (20 mEq total) by mouth 2 (two) times daily.   PROAIR HFA 108 (90 BASE) MCG/ACT INHALER    INHALE TWO PUFFS EVERY 6 HOURS AS NEEDED   SIMVASTATIN (ZOCOR) 20 MG TABLET    TAKE ONE TABLET BY MOUTH AT BEDTIME   SYNTHROID 125 MCG TABLET    TAKE ONE TABLET BY MOUTH DAILY  Modified Medications   No medications on file  Discontinued Medications   No medications on file

## 2012-09-16 ENCOUNTER — Other Ambulatory Visit: Payer: Self-pay | Admitting: Internal Medicine

## 2012-09-16 MED ORDER — POTASSIUM CHLORIDE CRYS ER 20 MEQ PO TBCR: 20.0000 meq | EXTENDED_RELEASE_TABLET | Freq: Two times a day (BID) | ORAL | Status: DC

## 2012-09-24 ENCOUNTER — Other Ambulatory Visit: Payer: Self-pay | Admitting: Pulmonary Disease

## 2012-09-25 ENCOUNTER — Ambulatory Visit: Payer: Medicare Other | Admitting: Internal Medicine

## 2012-09-28 ENCOUNTER — Telehealth: Payer: Self-pay | Admitting: Pulmonary Disease

## 2012-09-28 MED ORDER — SYNTHROID 125 MCG PO TABS: 125.0000 ug | ORAL_TABLET | Freq: Every day | ORAL | Status: DC

## 2012-09-28 NOTE — Telephone Encounter (Signed)
Rx has been sent in, Shirlean Mylar is aware.

## 2012-11-02 ENCOUNTER — Ambulatory Visit (INDEPENDENT_AMBULATORY_CARE_PROVIDER_SITE_OTHER): Payer: Medicare Other | Admitting: Internal Medicine

## 2012-11-02 ENCOUNTER — Encounter: Payer: Self-pay | Admitting: Internal Medicine

## 2012-11-02 VITALS — BP 108/70 | HR 103 | Ht 68.0 in | Wt 208.4 lb

## 2012-11-02 DIAGNOSIS — I4891 Unspecified atrial fibrillation: Secondary | ICD-10-CM

## 2012-11-02 DIAGNOSIS — I5032 Chronic diastolic (congestive) heart failure: Secondary | ICD-10-CM

## 2012-11-02 DIAGNOSIS — I4892 Unspecified atrial flutter: Secondary | ICD-10-CM

## 2012-11-02 LAB — BASIC METABOLIC PANEL
BUN: 21 mg/dL (ref 6–23)
Chloride: 103 mEq/L (ref 96–112)
GFR: 31.61 mL/min — ABNORMAL LOW (ref >60.00)
Potassium: 4 mEq/L (ref 3.5–5.1)
Sodium: 143 mEq/L (ref 135–145)

## 2012-11-02 LAB — CBC WITH DIFFERENTIAL/PLATELET
Basophils Relative: 0.5 % (ref 0.0–3.0)
Eosinophils Absolute: 0.1 10*3/uL (ref 0.0–0.7)
Eosinophils Relative: 1.4 % (ref 0.0–5.0)
Lymphocytes Relative: 35.5 % (ref 12.0–46.0)
MCHC: 33.3 g/dL (ref 30.0–36.0)
Neutrophils Relative %: 53 % (ref 43.0–77.0)
Platelets: 216 10*3/uL (ref 150.0–400.0)
RBC: 4.66 Mil/uL (ref 3.87–5.11)
WBC: 6.8 10*3/uL (ref 4.5–10.5)

## 2012-11-02 NOTE — Patient Instructions (Signed)
Your physician recommends that you schedule a follow-up appointment in: 4 months with Dr Rayann Heman  Your physician recommends that you return for lab work today: CBC/BMP

## 2012-11-02 NOTE — Assessment & Plan Note (Signed)
Stable No change required today  bmet today 

## 2012-11-02 NOTE — Progress Notes (Signed)
PCP: Noralee Space, MD  Sonya Kaufman is a 79 y.o. female who presents today for routine cardiology followup.  Since last being seen in our clinic, the patient reports doing very well.  Today, she denies symptoms of palpitations, chest pain, shortness of breath,  lower extremity edema, dizziness, presyncope, or syncope.  The patient is otherwise without complaint today.   Past Medical History  Diagnosis Date  . Diastolic CHF, chronic   . Other and unspecified hyperlipidemia     EF 60-65% by echo 11/2011  . Unspecified hypothyroidism   . Overweight   . Diverticulosis of colon (without mention of hemorrhage)   . Benign neoplasm of colon   . Osteoarthrosis, unspecified whether generalized or localized, unspecified site   . Disorder of bone and cartilage, unspecified   . Depressive disorder, not elsewhere classified   . Venous insufficiency   . Renal insufficiency   . Paroxysmal atrial fibrillation   . Atrial flutter   . Tachycardia-bradycardia    Past Surgical History  Procedure Date  . Tonsillectomy     Current Outpatient Prescriptions  Medication Sig Dispense Refill  . buPROPion (WELLBUTRIN XL) 300 MG 24 hr tablet Take 300 mg by mouth daily.        . dabigatran (PRADAXA) 75 MG CAPS Take 1 capsule (75 mg total) by mouth every 12 (twelve) hours.  60 capsule  11  . escitalopram (LEXAPRO) 20 MG tablet Take 20 mg by mouth daily.      . furosemide (LASIX) 20 MG tablet Take 2 tablets by mouth two times daily and as needed for weight gain of 2 or more pounds in a day  135 tablet  3  . lithium carbonate (LITHOBID) 300 MG CR tablet Take 300 mg by mouth daily.      . metoprolol succinate (TOPROL-XL) 25 MG 24 hr tablet Take 25 mg by mouth 2 (two) times daily.      Marland Kitchen OLANZapine (ZYPREXA) 2.5 MG tablet Take 1/2 tablet by mouth at bedtime      . polyethylene glycol (MIRALAX / GLYCOLAX) packet Take 17 g by mouth 2 (two) times daily.        . potassium chloride SA (K-DUR,KLOR-CON) 20 MEQ tablet  Take 1 tablet (20 mEq total) by mouth 2 (two) times daily.  60 tablet  11  . PROAIR HFA 108 (90 BASE) MCG/ACT inhaler INHALE TWO PUFFS EVERY 6 HOURS AS NEEDED  9 g  2  . simvastatin (ZOCOR) 20 MG tablet TAKE ONE TABLET BY MOUTH AT BEDTIME  30 tablet  5  . SYNTHROID 125 MCG tablet Take 1 tablet (125 mcg total) by mouth daily.  30 each  5    Physical Exam: Filed Vitals:   11/02/12 1445  BP: 108/70  Pulse: 103  Height: 5\' 8"  (1.727 m)  Weight: 208 lb 6.4 oz (94.53 kg)    GEN- The patient is well appearing, alert and oriented x 3 today.   Head- normocephalic, atraumatic Eyes-  Sclera clear, conjunctiva pink Ears- hearing intact Oropharynx- clear Lungs- Clear to ausculation bilaterally, normal work of breathing Heart- irregular rate and rhythm, no murmurs, rubs or gallops, PMI not laterally displaced GI- soft, NT, ND, + BS Extremities- no clubbing, cyanosis, or edema  ekg today reveals typical appearing atrial flutter, V  Rate 75-100 bpm, nonspecific ST/T changes  Assessment and Plan:   Atrial fibrillation  Stable and maintaining sinus  Continue pradaxa  Chronic diastolic heart failure   euvolemic today  No  changes Continue daily weights and increase lasix prn 2g sodium restriction  Bradycardia  Stable No indication for pacing at this time

## 2012-11-02 NOTE — Assessment & Plan Note (Signed)
She has both afib and atrial flutter.  She is presently rate controlled and asymptomatic Continue pradaxa Check BMET and CBC today  No changes at this time

## 2012-12-01 ENCOUNTER — Telehealth: Payer: Self-pay | Admitting: Internal Medicine

## 2012-12-01 NOTE — Telephone Encounter (Signed)
Mon ---206.8 Tues--209.6 she had taken the additional Furosemide last night as well as the 2 nights prior, she is asymptomatic at present.  Her daughter states her HR is ranging from 67-90 .  She is questioning what to do about the fluid and trying to keep this down and her out of the hospital.

## 2012-12-01 NOTE — Telephone Encounter (Signed)
Per daughter call pt has taken and extra lasix for the last 3 days in a row and she is up 4lbs and climbing and daughter wants to know what to do to prevent mom from going in hospital again

## 2012-12-02 NOTE — Telephone Encounter (Signed)
Spoke with daughter, she has not talked with her Mom this morning.  She is going to call her and call me back to let me know how her weight is trending.  She is goiong to call back and leave me a message after talking with her Mom

## 2012-12-02 NOTE — Telephone Encounter (Signed)
Lost a pound last night and her weight is down 208, she will take an extra dose tonight again in hopes of getting back down to 206. She will continue with her current medications as prescribed and call me back if her weight does not trend down

## 2012-12-11 ENCOUNTER — Other Ambulatory Visit (INDEPENDENT_AMBULATORY_CARE_PROVIDER_SITE_OTHER): Payer: Medicare Other

## 2012-12-11 ENCOUNTER — Other Ambulatory Visit: Payer: Self-pay | Admitting: Pulmonary Disease

## 2012-12-11 DIAGNOSIS — Z79899 Other long term (current) drug therapy: Secondary | ICD-10-CM

## 2012-12-11 DIAGNOSIS — F332 Major depressive disorder, recurrent severe without psychotic features: Secondary | ICD-10-CM

## 2012-12-11 LAB — LIPID PANEL: Cholesterol: 151 mg/dL (ref 0–200)

## 2012-12-11 LAB — URINALYSIS
Bilirubin Urine: NEGATIVE
Hgb urine dipstick: NEGATIVE
Leukocytes, UA: NEGATIVE
Nitrite: NEGATIVE
pH: 7 (ref 5.0–8.0)

## 2012-12-11 LAB — TSH: TSH: 0.96 u[IU]/mL (ref 0.35–5.50)

## 2012-12-23 ENCOUNTER — Other Ambulatory Visit: Payer: Self-pay | Admitting: Cardiology

## 2012-12-23 MED ORDER — METOPROLOL SUCCINATE ER 25 MG PO TB24: 25.0000 mg | ORAL_TABLET | Freq: Two times a day (BID) | ORAL | Status: DC

## 2013-01-06 ENCOUNTER — Other Ambulatory Visit: Payer: Self-pay | Admitting: Pulmonary Disease

## 2013-02-05 IMAGING — CT CT ANGIO CHEST
1 of 2 series · 19 of 32 positions shown · IV contrast (APPLIED)
Comparison: Chest radiograph 11/17/2011

CLINICAL DATA: Shortness of breath and wheezing

CT ANGIOGRAPHY CHEST
TECHNIQUE: Multidetector CT imaging of the chest using the
standard protocol during bolus administration of intravenous
contrast. Multiplanar reconstructed images including MIPs were
obtained and reviewed to evaluate the vascular anatomy.
Contrast: 80mL OMNIPAQUE IOHEXOL 300 MG/ML IV SOLN

[Series 6: thins for pacs · axial · 0.59mm/px · z∈[-338,-96]mm · 19 of 266 slices shown]
[im 12/266  lung]
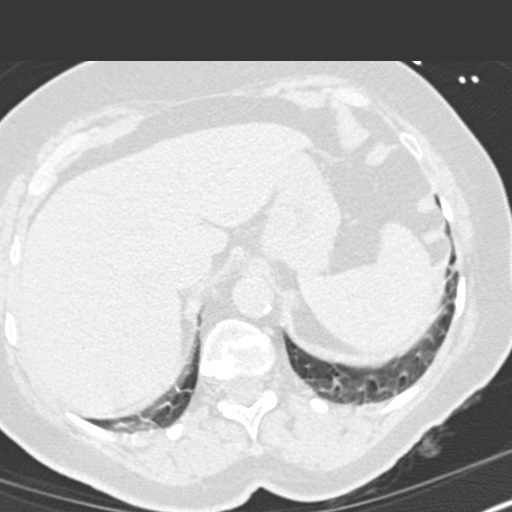
[im 24/266  soft-tissue]
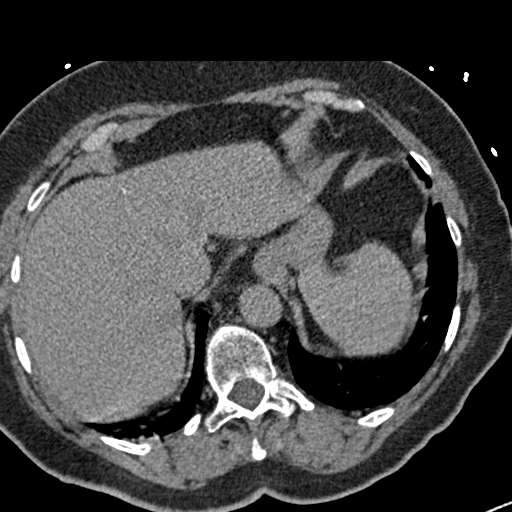
[im 35/266  lung]
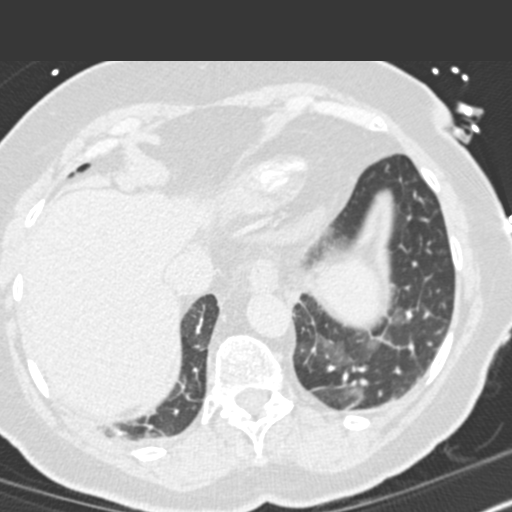
[im 58/266  soft-tissue]
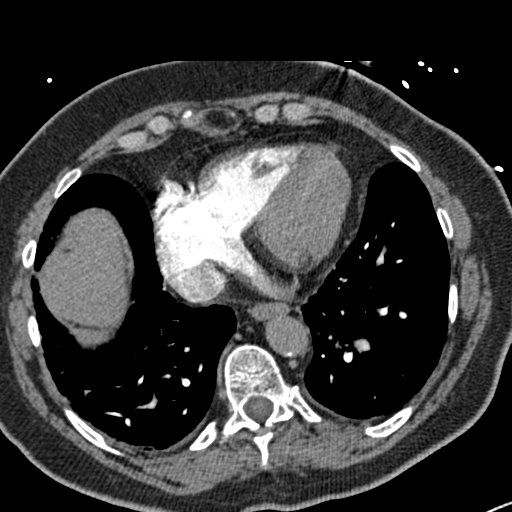
[im 70/266  lung]
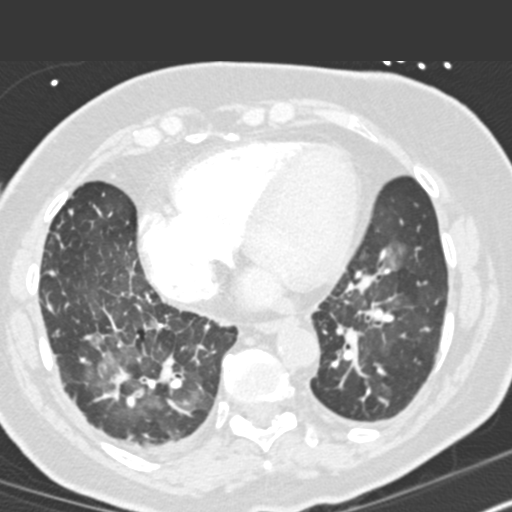
[im 81/266  soft-tissue]
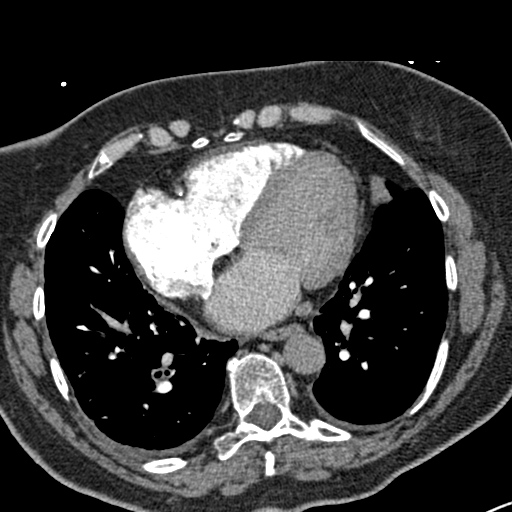
[im 93/266  lung]
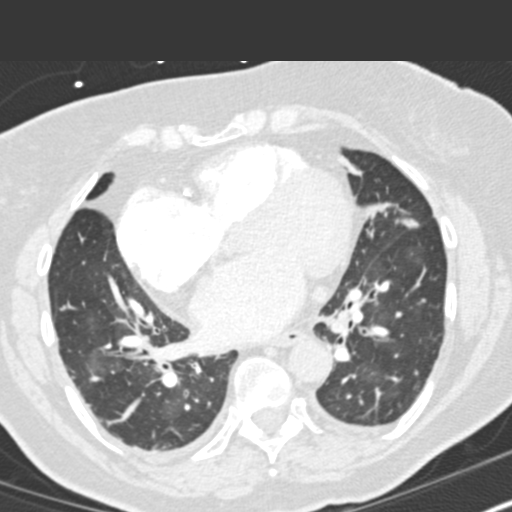
[im 104/266  soft-tissue]
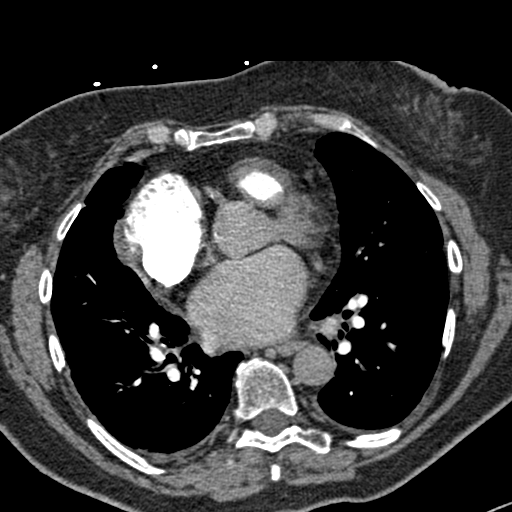
[im 116/266  lung]
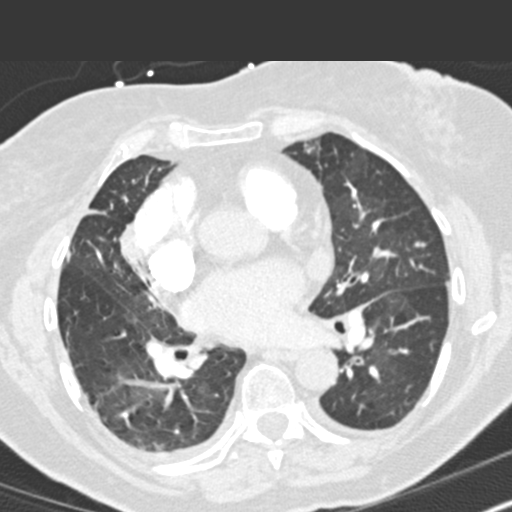
[im 139/266  soft-tissue]
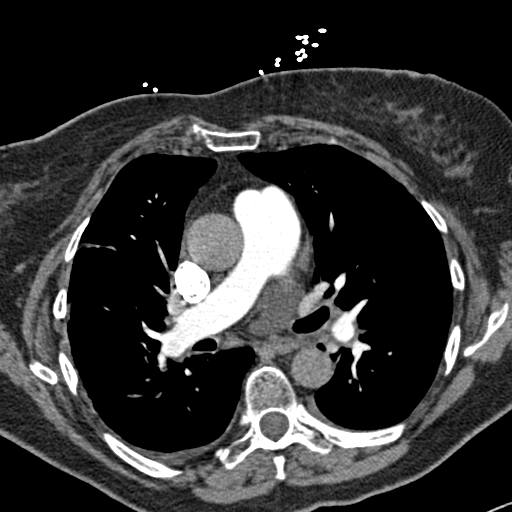
[im 150/266  lung]
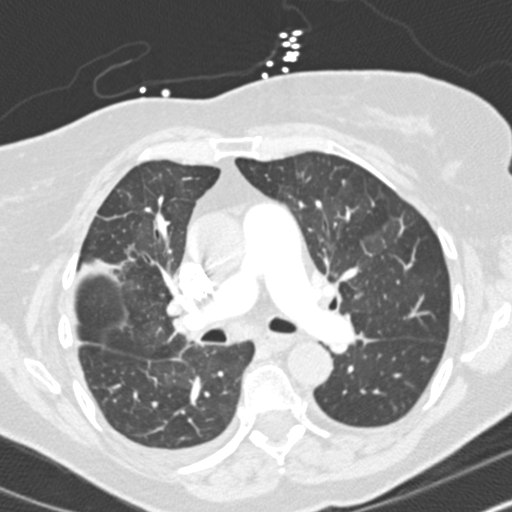
[im 162/266  soft-tissue]
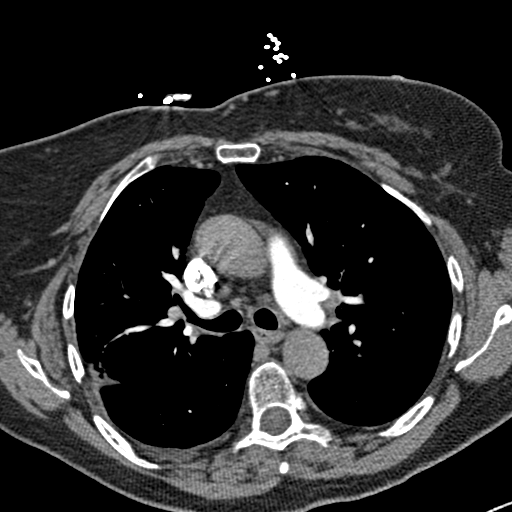
[im 173/266  lung]
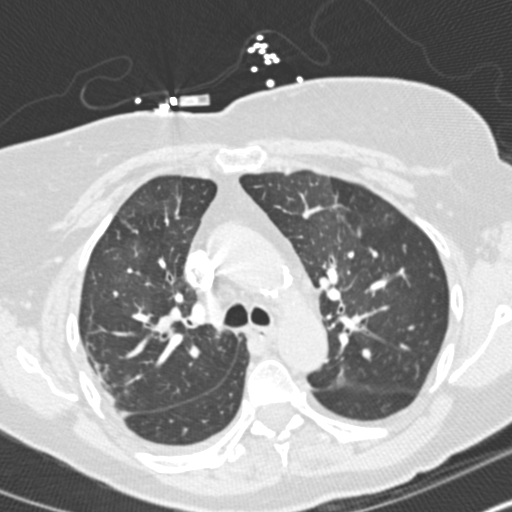
[im 185/266  soft-tissue]
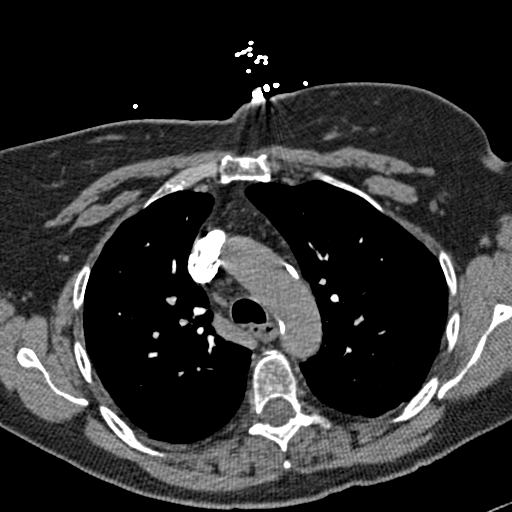
[im 196/266  lung]
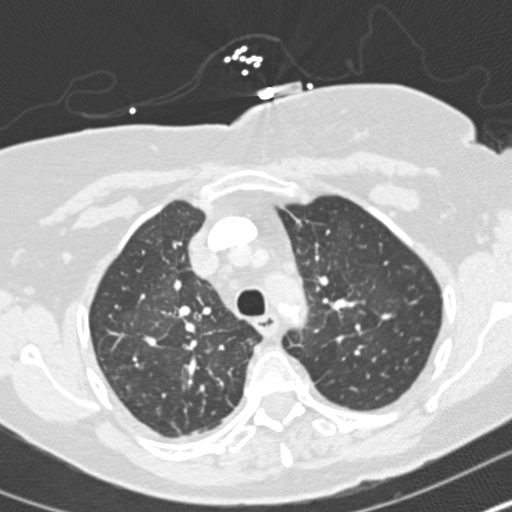
[im 208/266  soft-tissue]
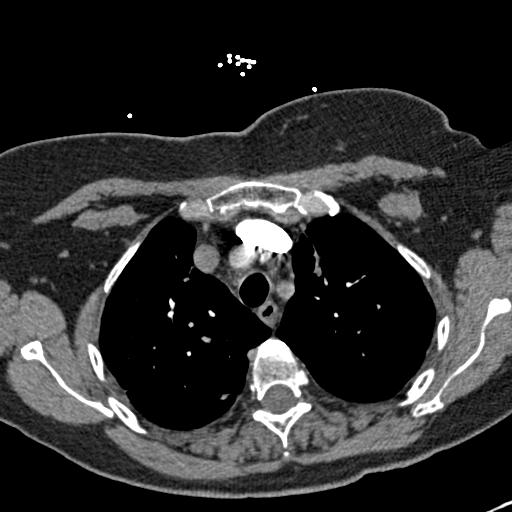
[im 231/266  lung]
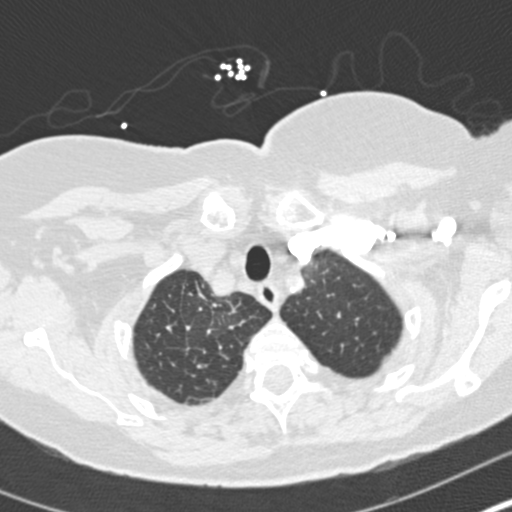
[im 242/266  soft-tissue]
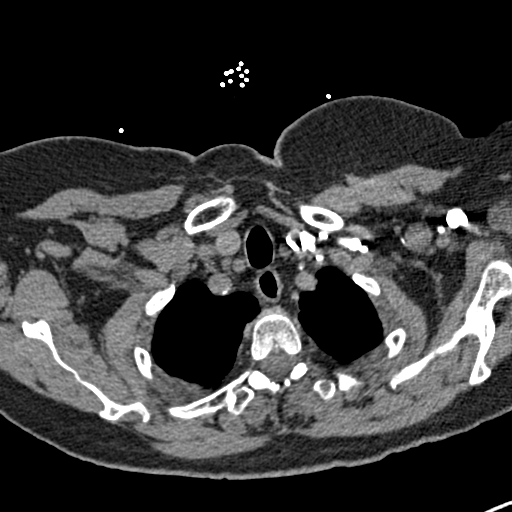
[im 254/266  lung]
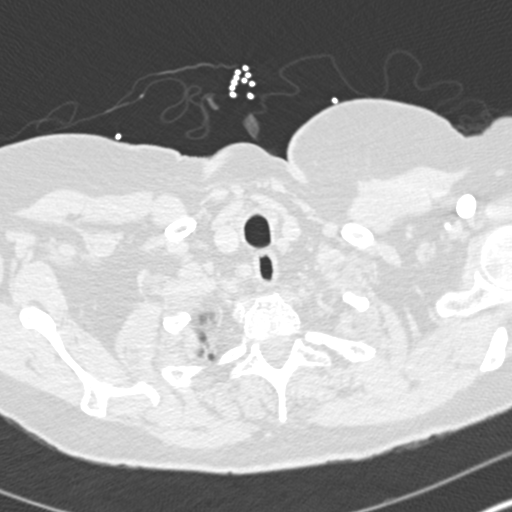

[19 of 32 positions shown; findings below may reference images not displayed]

FINDINGS: This is a technically satisfactory evaluation of the
pulmonary arteries.  No focal filling defects are identified in the
pulmonary arterial tree.  Central pulmonary arteries are normal in
size.  There is moderate cardiomegaly.  Several mediastinal lymph
nodes are present, but not pathologically enlarged. There is
nonpathologically enlarged hilar nodal tissue. No enlarged lymph
nodes. The thoracic aorta is normal in caliber and contains
scattered areas of atherosclerotic calcification.  No significant
contrast is seen within the aorta at the time of the examination.

There is a very small right, and a trace left pleural effusion.
Negative for pericardial effusion.

Slight intralobular septal thickening in the lung apices, and more
prominent septal thickening at the lung bases.  There are scattered
ground-glass opacities bilaterally.  Findings are consistent with
mild pulmonary edema.  There is no consolidation.

No acute bony abnormality.
IMPRESSION: 1. CHF.  Cardiomegaly, mild pulmonary edema, and very small right
and trace left pleural effusions.
2.  Negative for pulmonary embolism.

## 2013-02-05 IMAGING — CR DG CHEST 1V PORT
1 series · 1 of 1 positions shown · non-contrast
Comparison: Chest radiograph performed 12/03/2010

CLINICAL DATA: Shortness of breath; atrial fibrillation.

PORTABLE CHEST - 1 VIEW

[AP]
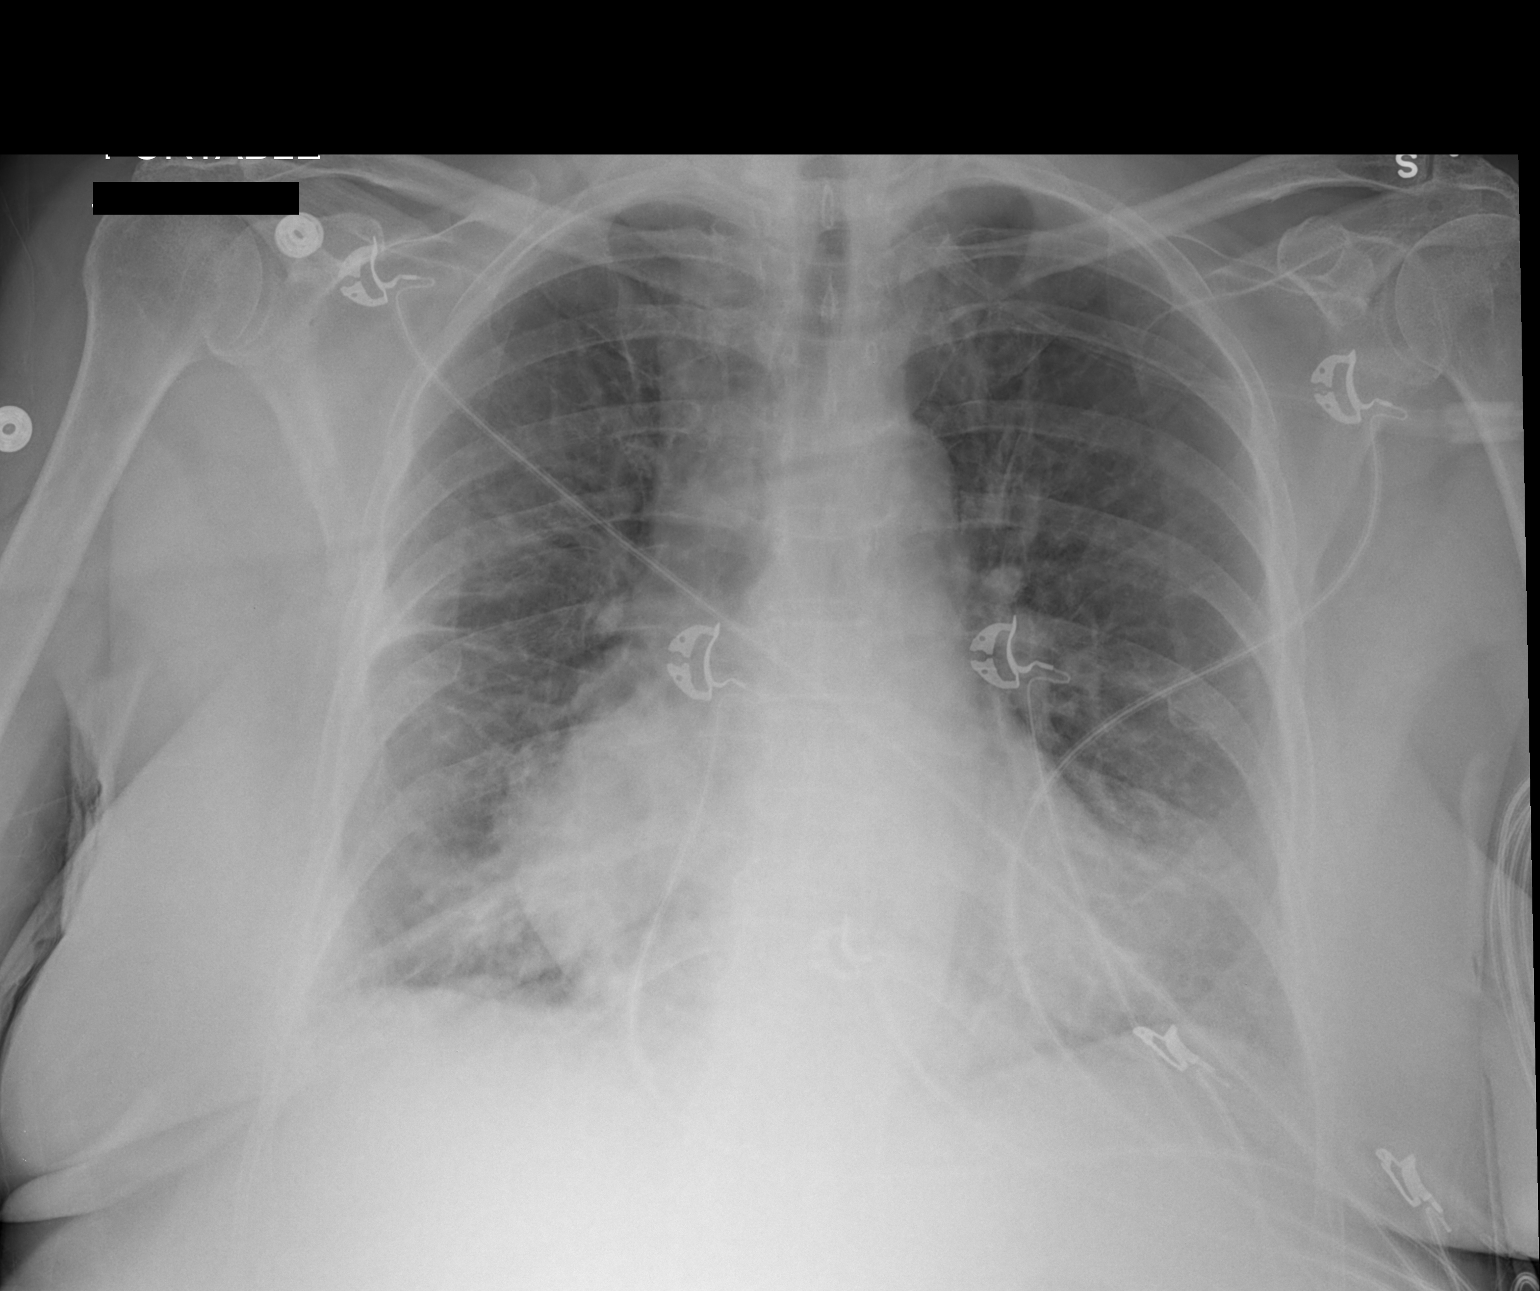

[1 of 1 positions shown; findings below may reference images not displayed]

FINDINGS: The lungs are well-aerated.  Mild bibasilar airspace
opacities raise question for minimal interstitial edema, given
underlying vascular congestion.  Trace fluid or atelectasis is
noted along the right minor fissure.  There is no evidence of
pneumothorax.

The cardiomediastinal silhouette is mildly enlarged.  No acute
osseous abnormalities are seen.
IMPRESSION: Mild bibasilar airspace opacities raise question for minimal
interstitial edema, given underlying vascular congestion and mild
cardiomegaly.  Trace fluid or atelectasis along the right minor
fissure.

## 2013-02-10 ENCOUNTER — Other Ambulatory Visit: Payer: Self-pay | Admitting: Pulmonary Disease

## 2013-03-05 ENCOUNTER — Encounter: Payer: Self-pay | Admitting: Pulmonary Disease

## 2013-03-05 ENCOUNTER — Other Ambulatory Visit (INDEPENDENT_AMBULATORY_CARE_PROVIDER_SITE_OTHER): Payer: Medicare Other

## 2013-03-05 ENCOUNTER — Ambulatory Visit (INDEPENDENT_AMBULATORY_CARE_PROVIDER_SITE_OTHER): Payer: Medicare Other | Admitting: Pulmonary Disease

## 2013-03-05 VITALS — BP 126/74 | HR 80 | Temp 98.4°F | Ht 66.5 in | Wt 208.2 lb

## 2013-03-05 DIAGNOSIS — K5730 Diverticulosis of large intestine without perforation or abscess without bleeding: Secondary | ICD-10-CM

## 2013-03-05 DIAGNOSIS — I5032 Chronic diastolic (congestive) heart failure: Secondary | ICD-10-CM

## 2013-03-05 DIAGNOSIS — I4891 Unspecified atrial fibrillation: Secondary | ICD-10-CM

## 2013-03-05 DIAGNOSIS — F329 Major depressive disorder, single episode, unspecified: Secondary | ICD-10-CM

## 2013-03-05 DIAGNOSIS — E663 Overweight: Secondary | ICD-10-CM

## 2013-03-05 DIAGNOSIS — M899 Disorder of bone, unspecified: Secondary | ICD-10-CM

## 2013-03-05 DIAGNOSIS — D126 Benign neoplasm of colon, unspecified: Secondary | ICD-10-CM

## 2013-03-05 DIAGNOSIS — I872 Venous insufficiency (chronic) (peripheral): Secondary | ICD-10-CM

## 2013-03-05 DIAGNOSIS — N289 Disorder of kidney and ureter, unspecified: Secondary | ICD-10-CM

## 2013-03-05 DIAGNOSIS — E785 Hyperlipidemia, unspecified: Secondary | ICD-10-CM

## 2013-03-05 DIAGNOSIS — I679 Cerebrovascular disease, unspecified: Secondary | ICD-10-CM

## 2013-03-05 DIAGNOSIS — E039 Hypothyroidism, unspecified: Secondary | ICD-10-CM

## 2013-03-05 DIAGNOSIS — M1990 Unspecified osteoarthritis, unspecified site: Secondary | ICD-10-CM

## 2013-03-05 LAB — BASIC METABOLIC PANEL WITH GFR
BUN: 18 mg/dL (ref 6–23)
CO2: 31 meq/L (ref 19–32)
Calcium: 9.5 mg/dL (ref 8.4–10.5)
Chloride: 104 meq/L (ref 96–112)
Creatinine, Ser: 1.5 mg/dL — ABNORMAL HIGH (ref 0.4–1.2)
GFR: 34.96 mL/min — ABNORMAL LOW
Glucose, Bld: 86 mg/dL (ref 70–99)
Potassium: 4 meq/L (ref 3.5–5.1)
Sodium: 141 meq/L (ref 135–145)

## 2013-03-05 NOTE — Patient Instructions (Addendum)
Today we updated your med list in our EPIC system...    Continue your current medications the same...  Today we did your follow up metabolic panel to recheck your renal function...    We will contact you w/ the results when available...   Stay as active as possible...  Call for any questions...  Let's plan a follow up visit in 40mo, sooner if needed for problems.Marland KitchenMarland Kitchen

## 2013-03-06 ENCOUNTER — Encounter: Payer: Self-pay | Admitting: Pulmonary Disease

## 2013-03-06 NOTE — Progress Notes (Signed)
Subjective:    Patient ID: Sonya Kaufman, female    DOB: 1931/07/23, 79 y.o.   MRN: [id number]  HPI 79 y/o WF here for a follow up visit... she has mult med problems as noted below... she is followed by DrSteiner for Psychiatry w/ severe depression on 5 diff meds as below...  ~  Feb 08, 2011:  77mo ROV & post hosp visit> she was hosp 2/12 by [facility] w/ RLL Pneum & CHF, then had to be readmit 3/12 by [facility] w/ acute on chronic diastolic CHF w/ recurrent PAF;  She had f/u DrAllred  3/12 w/ meds adjusted- SEE BELOW...  Thyroid medication had to be adjusted & currently on Synthroid 135mcg/d & doing satis...  ~  August 16, 2011:  77mo ROV & her CC is nasal congestion, otherw feels she is doing satis, mood sl improved as well on meds from DrSteiner; she had f/u DrAllred 10/12 & was essent asymptomatic w/o CP, palpit, ch in SOB, edema etc; EKG in AFlutter on rate control & Pradaxa, CHF controlled w/ Lasix, Labs were checked> BUN=17, Creat=1.5, TSH=0.93, Lithium level= 0.62 (0.80-1.40)... She declines further labs today...  She already had the 2012 Flu vaccine, and review of record indicates that she is due a TDAP today- done...  See prob list below>>  ~  Feb 27, 2012:  77mo ROV & review> [place] is doing quite well now according to her hx & confirmed by daughter; she was hosp 2/13 w/ acute on chr diastolic CHF & her Lithium level was too high- she was diuresed & Lithium adjusted & she is much improved (their notes are reviewed); meds adjusted by [facility]- DrAllred (AFib on Pradaxa & Metoprolol and hasn't required pacer)    We reviewed prob list, meds, xrays and labs> see below>> LABS 6/13 ==> FLP looks good on Prav40;  Chems- ok w/ stable Creat=1.5, and normal LFTs...   ~  September 04, 2012:  77mo ROV & Lillyonna has had a good 5mo interval- no new complaints or concerns, & confirmed by her daughter who brought her to the office today... We reviewed these following problems during today's office visit:     AB> on Proair prn;  breathing is good, no exac & denies cough, phlegm, hemoptysis, SOB, etc; she is too sedentary & needs to incr exercise program...    AFib/Flutter, chr diastolic CHF> on [id number], MetopER25Bid, Lasix20-2Bid, K20Bid; BP=132/68 & she denies CP, palpit, ch in DOE, edema; she saw DrAllred 9/13- stable, no changes made...    Cerebrovasc dis> she has sm vessel dis on old [place]; denies cerebral ischemic symptoms...    VI> she knows to elim sodium, elevate legs, wear support hose, & continue the Lasix...    Chol> on Simva20; last FLP 6/13 showed TChol 141, TG 103, HDL 52, LDL 69    Hypothy> on Synthroid125/d & last TSH was checked 2/13 = 1.16    Overweight> wt= 208# which is up 3# over the last 90mo; we reviewed diet, exercise, & wt reduction strategies...    GI- Divertics, Polyps> last colonoscopy by DrStark was 10/10 w/ several sm polyps removed- one tubular adenoma & f/u planned 2015; she denies abd pain, n/v, c/d, blood seen...    DJD, Osteopenia> she uses OTC analgesics, calcium, MVI, VitD supplements...    Depression> followed by DrSteiner for Psychiatry on  Lethium300, Wellbutrin300, Lexapro20, Zyprexa2.5-1/2tab; she has been stable on these meds & doing well overall...  We reviewed prob list, meds, xrays and labs> see below  for updates >>  LABS 12/13:  BMet is stable w/ Creat=1.5.Marland KitchenMarland Kitchen   ~  March 05, 2013:  69mo ROV & Lianna is doing well- no new complaints or concerns; she's had f/u appts w/ DrAllred & the pacer clinic- doing well, holding NSR, stable on Pradaxa, no changes made; We reviewed the following medical problems during today's office visit >>     AB> on Proair prn; breathing is good, no exac & denies cough, phlegm, hemoptysis, SOB, etc; she is too sedentary & needs to incr exercise program...    AFib/Flutter, chr diastolic CHF> on [id number], MetopER25Bid, Lasix20-2Bid, K20Bid; BP=126/74 & she denies CP, palpit, ch in DOE, edema; she saw DrAllred 2/14- stable, no changes made...     Cerebrovasc dis> she has sm vessel dis on old [place]; denies cerebral ischemic symptoms...    VI> she knows to elim sodium, elevate legs, wear support hose, & continue the Lasix...    Chol> on Simva20; last FLP 3/14 showed TChol 151, TG 112, HDL 48, LDL 80    Hypothy> on Synthroid125/d & last TSH was checked 3/14 = 0.96    Overweight> wt= 208# which is stable over the last 10mo; we reviewed diet, exercise, & wt reduction strategies...    GI- Divertics, Polyps> last colonoscopy by DrStark was 10/10 w/ several sm polyps removed- one tubular adenoma & f/u planned 2015; she denies abd pain, n/v, c/d, blood seen...    Renal Insuffic> on Lasix20-2Bid her Creat was up to 1.7 but has returned to baseline 1.5 now; continue same meds...    DJD, Osteopenia> she uses OTC analgesics, calcium, MVI, VitD supplements...    Depression> hx severe depression; followed by DrSteiner for Psychiatry on  Lithium300, Wellbutrin300, Lexapro20, Zyprexa2.5-1/2tab; she has been stable on these meds & doing well overall...  We reviewed prob list, meds, xrays and labs> see below for updates >>  LABS 2/14-3/14:  FLP- at goals on Simva20;  Chems- ok x Cr=1.7;  CBC- wnl;  TSH=0.96...  LABS 6/14:  Chems- ok w/ Creat=1.5.Marland KitchenMarland Kitchen          Problem List:  ASTHMATIC BRONCHITIS, ACUTE (ICD-466.0) - on PROAIR as needed; she is a non-smoker> denies cough, sputum, hemoptysis, worsening dyspnea,  wheezing, chest pains, snoring, daytime hypersomnolence, etc...  ~  CXR 11/11 showed cardiomeg, clear lungs, NAD.Marland Kitchen. ~  CXR 3/12 showed cardiomeg, vasc congestion, mild interst edema> c/w mild CHF... ~  CXR 2/13 showed mild cardiomegaly, mild vasc congestion, mild basilar atx [facility] for CHF). ~  CTAngio 2/13 showed no evid PE, cardiomeg, CHF/ mild pulm edema, sm effusions...  ACUTE ON CHRONIC DIASTOLIC HEART FAILURE> on METOPROLOL ER 50mg - 1.5tabsBid, LASIX 20mg - 2Bid, KCl 65mEq Bid,and PRADAXA 75mg  bid... ATRIAL FIBRILLATION & ATRIAL FLUTTER - new  onset noted 11/11 OV & pt asymptomatic w/o CP, palpit, dizzy, SOB, etc> referred to DrAllred. ~  She has had freq med adjustments in the interval by [facility]/ DrAllred> on & off Cardizem, up & down on Pradaxa Loss adjuster, chartered & Epic reviewed). ~  5/12:  They report current meds: METOPROLOL 25mg - 2Bid, LASIX 20mg - 2AM & 1PM, KCl 31mEq Bid,and PRADAXA 150mg  bid. ~  11/12:  She had recent check up w/ DrAllred & meds now listed> METOPROLOL ER 50mg Bid, LASIX 20mg - 2AM & 1PM, KCl 38mEq Bid,and PRADAXA 75mg  bid. ~  2/13:  Hosp for acute on chronic diastolic CHF & AFib- managed by DrAllred & meds adjusted as noted above; office f/u 4/13 & stable... ~  12/13: on Pradaxa75Bid, MetopER25Bid,  Lasix20-2Bid, K20Bid; BP=132/68 & she denies CP, palpit, ch in DOE, edema; f/u DrAllred 9/13- stable, no changes. ~  6/14: on Pradaxa75Bid, MetopER25Bid, Lasix20-2Bid, K20Bid; BP=126/74 & she denies CP, palpit, ch in DOE, edema; she saw DrAllred 2/14- stable, no changes made.  CEREBROVASCULAR DISEASE (ICD-437.9) - on PRADAXA 75mg  Bid...  ~  prev eval by DrLove in 2003... CT showed bilat sm vessel ischemic disease... prev on ASA 81mg /d... She denies any cerebral ischemic symptoms...  VENOUS INSUFFICIENCY (ICD-459.81) - she takes LASIX as above & follows a low sodium diet and uses support hose as needed...  HYPERLIPIDEMIA (ICD-272.4) - on SIMVASTATIN 20mg /d + FishOil 1000mg /d... ~  [place] 11/07 showed TChol 140, Tg 90, HDL 52, LDL 70... ~  [place] 5/09 showed TChol 181, TG 105, HDL 51, LDL 110... rec- same med, better diet for now. ~  FLP 9/10 showed TChol 174, TG 129, HDL 63, LDL 86 ~  She needs to return FASTING for FLP recheck... ~  [place] 3/12 showed TChol 121, TG 58, HDL 57, LDL 52... Note LFTs sl elev w/ SGOT 67, SGPT 74... ~  [place] 6/13 on Simva20 showed TChol 141, TG 103, HDL 52, LDL 69... Note LFTs are wnl now... ~  [place] 3/14 on simva20 showed TChol 151, TG 112, HDL 48, LDL 80   HYPOTHYROIDISM (ICD-244.9) - currently on SYNTHROID  167mcg/d... ~  prev TSH 7/08 was 3.56 ~  labs 5/09 showed TSH= 2.06 ~  labs 9/10 showed TSH= 2.30 ~  labs 3/11 showed TSH= 3.13, FreeT3= 2.4 (2.3-4.2), FreeT4= 0.9 (0.6-1.6)... rec incr to 177mcg/d. ~  labs 11/11 on Levoth125 showed TSH= 0.92... keep same for now. ~  During the 2/12 hosp she was cut back to 169mcg/d dose & she says she feels better on this dose. ~  Labs 3/12 showed TSH= 5.80 & reminded to take med every day... ~  Labs 10/12 showed TSH= 0.62... We have dose listed as 161mcg/d. ~  Labs 2/13 in [place] showed TSH= 1.16 on Synthroid192mcg/d now... ~  Labs 3/14 on Synthroid125 showed TSH= 0.96  OVERWEIGHT (ICD-278.02) - diet + exercise program discussed... ~  weight 5/09 = 206# ~  weight 9/10 = 206# ~  weight 3/11 = 213# ~  weight 11/11 = 209# ~  Weight 5/12 = 206# ~  Weight 11/12 = 207# ~  Weight 5/13 = 205# ~  Weight 12/13 = 208# ~  Weight 6/14 = 208#  DIVERTICULOSIS OF COLON (ICD-562.10) COLONIC POLYPS (ICD-211.3) - she had routine colonoscopy 10/10 by DrStark showing several 4-96mm polyps & mild divertics- path showed one tubular adenoma & f/u planned 42yrs.  OSTEOARTHRITIS (ICD-715.90) - hx knee pain in past, now c/o intermiitent right hip pain... she never filled Mobic Rx & uses OTC pain meds Prn.  OSTEOPENIA (ICD-733.90) - I can't find prev BMD in the system... takes Calcium, MVI, Vit D... ~  labs 5/09 showed Vit D level = 31... rec- 1000 u Vit D OTC supplement.  DEPRESSION (ICD-311) - This has been her major problem over the years... several psyche hospitalizations and one OD admit... followed by DrSteiner every 2-3 months, on several meds & adjusted frequently... Currently on WELLBUTRIN XL 300,  LEXAPRO 20,  ZYPREXA 2.5 (taking 0.5 tabs daily), &  LITHOBID 300. ~  9/10:  she reports doing very well on her regimen- stable w/o acute problems. ~  3/11:  reports incr depressive symptoms- meds adjusted & [place] added by DrSteiner. ~  11/11: reports stable on her 5  med  regimen... she & husb continue to care for their 56 y/o son w/ CP. ~  5/12: she is currently on 4 meds from DrSteiner, husb ill, kids having to care for them + handicaped brother... ~  11/12: current meds listed as> WELLBUTRIN XL 300,  LEXAPRO 20,  ZYPREXA 2.5 (taking 1.5 tabs daily), &  LITHOBID incr to 2 tabs daily... ~  5/13:  Her meds have been adjusted & daugh says much improved> on WELLBUTRIN XL 300,  LEXAPRO 20,  ZYPREXA 2.5, &  LITHOBID 300mg /d. ~  12/13: followed by DrSteiner for Psychiatry on  Lethium300, Wellbutrin300, Lexapro20, Zyprexa2.5-1/2tab; she has been stable on these meds & doing well overall...  ~  6/14: hx severe depression; followed by DrSteiner for Psychiatry on  Lithium300, Wellbutrin300, Lexapro20, Zyprexa2.5-1/2tab; she has been stable on these meds & doing well overall...  HEALTH MAINTENANCE: ~  GI:  Followed by DrStark w/ colon 2010 as above... ~  GYN:   ~  Immunizations:  She gets the yearly Flu vaccine;  She had Pneumovax prev;  Given TDAP 11/12 in office...   Past Surgical History  Procedure Laterality Date  . Tonsillectomy      Outpatient Encounter Prescriptions as of 03/05/2013  Medication Sig Dispense Refill  . buPROPion (WELLBUTRIN XL) 300 MG 24 hr tablet Take 300 mg by mouth daily.        . dabigatran (PRADAXA) 75 MG CAPS Take 1 capsule (75 mg total) by mouth every 12 (twelve) hours.  60 capsule  11  . escitalopram (LEXAPRO) 20 MG tablet Take 1/4 tablet by mouth daily      . furosemide (LASIX) 20 MG tablet Take 2 tablets by mouth two times daily and as needed for weight gain of 2 or more pounds in a day  135 tablet  3  . lithium carbonate (LITHOBID) 300 MG CR tablet Take 300 mg by mouth daily.      . metoprolol succinate (TOPROL-XL) 25 MG 24 hr tablet Take 1 tablet (25 mg total) by mouth 2 (two) times daily.  60 tablet  5  . OLANZapine (ZYPREXA) 2.5 MG tablet Take 1/2 tablet by mouth at bedtime      . polyethylene glycol (MIRALAX / GLYCOLAX) packet Take  17 g by mouth 2 (two) times daily.        . potassium chloride SA (K-DUR,KLOR-CON) 20 MEQ tablet Take 1 tablet (20 mEq total) by mouth 2 (two) times daily.  60 tablet  11  . PROAIR HFA 108 (90 BASE) MCG/ACT inhaler INHALE TWO PUFFS EVERY 6 HOURS AS NEEDED  9 g  2  . simvastatin (ZOCOR) 20 MG tablet TAKE ONE TABLET BY MOUTH AT BEDTIME  30 tablet  6  . SYNTHROID 125 MCG tablet Take 1 tablet (125 mcg total) by mouth daily.  30 each  5   No facility-administered encounter medications on file as of 03/05/2013.    No Known Allergies   Current Medications, Allergies, Past Medical History, Past Surgical History, Family History, and Social History were reviewed in [employer] record.    Review of Systems         See HPI - all other systems neg except as noted... The patient complains of SOB/ DOE, & depression.  The patient denies anorexia, fever, weight loss, weight gain, vision loss, decreased hearing, hoarseness, chest pain, syncope, peripheral edema, prolonged cough, headaches, hemoptysis, abdominal pain, melena, hematochezia, severe indigestion/heartburn, hematuria, incontinence, muscle weakness, suspicious skin lesions, transient  blindness, difficulty walking, unusual weight change, abnormal bleeding, enlarged lymph nodes, and angioedema.     Objective:   Physical Exam     WD, Overweight, 79 y/o WF in NAD... she has a flat affect... Vital Signs:  Reviewed... GENERAL:  Alert & oriented; pleasant & cooperative... HEENT:  [place]/AT, EOM-wnl, PERRLA, EACs-clear, TMs-wnl, NOSE-clear, THROAT-clear & wnl. NECK:  Supple w/ fairROM; no JVD; normal carotid impulses w/o bruits; no thyromegaly or nodules palpated; no lymphadenopathy. CHEST:  Clear to P & A; without wheezes/ rales/ or rhonchi heard... HEART:  Irreg rhythm w/ variable VR; without murmurs/ rubs/ or gallops detected... ABDOMEN:  Obese, soft & nontender; normal bowel sounds; no organomegaly or masses palpated... EXT:  without deformities, mild arthritic changes; no varicose veins/ +venous insuffic/ no edema. NEURO:  CN's intact;  no focal neuro deficits... DERM:  No lesions noted; no rash etc...  RADIOLOGY DATA:  Reviewed in the EPIC EMR & discussed w/ the patient...  LABORATORY DATA:  Reviewed in the EPIC EMR & discussed w/ the patient...   Assessment & Plan:    CARDIAC>  Followed by DrAllred- Diastolic CHF, AFib/Flutter, etc>  Stable on Pradaxa, Toprol, Lasix, KCl... Continue same...  LIPIDS>  She is tol Simva20 + FishOil very well> f/u FLP & LFTs all look good- continue same Rx...  HYPOTHY>  On Syntroid 133mcg/d now & stable clinically; continue same...  Overweight>  We discussed diet + exercise program...  GI>  Stable...  Renal>  Creat stable ~1.5 baseline on her Lasix dose...  ORTHO>  DJD, Osteopenia> she uses OTC analgesics prn but needs Calcium, MVI, Vit D supplementation...  DEPRESSION>  A major prob for her, she sees DrSteiner for meds, continue regular f/u w/ Psychiatry...   Patient's Medications  New Prescriptions   No medications on file  Previous Medications   BUPROPION (WELLBUTRIN XL) 300 MG 24 HR TABLET    Take 300 mg by mouth daily.     DABIGATRAN (PRADAXA) 75 MG CAPS    Take 1 capsule (75 mg total) by mouth every 12 (twelve) hours.   ESCITALOPRAM (LEXAPRO) 20 MG TABLET    Take 1/4 tablet by mouth daily   FUROSEMIDE (LASIX) 20 MG TABLET    Take 2 tablets by mouth two times daily and as needed for weight gain of 2 or more pounds in a day   LITHIUM CARBONATE (LITHOBID) 300 MG CR TABLET    Take 300 mg by mouth daily.   METOPROLOL SUCCINATE (TOPROL-XL) 25 MG 24 HR TABLET    Take 1 tablet (25 mg total) by mouth 2 (two) times daily.   OLANZAPINE (ZYPREXA) 2.5 MG TABLET    Take 1/2 tablet by mouth at bedtime   POLYETHYLENE GLYCOL (MIRALAX / GLYCOLAX) PACKET    Take 17 g by mouth 2 (two) times daily.     POTASSIUM CHLORIDE SA (K-DUR,KLOR-CON) 20 MEQ TABLET    Take 1 tablet (20 mEq  total) by mouth 2 (two) times daily.   PROAIR HFA 108 (90 BASE) MCG/ACT INHALER    INHALE TWO PUFFS EVERY 6 HOURS AS NEEDED   SIMVASTATIN (ZOCOR) 20 MG TABLET    TAKE ONE TABLET BY MOUTH AT BEDTIME   SYNTHROID 125 MCG TABLET    Take 1 tablet (125 mcg total) by mouth daily.  Modified Medications   No medications on file  Discontinued Medications   No medications on file

## 2013-03-08 ENCOUNTER — Ambulatory Visit: Payer: Medicare Other | Admitting: Internal Medicine

## 2013-03-18 ENCOUNTER — Ambulatory Visit (INDEPENDENT_AMBULATORY_CARE_PROVIDER_SITE_OTHER): Payer: Medicare Other | Admitting: Internal Medicine

## 2013-03-18 ENCOUNTER — Encounter: Payer: Self-pay | Admitting: Internal Medicine

## 2013-03-18 VITALS — BP 126/68 | HR 80 | Ht 67.0 in | Wt 208.0 lb

## 2013-03-18 DIAGNOSIS — R0602 Shortness of breath: Secondary | ICD-10-CM

## 2013-03-18 DIAGNOSIS — I5032 Chronic diastolic (congestive) heart failure: Secondary | ICD-10-CM

## 2013-03-18 DIAGNOSIS — I4891 Unspecified atrial fibrillation: Secondary | ICD-10-CM

## 2013-03-18 DIAGNOSIS — R001 Bradycardia, unspecified: Secondary | ICD-10-CM

## 2013-03-18 DIAGNOSIS — I498 Other specified cardiac arrhythmias: Secondary | ICD-10-CM

## 2013-03-18 MED ORDER — FUROSEMIDE 20 MG PO TABS: ORAL_TABLET | ORAL | Status: DC

## 2013-03-18 NOTE — Patient Instructions (Addendum)
Your physician wants you to follow-up in: 6 months with Dr Rayann Heman.  You will receive a reminder letter in the mail two months in advance. If you don't receive a letter, please call our office to schedule the follow-up appointment.  Decrease Lasix to 40mg  once daily.  Weigh yourself daily.

## 2013-03-18 NOTE — Progress Notes (Signed)
PCP: Noralee Space, MD  Sonya Kaufman is a 79 y.o. female who presents today for routine cardiology followup.  Since last being seen in our clinic, the patient reports doing very well.  Today, she denies symptoms of palpitations, chest pain, shortness of breath,  lower extremity edema, dizziness, presyncope, or syncope.  The patient is otherwise without complaint today.   Past Medical History  Diagnosis Date  . Diastolic CHF, chronic   . Other and unspecified hyperlipidemia     EF 60-65% by echo 11/2011  . Unspecified hypothyroidism   . Overweight(278.02)   . Diverticulosis of colon (without mention of hemorrhage)   . Benign neoplasm of colon   . Osteoarthrosis, unspecified whether generalized or localized, unspecified site   . Disorder of bone and cartilage, unspecified   . Depressive disorder, not elsewhere classified   . Venous insufficiency   . Renal insufficiency   . Paroxysmal atrial fibrillation   . Atrial flutter   . Tachycardia-bradycardia    Past Surgical History  Procedure Laterality Date  . Tonsillectomy      Current Outpatient Prescriptions  Medication Sig Dispense Refill  . buPROPion (WELLBUTRIN XL) 300 MG 24 hr tablet Take 300 mg by mouth daily.        . dabigatran (PRADAXA) 75 MG CAPS Take 1 capsule (75 mg total) by mouth every 12 (twelve) hours.  60 capsule  11  . escitalopram (LEXAPRO) 20 MG tablet Take 1/4 tablet by mouth daily      . furosemide (LASIX) 20 MG tablet Take 2 tablets by mouth daily and as needed for weight gain of 2 or more pounds in a day  60 tablet  3  . lithium carbonate (LITHOBID) 300 MG CR tablet Take 300 mg by mouth daily.      . metoprolol succinate (TOPROL-XL) 25 MG 24 hr tablet Take 1 tablet (25 mg total) by mouth 2 (two) times daily.  60 tablet  5  . OLANZapine (ZYPREXA) 2.5 MG tablet Take 1/2 tablet by mouth at bedtime      . polyethylene glycol (MIRALAX / GLYCOLAX) packet Take 17 g by mouth 2 (two) times daily.        . potassium  chloride SA (K-DUR,KLOR-CON) 20 MEQ tablet Take 1 tablet (20 mEq total) by mouth 2 (two) times daily.  60 tablet  11  . PROAIR HFA 108 (90 BASE) MCG/ACT inhaler INHALE TWO PUFFS EVERY 6 HOURS AS NEEDED  9 g  2  . simvastatin (ZOCOR) 20 MG tablet TAKE ONE TABLET BY MOUTH AT BEDTIME  30 tablet  6  . SYNTHROID 125 MCG tablet Take 1 tablet (125 mcg total) by mouth daily.  30 each  5   No current facility-administered medications for this visit.    Physical Exam: Filed Vitals:   03/18/13 1425  BP: 126/68  Pulse: 80  Height: 5\' 7"  (1.702 m)  Weight: 208 lb (94.348 kg)    GEN- The patient is well appearing, alert and oriented x 3 today.   Head- normocephalic, atraumatic Eyes-  Sclera clear, conjunctiva pink Ears- hearing intact Oropharynx- clear Lungs- Clear to ausculation bilaterally, normal work of breathing Heart- irregular rate and rhythm, no murmurs, rubs or gallops, PMI not laterally displaced GI- soft, NT, ND, + BS Extremities- no clubbing, cyanosis, or edema  Assessment and Plan:   Atrial fibrillation  Stable and maintaining sinus  Continue pradaxa  Chronic diastolic heart failure   euvolemic today  Decrease lasix to 40mg  daily (presently  taking 40mg  BID) Continue daily weights and increase lasix prn 2g sodium restriction  Bradycardia  Improved and asymptomatic  Return in 6 months

## 2013-03-24 ENCOUNTER — Other Ambulatory Visit: Payer: Self-pay | Admitting: Pulmonary Disease

## 2013-04-05 ENCOUNTER — Telehealth: Payer: Self-pay | Admitting: Pulmonary Disease

## 2013-04-05 DIAGNOSIS — R300 Dysuria: Secondary | ICD-10-CM

## 2013-04-05 MED ORDER — CIPROFLOXACIN HCL 250 MG PO TABS: 250.0000 mg | ORAL_TABLET | Freq: Two times a day (BID) | ORAL | Status: DC

## 2013-04-05 NOTE — Telephone Encounter (Signed)
Spoke with pt's daughter and given Dr Jeannine Kitten instructions.  Order placed and rx sent.

## 2013-04-05 NOTE — Telephone Encounter (Signed)
Called, spoke with Shirlean Mylar, pt's daughter. Pt started with urinary frequency and dysuria a few days ago. Yesterday pt felt "like she was on fire," so Shirlean Mylar started pt on pyridium. (Called on call dr yesterday 4x's with no return call). Pt reports this has helped a little but still feels terrible. No f/c/s.  Cipro has worked well in the past. Research officer, [employer].  Pls advise.  Thank you. Shirlean Mylar states she can bring in a urine specimen tomorrow if needed.  Last OV with SN: 03/05/13 [facility] No Known Allergies - verified with Shirlean Mylar

## 2013-04-05 NOTE — Telephone Encounter (Signed)
Per SN---  ua and urine culture  rec to call in cirpo 250 mg  #14  1 po bid until gone---to start after she drops off sample of urine.  thanks

## 2013-04-06 ENCOUNTER — Other Ambulatory Visit (INDEPENDENT_AMBULATORY_CARE_PROVIDER_SITE_OTHER): Payer: Medicare Other

## 2013-04-06 DIAGNOSIS — R300 Dysuria: Secondary | ICD-10-CM

## 2013-04-06 LAB — URINALYSIS, ROUTINE W REFLEX MICROSCOPIC
Bilirubin Urine: NEGATIVE
Ketones, ur: NEGATIVE
Urine Glucose: NEGATIVE

## 2013-04-08 LAB — URINE CULTURE

## 2013-04-22 ENCOUNTER — Other Ambulatory Visit: Payer: Self-pay | Admitting: *Deleted

## 2013-04-22 MED ORDER — SYNTHROID 125 MCG PO TABS: ORAL_TABLET | ORAL | Status: DC

## 2013-05-21 ENCOUNTER — Other Ambulatory Visit: Payer: Self-pay | Admitting: Pulmonary Disease

## 2013-05-21 MED ORDER — SYNTHROID 125 MCG PO TABS: ORAL_TABLET | ORAL | Status: DC

## 2013-06-04 ENCOUNTER — Telehealth: Payer: Self-pay | Admitting: Internal Medicine

## 2013-06-04 DIAGNOSIS — R0602 Shortness of breath: Secondary | ICD-10-CM

## 2013-06-04 MED ORDER — FUROSEMIDE 20 MG PO TABS: ORAL_TABLET | ORAL | Status: DC

## 2013-06-04 NOTE — Telephone Encounter (Signed)
Spoke with patients daughter and her Mom is taking Furosemide tid on a continuous basis and feels she needs this to stay at her base wait

## 2013-06-04 NOTE — Telephone Encounter (Signed)
Pt still having problem  with weight gain, taking three tabs now some days four, needs refill running out , uses [facility], also wants samples of pradaxa

## 2013-06-16 ENCOUNTER — Other Ambulatory Visit: Payer: Self-pay | Admitting: Internal Medicine

## 2013-07-13 ENCOUNTER — Telehealth: Payer: Self-pay

## 2013-07-13 NOTE — Telephone Encounter (Signed)
SAMPLES

## 2013-07-21 ENCOUNTER — Other Ambulatory Visit: Payer: Self-pay | Admitting: Internal Medicine

## 2013-08-19 ENCOUNTER — Telehealth: Payer: Self-pay | Admitting: Pulmonary Disease

## 2013-08-19 MED ORDER — AZITHROMYCIN 250 MG PO TABS: ORAL_TABLET | ORAL | Status: DC

## 2013-08-19 NOTE — Telephone Encounter (Signed)
I guess it okay for Zpack #1 , take as drieceted, no refills  mucinex dm Twice daily  As needed  Cough If not improving will need ov  Please contact office for sooner follow up if symptoms do not improve or worsen or seek emergency care   Would be better for ov .

## 2013-08-19 NOTE — Telephone Encounter (Signed)
I spoke with pt daughter Lattie Haw. She reports pt is coughing w/ yellow phlem, wheezing, chest tx x last froday. No f/c/s/n/v/no increase SOB. Daughter states pt needs an ABX. Please advise Dr. Annamaria Boots as Dr. Lenna Gilford is off. Thanks Last OV 03/05/13/ No Known Allergies  Pending 09/06/13

## 2013-08-19 NOTE — Telephone Encounter (Signed)
I called and made daughter aware of recs. rx called in for pt. Nothing further needed

## 2013-08-24 ENCOUNTER — Other Ambulatory Visit: Payer: Self-pay | Admitting: Pulmonary Disease

## 2013-09-06 ENCOUNTER — Encounter: Payer: Self-pay | Admitting: Pulmonary Disease

## 2013-09-06 ENCOUNTER — Ambulatory Visit (INDEPENDENT_AMBULATORY_CARE_PROVIDER_SITE_OTHER): Payer: Medicare Other | Admitting: Pulmonary Disease

## 2013-09-06 VITALS — BP 128/68 | HR 83 | Temp 97.5°F | Ht 66.5 in | Wt 211.1 lb

## 2013-09-06 DIAGNOSIS — M899 Disorder of bone, unspecified: Secondary | ICD-10-CM

## 2013-09-06 DIAGNOSIS — E785 Hyperlipidemia, unspecified: Secondary | ICD-10-CM

## 2013-09-06 DIAGNOSIS — E039 Hypothyroidism, unspecified: Secondary | ICD-10-CM

## 2013-09-06 DIAGNOSIS — I5032 Chronic diastolic (congestive) heart failure: Secondary | ICD-10-CM

## 2013-09-06 DIAGNOSIS — I679 Cerebrovascular disease, unspecified: Secondary | ICD-10-CM

## 2013-09-06 DIAGNOSIS — I4892 Unspecified atrial flutter: Secondary | ICD-10-CM

## 2013-09-06 DIAGNOSIS — F329 Major depressive disorder, single episode, unspecified: Secondary | ICD-10-CM

## 2013-09-06 DIAGNOSIS — E663 Overweight: Secondary | ICD-10-CM

## 2013-09-06 DIAGNOSIS — N289 Disorder of kidney and ureter, unspecified: Secondary | ICD-10-CM

## 2013-09-06 DIAGNOSIS — M1990 Unspecified osteoarthritis, unspecified site: Secondary | ICD-10-CM

## 2013-09-06 NOTE — Patient Instructions (Signed)
Today we updated your med list in our EPIC system...    Continue your current medications the same...  Work on diet & weight reduction...  Call for any questions...  Let's plan a follow up visit in 31mo, with FASTING blood work around that time.Marland KitchenMarland Kitchen

## 2013-09-06 NOTE — Progress Notes (Signed)
Subjective:    Patient ID: Sonya Kaufman, female    DOB: 01-06-1931, 79 y.o.   MRN: [id number]  HPI 79 y/o WF here for a follow up visit... she has mult med problems as noted below... she is followed by DrSteiner for Psychiatry w/ severe depression on 5 diff meds as below...  ~  Feb 27, 2012:  4mo ROV & review> Sonya Kaufman is doing quite well now according to her hx & confirmed by daughter; she was hosp 2/13 w/ acute on chr diastolic CHF & her Lithium level was too high- she was diuresed & Lithium adjusted & she is much improved (their notes are reviewed); meds adjusted by Cards- DrAllred (AFib on Pradaxa & Metoprolol and hasn't required pacer)    We reviewed prob list, meds, xrays and labs> see below>> LABS 6/13 ==> FLP looks good on Prav40;  Chems- ok w/ stable Creat=1.5, and normal LFTs...   ~  September 04, 2012:  82mo ROV & Sonya Kaufman has had a good 61mo interval- no new complaints or concerns, & confirmed by her daughter who brought her to the office today... We reviewed these following problems during today's office visit:     AB> on Proair prn; breathing is good, no exac & denies cough, phlegm, hemoptysis, SOB, etc; she is too sedentary & needs to incr exercise program...    AFib/Flutter, chr diastolic CHF> on [id number], MetopER25Bid, Lasix20-2Bid, K20Bid; BP=132/68 & she denies CP, palpit, ch in DOE, edema; she saw DrAllred 9/13- stable, no changes made...    Cerebrovasc dis> she has sm vessel dis on old [place]; denies cerebral ischemic symptoms...    VI> she knows to elim sodium, elevate legs, wear support hose, & continue the Lasix...    Chol> on Simva20; last FLP 6/13 showed TChol 141, TG 103, HDL 52, LDL 69    Hypothy> on Synthroid125/d & last TSH was checked 2/13 = 1.16    Overweight> wt= 208# which is up 3# over the last 62mo; we reviewed diet, exercise, & wt reduction strategies...    GI- Divertics, Polyps> last colonoscopy by DrStark was 10/10 w/ several sm polyps removed- one tubular adenoma  & f/u planned 2015; she denies abd pain, n/v, c/d, blood seen...    DJD, Osteopenia> she uses OTC analgesics, calcium, MVI, VitD supplements...    Depression> followed by DrSteiner for Psychiatry on  Lethium300, Wellbutrin300, Lexapro20, Zyprexa2.5-1/2tab; she has been stable on these meds & doing well overall...  We reviewed prob list, meds, xrays and labs> see below for updates >>  LABS 12/13:  BMet is stable w/ Creat=1.5.Marland KitchenMarland Kitchen   ~  March 05, 2013:  46mo ROV & Sonya Kaufman is doing well- no new complaints or concerns; she's had f/u appts w/ DrAllred & the pacer clinic- doing well, holding NSR, stable on Pradaxa, no changes made; We reviewed the following medical problems during today's office visit >>     AB> on Proair prn; breathing is good, no exac & denies cough, phlegm, hemoptysis, SOB, etc; she is too sedentary & needs to incr exercise program...    AFib/Flutter, chr diastolic CHF> on [id number], MetopER25Bid, Lasix20-2Bid, K20Bid; BP=126/74 & she denies CP, palpit, ch in DOE, edema; she saw DrAllred 2/14- stable, no changes made...    Cerebrovasc dis> she has sm vessel dis on old [place]; denies cerebral ischemic symptoms...    VI> she knows to elim sodium, elevate legs, wear support hose, & continue the Lasix...    Chol> on Simva20; last FLP 3/14 showed  TChol 151, TG 112, HDL 48, LDL 80    Hypothy> on Synthroid125/d & last TSH was checked 3/14 = 0.96    Overweight> wt= 208# which is stable over the last 64mo; we reviewed diet, exercise, & wt reduction strategies...    GI- Divertics, Polyps> last colonoscopy by DrStark was 10/10 w/ several sm polyps removed- one tubular adenoma & f/u planned 2015; she denies abd pain, n/v, c/d, blood seen...    Renal Insuffic> on Lasix20-2Bid her Creat was up to 1.7 but has returned to baseline 1.5 now; continue same meds...    DJD, Osteopenia> she uses OTC analgesics, calcium, MVI, VitD supplements...    Depression> hx severe depression; followed by DrSteiner for  Psychiatry on  Lithium300, Wellbutrin300, Lexapro20, Zyprexa2.5-1/2tab; she has been stable on these meds & doing well overall...  We reviewed prob list, meds, xrays and labs> see below for updates >>  LABS 2/14-3/14:  FLP- at goals on Simva20;  Chems- ok x Cr=1.7;  CBC- wnl;  TSH=0.96...  LABS 6/14:  Chems- ok w/ Creat=1.5.Marland Kitchen.  ~  September 06, 2013:  54mo ROV & Sonya Kaufman reports a good 25mo interval w/o new complaints or concerns; her daughters look after her very well & monitor her wt for fluid retention; DrAllred saw her 6/14 and cut back her Lasix from 40mg  bid to 40mg  Qam but they later had to add an additional 20mg  Lasix in the asfternoon; currently her BP= 128/68, chest is clear, and she has no edema;  Her Afib/flutter is controlled w/ MetopER25mg Bid and cardiac exam reveals HR=84, gr1-2/6 SEM w/o gallops and no bruits... She denies CP, palpit, change in chr stable DOE, dizzy/ syncope/ etc... She remains on Psyche meds per DrSteiner w/ doses adjusted per her hx as noted below...     We reviewed prob list, meds, xrays and labs> see below for updates >> she had the 2014 Flu vaccine in Oct...           Problem List:  ASTHMATIC BRONCHITIS, ACUTE (ICD-466.0) - on PROAIR as needed; she is a non-smoker> denies cough, sputum, hemoptysis, worsening dyspnea,  wheezing, chest pains, snoring, daytime hypersomnolence, etc...  ~  CXR 11/11 showed cardiomeg, clear lungs, NAD.Marland Kitchen. ~  CXR 3/12 showed cardiomeg, vasc congestion, mild interst edema> c/w mild CHF... ~  CXR 2/13 showed mild cardiomegaly, mild vasc congestion, mild basilar atx [facility] for CHF). ~  CTAngio 2/13 showed no evid PE, cardiomeg, CHF/ mild pulm edema, sm effusions...  ACUTE ON CHRONIC DIASTOLIC HEART FAILURE> on METOPROLOL ER 50mg - 1.5tabsBid, LASIX 20mg - 2Bid, KCl 31mEq Bid,and PRADAXA 75mg  bid... ATRIAL FIBRILLATION & ATRIAL FLUTTER - new onset noted 11/11 OV & pt asymptomatic w/o CP, palpit, dizzy, SOB, etc> referred to DrAllred. ~  She has  had freq med adjustments in the interval by Cards/ DrAllred> on & off Cardizem, up & down on Pradaxa Loss adjuster, chartered & Epic reviewed). ~  5/12:  They report current meds: METOPROLOL 25mg - 2Bid, LASIX 20mg - 2AM & 1PM, KCl 52mEq Bid,and PRADAXA 150mg  bid. ~  11/12:  She had recent check up w/ DrAllred & meds now listed> METOPROLOL ER 50mg Bid, LASIX 20mg - 2AM & 1PM, KCl 33mEq Bid,and PRADAXA 75mg  bid. ~  2/13:  Hosp for acute on chronic diastolic CHF & AFib- managed by DrAllred & meds adjusted as noted above; office f/u 4/13 & stable... ~  12/13: on Pradaxa75Bid, MetopER25Bid, Lasix20-2Bid, K20Bid; BP=132/68 & she denies CP, palpit, ch in DOE, edema; f/u DrAllred 9/13- stable, no changes. ~  6/14: on Pradaxa75Bid, MetopER25Bid, Lasix20-2Bid, K20Bid; BP=126/74 & she denies CP, palpit, ch in DOE, edema; she saw DrAllred 6/14- and he decr her Lasix20 from 4/d to 2/d... ~  12/14: on Pradaxa75Bid,  MetopER25Bid, Lasix20- now on 2Qam & 1Qpm, K20Bid; BP=128/68 & she remains largely asymptomatic...   CEREBROVASCULAR DISEASE (ICD-437.9) - on PRADAXA 75mg  Bid...  ~  prev eval by DrLove in 2003... CT showed bilat sm vessel ischemic disease... prev on ASA 81mg /d... She denies any cerebral ischemic symptoms... ~  She remains stable on the Pradaxa w/o cerebral ischemic symptoms...  VENOUS INSUFFICIENCY (ICD-459.81) - she takes LASIX as above & follows a low sodium diet and uses support hose as needed...  HYPERLIPIDEMIA (ICD-272.4) - on SIMVASTATIN 20mg /d + FishOil 1000mg /d... ~  [place] 11/07 showed TChol 140, Tg 90, HDL 52, LDL 70... ~  [place] 5/09 showed TChol 181, TG 105, HDL 51, LDL 110... rec- same med, better diet for now. ~  FLP 9/10 showed TChol 174, TG 129, HDL 63, LDL 86 ~  She needs to return FASTING for FLP recheck... ~  [place] 3/12 showed TChol 121, TG 58, HDL 57, LDL 52... Note LFTs sl elev w/ SGOT 67, SGPT 74... ~  [place] 6/13 on Simva20 showed TChol 141, TG 103, HDL 52, LDL 69... Note LFTs are wnl now... ~  [place]  3/14 on simva20 showed TChol 151, TG 112, HDL 48, LDL 80   HYPOTHYROIDISM (ICD-244.9) - currently on SYNTHROID 125mcg/d... ~  prev TSH 7/08 was 3.56 ~  labs 5/09 showed TSH= 2.06 ~  labs 9/10 showed TSH= 2.30 ~  labs 3/11 showed TSH= 3.13, FreeT3= 2.4 (2.3-4.2), FreeT4= 0.9 (0.6-1.6)... rec incr to 116mcg/d. ~  labs 11/11 on Levoth125 showed TSH= 0.92... keep same for now. ~  During the 2/12 hosp she was cut back to 196mcg/d dose & she says she feels better on this dose. ~  Labs 3/12 showed TSH= 5.80 & reminded to take med every day... ~  Labs 10/12 showed TSH= 0.62... We have dose listed as 175mcg/d. ~  Labs 2/13 in [place] showed TSH= 1.16 on Synthroid119mcg/d now... ~  Labs 3/14 on Synthroid125 showed TSH= 0.96  OVERWEIGHT (ICD-278.02) - diet + exercise program discussed... ~  weight 5/09 = 206# ~  weight 9/10 = 206# ~  weight 3/11 = 213# ~  weight 11/11 = 209# ~  Weight 5/12 = 206# ~  Weight 11/12 = 207# ~  Weight 5/13 = 205# ~  Weight 12/13 = 208# ~  Weight 6/14 = 208# ~  Weight 12/14 = 211#  DIVERTICULOSIS OF COLON (ICD-562.10) COLONIC POLYPS (ICD-211.3) - she had routine colonoscopy 10/10 by DrStark showing several 4-40mm polyps & mild divertics- path showed one tubular adenoma & f/u planned 62yrs.  OSTEOARTHRITIS (ICD-715.90) - hx knee pain in past, now c/o intermiitent right hip pain... she never filled Mobic Rx & uses OTC pain meds Prn.  OSTEOPENIA (ICD-733.90) - I can't find prev BMD in the system... takes Calcium, MVI, Vit D... ~  labs 5/09 showed Vit D level = 31... rec- 1000 u Vit D OTC supplement.  DEPRESSION (ICD-311) - This has been her major problem over the years... several psyche hospitalizations and one OD admit... followed by DrSteiner every 2-3 months, on several meds & adjusted frequently... Currently on WELLBUTRIN XL 300,  LEXAPRO 20,  ZYPREXA 2.5 (taking 0.5 tabs daily), &  LITHOBID 300. ~  9/10:  she reports doing very well on her regimen- stable w/o acute  problems. ~  3/11:  reports incr depressive symptoms- meds adjusted & [place] added by DrSteiner. ~  11/11: reports stable on her 5 med regimen... she & husb continue to care for their 109 y/o son w/ CP. ~  5/12: she is currently on 4 meds from DrSteiner, husb ill, kids having to care for them + handicaped brother... ~  11/12: current meds listed as> WELLBUTRIN XL 300,  LEXAPRO 20,  ZYPREXA 2.5 (taking 1.5 tabs daily), &  LITHOBID incr to 2 tabs daily... ~  5/13:  Her meds have been adjusted & daugh says much improved> on WELLBUTRIN XL 300,  LEXAPRO 20,  ZYPREXA 2.5, &  LITHOBID 300mg /d. ~  12/13: followed by DrSteiner for Psychiatry on  Lethium300, Wellbutrin300, Lexapro20, Zyprexa2.5-1/2tab; she has been stable on these meds & doing well overall...  ~  6/14: hx severe depression; followed by DrSteiner for Psychiatry on  Lithium300, Wellbutrin300, Lexapro20, Zyprexa2.5-1/2tab; she has been stable on these meds & doing well overall... ~  12/14: she remains under the care of DrSteiner for Psychiatry on  Lithium300, Wellbutrin300, Lexapro20- 1/4 tab, Zyprexa2.5-1/2tab...  HEALTH MAINTENANCE: ~  GI:  Followed by DrStark w/ colon 2010 as above... ~  GYN:   ~  Immunizations:  She gets the yearly Flu vaccine;  She had Pneumovax prev;  Given TDAP 11/12 in office...   Past Surgical History  Procedure Laterality Date  . Tonsillectomy      Outpatient Encounter Prescriptions as of 09/06/2013  Medication Sig  . azithromycin (ZITHROMAX) 250 MG tablet Take as directed  . buPROPion (WELLBUTRIN XL) 300 MG 24 hr tablet Take 300 mg by mouth daily.    . ciprofloxacin (CIPRO) 250 MG tablet Take 1 tablet (250 mg total) by mouth 2 (two) times daily.  . dabigatran (PRADAXA) 75 MG CAPS Take 1 capsule (75 mg total) by mouth every 12 (twelve) hours.  Marland Kitchen escitalopram (LEXAPRO) 20 MG tablet Take 1/4 tablet by mouth daily  . furosemide (LASIX) 20 MG tablet Take tid and as need for weight gain of 2-3 pounds  . lithium  carbonate (LITHOBID) 300 MG CR tablet Take 300 mg by mouth daily.  . metoprolol succinate (TOPROL-XL) 25 MG 24 hr tablet TAKE ONE TABLET BY MOUTH TWICE DAILY  . OLANZapine (ZYPREXA) 2.5 MG tablet Take 1/2 tablet by mouth at bedtime  . polyethylene glycol (MIRALAX / GLYCOLAX) packet Take 17 g by mouth 2 (two) times daily.    . potassium chloride SA (K-DUR,KLOR-CON) 20 MEQ tablet Take 1 tablet (20 mEq total) by mouth 2 (two) times daily.  Marland Kitchen PROAIR HFA 108 (90 BASE) MCG/ACT inhaler INHALE TWO PUFFS BY MOUTH EVERY 6 HOURS AS NEEDED.  Marland Kitchen simvastatin (ZOCOR) 20 MG tablet TAKE ONE TABLET BY MOUTH AT BEDTIME  . SYNTHROID 125 MCG tablet TAKE ONE TABLET BY MOUTH DAILY.    No Known Allergies   Current Medications, Allergies, Past Medical History, Past Surgical History, Family History, and Social History were reviewed in [employer] record.    Review of Systems         See HPI - all other systems neg except as noted... The patient complains of SOB/ DOE, & depression.  The patient denies anorexia, fever, weight loss, weight gain, vision loss, decreased hearing, hoarseness, chest pain, syncope, peripheral edema, prolonged cough, headaches, hemoptysis, abdominal pain, melena, hematochezia, severe indigestion/heartburn, hematuria, incontinence, muscle weakness, suspicious skin lesions, transient blindness, difficulty walking, unusual weight change, abnormal bleeding, enlarged lymph nodes, and angioedema.  Objective:   Physical Exam     WD, Overweight, 79 y/o WF in NAD... she has a flat affect... Vital Signs:  Reviewed... GENERAL:  Alert & oriented; pleasant & cooperative... HEENT:  [place]/AT, EOM-wnl, PERRLA, EACs-clear, TMs-wnl, NOSE-clear, THROAT-clear & wnl. NECK:  Supple w/ fairROM; no JVD; normal carotid impulses w/o bruits; no thyromegaly or nodules palpated; no lymphadenopathy. CHEST:  Clear to P & A; without wheezes/ rales/ or rhonchi heard... HEART:  Irreg rhythm w/  variable VR; without murmurs/ rubs/ or gallops detected... ABDOMEN:  Obese, soft & nontender; normal bowel sounds; no organomegaly or masses palpated... EXT: without deformities, mild arthritic changes; no varicose veins/ +venous insuffic/ no edema. NEURO:  CN's intact;  no focal neuro deficits... DERM:  No lesions noted; no rash etc...  RADIOLOGY DATA:  Reviewed in the EPIC EMR & discussed w/ the patient...  LABORATORY DATA:  Reviewed in the EPIC EMR & discussed w/ the patient...   Assessment & Plan:    CARDIAC>  Followed by DrAllred- Diastolic CHF, AFib/Flutter, etc>  Stable on Pradaxa, Toprol, Lasix, KCl... Continue same...  LIPIDS>  She is tol Simva20 + FishOil very well> f/u FLP & LFTs all look good- continue same Rx...  HYPOTHY>  On Syntroid 155mcg/d now & stable clinically; continue same...  Overweight>  We discussed diet + exercise program...  GI>  Stable...  Renal>  Creat stable ~1.5 baseline on her Lasix dose...  ORTHO>  DJD, Osteopenia> she uses OTC analgesics prn but needs Calcium, MVI, Vit D supplementation...  DEPRESSION>  A major prob for her, she sees DrSteiner for meds, continue regular f/u w/ Psychiatry...   Patient's Medications  New Prescriptions   No medications on file  Previous Medications   BUPROPION (WELLBUTRIN XL) 300 MG 24 HR TABLET    Take 300 mg by mouth daily.     DABIGATRAN (PRADAXA) 75 MG CAPS    Take 1 capsule (75 mg total) by mouth every 12 (twelve) hours.   ESCITALOPRAM (LEXAPRO) 20 MG TABLET    Take 1/4 tablet by mouth daily   LITHIUM CARBONATE (LITHOBID) 300 MG CR TABLET    Take 300 mg by mouth daily.   METOPROLOL SUCCINATE (TOPROL-XL) 25 MG 24 HR TABLET    TAKE ONE TABLET BY MOUTH TWICE DAILY   OLANZAPINE (ZYPREXA) 2.5 MG TABLET    Take 1/2 tablet by mouth at bedtime   POLYETHYLENE GLYCOL (MIRALAX / GLYCOLAX) PACKET    Take 17 g by mouth 2 (two) times daily.     POTASSIUM CHLORIDE SA (K-DUR,KLOR-CON) 20 MEQ TABLET    Take 1 tablet (20  mEq total) by mouth 2 (two) times daily.   PROAIR HFA 108 (90 BASE) MCG/ACT INHALER    INHALE TWO PUFFS BY MOUTH EVERY 6 HOURS AS NEEDED.   SIMVASTATIN (ZOCOR) 20 MG TABLET    TAKE ONE TABLET BY MOUTH AT BEDTIME   SYNTHROID 125 MCG TABLET    TAKE ONE TABLET BY MOUTH DAILY.  Modified Medications   Modified Medication Previous Medication   FUROSEMIDE (LASIX) 20 MG TABLET furosemide (LASIX) 20 MG tablet      Take 2 tablets by mouth at breakfast and 1 at lunch    Take tid and as need for weight gain of 2-3 pounds  Discontinued Medications   AZITHROMYCIN (ZITHROMAX) 250 MG TABLET    Take as directed   CIPROFLOXACIN (CIPRO) 250 MG TABLET    Take 1 tablet (250 mg total) by mouth 2 (two) times daily.

## 2013-09-15 ENCOUNTER — Other Ambulatory Visit: Payer: Self-pay | Admitting: Pulmonary Disease

## 2013-09-15 ENCOUNTER — Other Ambulatory Visit: Payer: Self-pay | Admitting: Internal Medicine

## 2013-09-15 ENCOUNTER — Encounter: Payer: Self-pay | Admitting: Internal Medicine

## 2013-09-15 ENCOUNTER — Ambulatory Visit (INDEPENDENT_AMBULATORY_CARE_PROVIDER_SITE_OTHER): Payer: Medicare Other | Admitting: Internal Medicine

## 2013-09-15 VITALS — BP 128/66 | HR 82 | Ht 66.5 in | Wt 208.1 lb

## 2013-09-15 DIAGNOSIS — I5032 Chronic diastolic (congestive) heart failure: Secondary | ICD-10-CM

## 2013-09-15 DIAGNOSIS — I498 Other specified cardiac arrhythmias: Secondary | ICD-10-CM

## 2013-09-15 DIAGNOSIS — N289 Disorder of kidney and ureter, unspecified: Secondary | ICD-10-CM

## 2013-09-15 DIAGNOSIS — R001 Bradycardia, unspecified: Secondary | ICD-10-CM

## 2013-09-15 DIAGNOSIS — I4891 Unspecified atrial fibrillation: Secondary | ICD-10-CM

## 2013-09-15 MED ORDER — POTASSIUM CHLORIDE CRYS ER 20 MEQ PO TBCR: 20.0000 meq | EXTENDED_RELEASE_TABLET | Freq: Every day | ORAL | Status: DC

## 2013-09-15 NOTE — Patient Instructions (Addendum)
Your physician wants you to follow-up in: 6 months with Dr Vallery Ridge will receive a reminder letter in the mail two months in advance. If you don't receive a letter, please call our office to schedule the follow-up appointment.  Your physician recommends that you return for lab work in 2 weeks at [place] has recommended you make the following change in your medication:  1) Decrease Potassium to 47meq daily

## 2013-09-15 NOTE — Progress Notes (Signed)
PCP: Noralee Space, MD  Sonya Kaufman is a 79 y.o. female who presents today for routine cardiology followup.  Since last being seen in our clinic, the patient reports doing very well.  Today, she denies symptoms of palpitations, chest pain, shortness of breath,  lower extremity edema, dizziness, presyncope, or syncope.  The patient is otherwise without complaint today.   Past Medical History  Diagnosis Date  . Diastolic CHF, chronic   . Other and unspecified hyperlipidemia     EF 60-65% by echo 11/2011  . Unspecified hypothyroidism   . Overweight(278.02)   . Diverticulosis of colon (without mention of hemorrhage)   . Benign neoplasm of colon   . Osteoarthrosis, unspecified whether generalized or localized, unspecified site   . Disorder of bone and cartilage, unspecified   . Depressive disorder, not elsewhere classified   . Venous insufficiency   . Renal insufficiency   . Paroxysmal atrial fibrillation   . Atrial flutter   . Tachycardia-bradycardia    Past Surgical History  Procedure Laterality Date  . Tonsillectomy      Current Outpatient Prescriptions  Medication Sig Dispense Refill  . buPROPion (WELLBUTRIN XL) 300 MG 24 hr tablet Take 300 mg by mouth daily.        . dabigatran (PRADAXA) 75 MG CAPS Take 1 capsule (75 mg total) by mouth every 12 (twelve) hours.  60 capsule  11  . escitalopram (LEXAPRO) 20 MG tablet Take 20 mg by mouth daily. Take 1/4 tablet by mouth daily      . furosemide (LASIX) 20 MG tablet Take 2 tablets by mouth at breakfast and 1 at lunch      . lithium carbonate (LITHOBID) 300 MG CR tablet Take 300 mg by mouth daily.      . metoprolol succinate (TOPROL-XL) 25 MG 24 hr tablet TAKE ONE TABLET BY MOUTH TWICE DAILY  60 tablet  0  . OLANZapine (ZYPREXA) 2.5 MG tablet Take 1/2 tablet by mouth at bedtime      . polyethylene glycol (MIRALAX / GLYCOLAX) packet Take 17 g by mouth 2 (two) times daily.        . potassium chloride SA (K-DUR,KLOR-CON) 20 MEQ tablet Take  1 tablet (20 mEq total) by mouth daily.  60 tablet  11  . PROAIR HFA 108 (90 BASE) MCG/ACT inhaler INHALE TWO PUFFS BY MOUTH EVERY 6 HOURS AS NEEDED.  1 each  5  . SYNTHROID 125 MCG tablet TAKE ONE TABLET BY MOUTH DAILY.  30 tablet  11  . simvastatin (ZOCOR) 20 MG tablet TAKE ONE TABLET BY MOUTH AT BEDTIME  30 tablet  6   No current facility-administered medications for this visit.    Physical Exam: Filed Vitals:   09/15/13 1535  BP: 128/66  Pulse: 82  Height: 5' 6.5" (1.689 m)  Weight: 208 lb 1.9 oz (94.403 kg)    GEN- The patient is well appearing, alert and oriented x 3 today.   Head- normocephalic, atraumatic Eyes-  Sclera clear, conjunctiva pink Ears- hearing intact Oropharynx- clear Lungs- Clear to ausculation bilaterally, normal work of breathing Heart- irregular rate and rhythm, no murmurs, rubs or gallops, PMI not laterally displaced GI- soft, NT, ND, + BS Extremities- no clubbing, cyanosis, or edema  ekg today reveals afib, V rate 95 bpm  Assessment and Plan:  Atrial fibrillation  Stable and maintaining sinus  Today, I discussed novel anticoagulants including pradaxa, xarelto, and eliquis today as indicated for risk reduction in stroke and systemic emboli  with nonvalvular atrial fibrillation.  Risks, benefits, and alternatives to each of these drugs were discussed at length today.  Given her advanced age and concerns for bleeding, I would favor switching to eliquis 2.5mg  BID or xarelto 15mg  daily.  At this time, she prefers to stay on pradaxa.  She will contact my office if she decides to [place].   Chronic diastolic heart failure   euvolemic today  Continue daily weights  2g sodium restriction Decrease KDur to 20 meq daily (She does not like taking these pills), return for a bmet in 2 weeks  Bradycardia  resolved  Return in 6 months

## 2013-09-24 ENCOUNTER — Other Ambulatory Visit: Payer: Self-pay | Admitting: Internal Medicine

## 2013-10-01 ENCOUNTER — Other Ambulatory Visit (INDEPENDENT_AMBULATORY_CARE_PROVIDER_SITE_OTHER): Payer: Medicare Other

## 2013-10-01 DIAGNOSIS — I4891 Unspecified atrial fibrillation: Secondary | ICD-10-CM

## 2013-10-01 LAB — BASIC METABOLIC PANEL
BUN: 19 mg/dL (ref 6–23)
CALCIUM: 9.7 mg/dL (ref 8.4–10.5)
CHLORIDE: 101 meq/L (ref 96–112)
CO2: 35 meq/L — AB (ref 19–32)
CREATININE: 1.6 mg/dL — AB (ref 0.4–1.2)
GFR: 33.63 mL/min — ABNORMAL LOW (ref >60.00)
Glucose, Bld: 112 mg/dL — ABNORMAL HIGH (ref 70–99)
Potassium: 4.4 mEq/L (ref 3.5–5.1)
Sodium: 142 mEq/L (ref 135–145)

## 2013-10-13 ENCOUNTER — Other Ambulatory Visit: Payer: Self-pay | Admitting: Internal Medicine

## 2013-10-27 ENCOUNTER — Other Ambulatory Visit: Payer: Self-pay | Admitting: Internal Medicine

## 2013-11-02 ENCOUNTER — Telehealth: Payer: Self-pay | Admitting: Pulmonary Disease

## 2013-11-02 MED ORDER — LEVOFLOXACIN 500 MG PO TABS: 500.0000 mg | ORAL_TABLET | Freq: Every day | ORAL | Status: DC

## 2013-11-02 NOTE — Telephone Encounter (Signed)
Spoke with daughter. She reports pt has a barky prod cough w/ dark yellow- brown phlem, wheezing x 1 week. Using her inhaler and robitussin. Would like something called in. Please advise Dr. Lenna Gilford thanks  No Known Allergies   Current Outpatient Prescriptions on File Prior to Visit  Medication Sig Dispense Refill  . buPROPion (WELLBUTRIN XL) 300 MG 24 hr tablet Take 300 mg by mouth daily.        Marland Kitchen escitalopram (LEXAPRO) 20 MG tablet Take 20 mg by mouth daily. Take 1/4 tablet by mouth daily      . furosemide (LASIX) 20 MG tablet Take 2 tablets by mouth at breakfast and 1 at lunch      . furosemide (LASIX) 20 MG tablet TAKE ONE TABLET BY MOUTH THREE TIMES DAILY AND AS NEEDED FOR WEIGHT GAIN OF 2-3 POUNDS  105 tablet  0  . lithium carbonate (LITHOBID) 300 MG CR tablet Take 300 mg by mouth daily.      . metoprolol succinate (TOPROL-XL) 25 MG 24 hr tablet TAKE ONE TABLET BY MOUTH TWICE DAILY  60 tablet  3  . OLANZapine (ZYPREXA) 2.5 MG tablet Take 1/2 tablet by mouth at bedtime      . polyethylene glycol (MIRALAX / GLYCOLAX) packet Take 17 g by mouth 2 (two) times daily.        . potassium chloride SA (K-DUR,KLOR-CON) 20 MEQ tablet Take 1 tablet (20 mEq total) by mouth daily.  60 tablet  11  . PRADAXA 75 MG CAPS capsule TAKE ONE CAPSULE BY MOUTH EVERY 12 HOURS  60 capsule  6  . PROAIR HFA 108 (90 BASE) MCG/ACT inhaler INHALE TWO PUFFS BY MOUTH EVERY 6 HOURS AS NEEDED.  1 each  5  . simvastatin (ZOCOR) 20 MG tablet TAKE ONE TABLET BY MOUTH AT BEDTIME  30 tablet  6  . SYNTHROID 125 MCG tablet TAKE ONE TABLET BY MOUTH DAILY.  30 tablet  11   No current facility-administered medications on file prior to visit.

## 2013-11-02 NOTE — Telephone Encounter (Signed)
Per SN---   levaquin 500 mg   #7  1 daily Align once daily otc mucinex 2 po bid Increase fluids otc delsym  2 tsp bid

## 2013-11-02 NOTE — Telephone Encounter (Signed)
Spoke with pt and is aware of recs. Nothing further needed

## 2013-11-03 ENCOUNTER — Encounter (HOSPITAL_COMMUNITY): Payer: Self-pay | Admitting: Emergency Medicine

## 2013-11-03 ENCOUNTER — Inpatient Hospital Stay (HOSPITAL_COMMUNITY)
Admission: EM | Admit: 2013-11-03 | Discharge: 2013-11-05 | DRG: 190 | Disposition: A | Discharge status: Home / Self Care | Payer: Medicare Other | Attending: Internal Medicine | Admitting: Internal Medicine

## 2013-11-03 ENCOUNTER — Emergency Department (HOSPITAL_COMMUNITY): Payer: Medicare Other

## 2013-11-03 DIAGNOSIS — N189 Chronic kidney disease, unspecified: Secondary | ICD-10-CM | POA: Diagnosis present

## 2013-11-03 DIAGNOSIS — J209 Acute bronchitis, unspecified: Secondary | ICD-10-CM | POA: Diagnosis present

## 2013-11-03 DIAGNOSIS — J441 Chronic obstructive pulmonary disease with (acute) exacerbation: Secondary | ICD-10-CM | POA: Diagnosis present

## 2013-11-03 DIAGNOSIS — I4891 Unspecified atrial fibrillation: Secondary | ICD-10-CM | POA: Diagnosis present

## 2013-11-03 DIAGNOSIS — R0603 Acute respiratory distress: Secondary | ICD-10-CM

## 2013-11-03 DIAGNOSIS — I5032 Chronic diastolic (congestive) heart failure: Secondary | ICD-10-CM | POA: Diagnosis present

## 2013-11-03 DIAGNOSIS — N289 Disorder of kidney and ureter, unspecified: Secondary | ICD-10-CM

## 2013-11-03 DIAGNOSIS — E039 Hypothyroidism, unspecified: Secondary | ICD-10-CM | POA: Diagnosis present

## 2013-11-03 LAB — BASIC METABOLIC PANEL
BUN: 20 mg/dL (ref 6–23)
CALCIUM: 9.8 mg/dL (ref 8.4–10.5)
CO2: 26 mEq/L (ref 19–32)
CREATININE: 1.75 mg/dL — AB (ref 0.50–1.10)
Chloride: 100 mEq/L (ref 96–112)
GFR calc Af Amer: 31 mL/min — ABNORMAL LOW (ref >90)
GFR, EST NON AFRICAN AMERICAN: 26 mL/min — AB (ref >90)
GLUCOSE: 129 mg/dL — AB (ref 70–99)
Potassium: 3.9 mEq/L (ref 3.7–5.3)
Sodium: 141 mEq/L (ref 137–147)

## 2013-11-03 LAB — CBC
HCT: 46.6 % — ABNORMAL HIGH (ref 36.0–46.0)
HEMOGLOBIN: 15.5 g/dL — AB (ref 12.0–15.0)
MCH: 30.9 pg (ref 26.0–34.0)
MCHC: 33.3 g/dL (ref 30.0–36.0)
MCV: 93 fL (ref 78.0–100.0)
Platelets: 174 10*3/uL (ref 150–400)
RBC: 5.01 MIL/uL (ref 3.87–5.11)
RDW: 13.7 % (ref 11.5–15.5)
WBC: 6.3 10*3/uL (ref 4.0–10.5)

## 2013-11-03 LAB — CG4 I-STAT (LACTIC ACID): Lactic Acid, Venous: 1.13 mmol/L (ref 0.5–2.2)

## 2013-11-03 LAB — TROPONIN I

## 2013-11-03 LAB — PRO B NATRIURETIC PEPTIDE: Pro B Natriuretic peptide (BNP): 1745 pg/mL — ABNORMAL HIGH (ref 0–450)

## 2013-11-03 LAB — INFLUENZA PANEL BY PCR (TYPE A & B)
H1N1FLUPCR: NOT DETECTED
Influenza A By PCR: NEGATIVE
Influenza B By PCR: NEGATIVE

## 2013-11-03 MED ORDER — ACETAMINOPHEN 650 MG RE SUPP: 650.0000 mg | Freq: Four times a day (QID) | RECTAL | Status: DC | PRN

## 2013-11-03 MED ORDER — METOPROLOL TARTRATE 25 MG PO TABS
25.0000 mg | ORAL_TABLET | Freq: Once | ORAL | Status: AC
Start: 2013-11-03 — End: 2013-11-03
  Administered 2013-11-03: 25 mg via ORAL
  Filled 2013-11-03: qty 1

## 2013-11-03 MED ORDER — LEVOFLOXACIN IN D5W 750 MG/150ML IV SOLN
750.0000 mg | INTRAVENOUS | Status: DC
  Administered 2013-11-04: 750 mg via INTRAVENOUS
  Filled 2013-11-03: qty 150

## 2013-11-03 MED ORDER — LEVALBUTEROL HCL 0.63 MG/3ML IN NEBU: 0.6300 mg | INHALATION_SOLUTION | Freq: Four times a day (QID) | RESPIRATORY_TRACT | Status: DC | PRN

## 2013-11-03 MED ORDER — POTASSIUM CHLORIDE CRYS ER 20 MEQ PO TBCR
20.0000 meq | EXTENDED_RELEASE_TABLET | Freq: Two times a day (BID) | ORAL | Status: DC
  Administered 2013-11-03 (×2): 20 meq via ORAL
  Filled 2013-11-03 (×4): qty 1

## 2013-11-03 MED ORDER — MAGNESIUM HYDROXIDE 400 MG/5ML PO SUSP
30.0000 mL | Freq: Once | ORAL | Status: AC
  Administered 2013-11-03: 30 mL via ORAL
  Filled 2013-11-03: qty 30

## 2013-11-03 MED ORDER — METOPROLOL TARTRATE 25 MG PO TABS
25.0000 mg | ORAL_TABLET | Freq: Two times a day (BID) | ORAL | Status: DC
  Filled 2013-11-03 (×2): qty 1

## 2013-11-03 MED ORDER — SODIUM CHLORIDE 0.9 % IJ SOLN
3.0000 mL | Freq: Two times a day (BID) | INTRAMUSCULAR | Status: DC
  Administered 2013-11-03 – 2013-11-05 (×4): 3 mL via INTRAVENOUS

## 2013-11-03 MED ORDER — SENNOSIDES-DOCUSATE SODIUM 8.6-50 MG PO TABS
1.0000 | ORAL_TABLET | Freq: Every evening | ORAL | Status: DC | PRN
  Filled 2013-11-03: qty 1

## 2013-11-03 MED ORDER — METHYLPREDNISOLONE SODIUM SUCC 125 MG IJ SOLR
60.0000 mg | Freq: Four times a day (QID) | INTRAMUSCULAR | Status: DC
  Administered 2013-11-03 – 2013-11-04 (×4): 60 mg via INTRAVENOUS
  Filled 2013-11-03 (×5): qty 0.96
  Filled 2013-11-03: qty 2
  Filled 2013-11-03: qty 0.96
  Filled 2013-11-03: qty 2
  Filled 2013-11-03 (×3): qty 0.96

## 2013-11-03 MED ORDER — DABIGATRAN ETEXILATE MESYLATE 75 MG PO CAPS
75.0000 mg | ORAL_CAPSULE | Freq: Two times a day (BID) | ORAL | Status: DC
  Administered 2013-11-03 – 2013-11-05 (×5): 75 mg via ORAL
  Filled 2013-11-03 (×6): qty 1

## 2013-11-03 MED ORDER — IPRATROPIUM BROMIDE 0.02 % IN SOLN
0.5000 mg | RESPIRATORY_TRACT | Status: DC
  Filled 2013-11-03: qty 2.5

## 2013-11-03 MED ORDER — IPRATROPIUM-ALBUTEROL 0.5-2.5 (3) MG/3ML IN SOLN
3.0000 mL | RESPIRATORY_TRACT | Status: DC
  Administered 2013-11-03 – 2013-11-04 (×5): 3 mL via RESPIRATORY_TRACT
  Filled 2013-11-03 (×5): qty 3

## 2013-11-03 MED ORDER — METHYLPREDNISOLONE SODIUM SUCC 125 MG IJ SOLR
125.0000 mg | Freq: Once | INTRAMUSCULAR | Status: AC
  Administered 2013-11-03: 125 mg via INTRAVENOUS
  Filled 2013-11-03: qty 2

## 2013-11-03 MED ORDER — ESCITALOPRAM OXALATE 10 MG PO TABS
20.0000 mg | ORAL_TABLET | Freq: Every day | ORAL | Status: DC
  Administered 2013-11-03 – 2013-11-05 (×3): 20 mg via ORAL
  Filled 2013-11-03 (×3): qty 2
  Filled 2013-11-03: qty 1
  Filled 2013-11-03: qty 2

## 2013-11-03 MED ORDER — LEVOFLOXACIN IN D5W 750 MG/150ML IV SOLN
750.0000 mg | Freq: Once | INTRAVENOUS | Status: AC
  Administered 2013-11-03: 750 mg via INTRAVENOUS
  Filled 2013-11-03: qty 150

## 2013-11-03 MED ORDER — LEVOTHYROXINE SODIUM 50 MCG PO TABS
50.0000 ug | ORAL_TABLET | Freq: Every day | ORAL | Status: DC
Start: 2013-11-03 — End: 2013-11-04
  Administered 2013-11-03 – 2013-11-04 (×2): 50 ug via ORAL
  Filled 2013-11-03 (×3): qty 1

## 2013-11-03 MED ORDER — ACETAMINOPHEN 325 MG PO TABS: 650.0000 mg | ORAL_TABLET | Freq: Four times a day (QID) | ORAL | Status: DC | PRN

## 2013-11-03 MED ORDER — ONDANSETRON HCL 4 MG/2ML IJ SOLN: 4.0000 mg | Freq: Four times a day (QID) | INTRAMUSCULAR | Status: DC | PRN

## 2013-11-03 MED ORDER — FUROSEMIDE 40 MG PO TABS
40.0000 mg | ORAL_TABLET | Freq: Two times a day (BID) | ORAL | Status: DC
  Filled 2013-11-03 (×3): qty 1

## 2013-11-03 MED ORDER — ALBUTEROL SULFATE (2.5 MG/3ML) 0.083% IN NEBU
5.0000 mg | INHALATION_SOLUTION | Freq: Once | RESPIRATORY_TRACT | Status: AC
  Administered 2013-11-03: 5 mg via RESPIRATORY_TRACT
  Filled 2013-11-03: qty 6

## 2013-11-03 MED ORDER — IPRATROPIUM BROMIDE 0.02 % IN SOLN
0.5000 mg | Freq: Once | RESPIRATORY_TRACT | Status: AC
  Administered 2013-11-03: 0.5 mg via RESPIRATORY_TRACT
  Filled 2013-11-03: qty 2.5

## 2013-11-03 MED ORDER — ALBUTEROL SULFATE (2.5 MG/3ML) 0.083% IN NEBU
2.5000 mg | INHALATION_SOLUTION | RESPIRATORY_TRACT | Status: DC
  Filled 2013-11-03: qty 3

## 2013-11-03 MED ORDER — FUROSEMIDE 10 MG/ML IJ SOLN
40.0000 mg | Freq: Two times a day (BID) | INTRAMUSCULAR | Status: DC
  Administered 2013-11-03 – 2013-11-04 (×3): 40 mg via INTRAVENOUS
  Filled 2013-11-03 (×6): qty 4

## 2013-11-03 MED ORDER — ALBUTEROL SULFATE (2.5 MG/3ML) 0.083% IN NEBU
5.0000 mg | INHALATION_SOLUTION | Freq: Once | RESPIRATORY_TRACT | Status: AC
Start: 2013-11-03 — End: 2013-11-03
  Administered 2013-11-03: 5 mg via RESPIRATORY_TRACT
  Filled 2013-11-03: qty 6

## 2013-11-03 MED ORDER — BUPROPION HCL ER (XL) 300 MG PO TB24
300.0000 mg | ORAL_TABLET | Freq: Every day | ORAL | Status: DC
  Administered 2013-11-03 – 2013-11-05 (×3): 300 mg via ORAL
  Filled 2013-11-03 (×3): qty 1

## 2013-11-03 MED ORDER — METOPROLOL TARTRATE 50 MG PO TABS
50.0000 mg | ORAL_TABLET | Freq: Two times a day (BID) | ORAL | Status: DC
  Administered 2013-11-03 – 2013-11-05 (×4): 50 mg via ORAL
  Filled 2013-11-03 (×5): qty 1

## 2013-11-03 MED ORDER — LITHIUM CARBONATE ER 300 MG PO TBCR
300.0000 mg | EXTENDED_RELEASE_TABLET | Freq: Every day | ORAL | Status: DC
  Administered 2013-11-03 – 2013-11-05 (×3): 300 mg via ORAL
  Filled 2013-11-03 (×3): qty 1

## 2013-11-03 MED ORDER — MENTHOL 3 MG MT LOZG
1.0000 | LOZENGE | OROMUCOSAL | Status: DC | PRN
  Administered 2013-11-03: 3 mg via ORAL
  Filled 2013-11-03: qty 9

## 2013-11-03 MED ORDER — POLYETHYLENE GLYCOL 3350 17 G PO PACK
17.0000 g | PACK | Freq: Every day | ORAL | Status: DC
  Administered 2013-11-03 – 2013-11-05 (×2): 17 g via ORAL
  Filled 2013-11-03 (×3): qty 1

## 2013-11-03 MED ORDER — SIMVASTATIN 20 MG PO TABS
20.0000 mg | ORAL_TABLET | Freq: Every day | ORAL | Status: DC
  Administered 2013-11-03 – 2013-11-05 (×3): 20 mg via ORAL
  Filled 2013-11-03 (×3): qty 1

## 2013-11-03 MED ORDER — OLANZAPINE 2.5 MG PO TABS
2.5000 mg | ORAL_TABLET | Freq: Every day | ORAL | Status: DC
  Administered 2013-11-03 – 2013-11-05 (×3): 2.5 mg via ORAL
  Filled 2013-11-03 (×3): qty 1

## 2013-11-03 MED ORDER — ALBUTEROL SULFATE (2.5 MG/3ML) 0.083% IN NEBU: 2.5000 mg | INHALATION_SOLUTION | RESPIRATORY_TRACT | Status: DC | PRN

## 2013-11-03 MED ORDER — ONDANSETRON HCL 4 MG PO TABS: 4.0000 mg | ORAL_TABLET | Freq: Four times a day (QID) | ORAL | Status: DC | PRN

## 2013-11-03 NOTE — ED Notes (Signed)
Patient placed on droplet isolation precautions pending results from influenza PCR

## 2013-11-03 NOTE — ED Notes (Signed)
Lactic Acid results called to Dr.Linker

## 2013-11-03 NOTE — Progress Notes (Signed)
UR completed Aldyn Toon K. Verenis Nicosia, RN, BSN, MSHL, CCM  11/03/2013 6:16 PM

## 2013-11-03 NOTE — ED Notes (Addendum)
Pt arrives via EMS from home. C/o cold/flu like symptoms since Sunday. C/o lower back pain from coughing. Non-prod cough. No fever. 134/86 100 94% RA rr 16. Pt states that she called her PCP and was prescribed Levquin and started it yesterday. Pt states that she tried taking OTC cough medication which worked during the evening but stopped working at night.

## 2013-11-03 NOTE — ED Provider Notes (Signed)
CSN: [id number]     Arrival date & time 11/03/13  0448 History   First MD Initiated Contact with Patient 11/03/13 0502     Chief Complaint  Patient presents with  . Cough   (Consider location/radiation/quality/duration/timing/severity/associated sxs/prior Treatment) HPI Pt presenting with c/o cough, generalized weakness and chest tightness over the past week.  Symptoms have been worsening.  Tight cough productive of yellow sputum.  Pt denies fever.  Has had decreased appetite and has not been drinking as much as usual.  She had one episode of emesis yesterday.  No chest or abdominal pain.  She was called in levaquin and delsym by her PMD yesterday, took one dose.  However tonight was having increased cough and difficulty breathing.  No leg swelling.  No specific sick contacts.  Did have her flu shot this year. There are no other associated systemic symptoms, there are no other alleviating or modifying factors.   Past Medical History  Diagnosis Date  . Diastolic CHF, chronic   . Other and unspecified hyperlipidemia     EF 60-65% by echo 11/2011  . Unspecified hypothyroidism   . Overweight   . Diverticulosis of colon (without mention of hemorrhage)   . Benign neoplasm of colon   . Osteoarthrosis, unspecified whether generalized or localized, unspecified site   . Disorder of bone and cartilage, unspecified   . Depressive disorder, not elsewhere classified   . Venous insufficiency   . Renal insufficiency   . Paroxysmal atrial fibrillation   . Atrial flutter   . Tachycardia-bradycardia    Past Surgical History  Procedure Laterality Date  . Tonsillectomy     Family History  Problem Relation Age of Onset  . Heart failure Mother   . Arthritis Mother   . Heart disease Father   . Heart disease Other    History  Substance Use Topics  . Smoking status: Never Smoker   . Smokeless tobacco: Never Used  . Alcohol Use: No   OB History   Grav Para Term Preterm Abortions TAB SAB Ect Mult  Living                 Review of Systems ROS reviewed and all otherwise negative except for mentioned in HPI  Allergies  Review of patient's allergies indicates no known allergies.  Home Medications   Current Outpatient Rx  Name  Route  Sig  Dispense  Refill  . buPROPion (WELLBUTRIN XL) 300 MG 24 hr tablet   Oral   Take 300 mg by mouth daily.           . dabigatran (PRADAXA) 75 MG CAPS capsule   Oral   Take 75 mg by mouth 2 (two) times daily.         Marland Kitchen escitalopram (LEXAPRO) 20 MG tablet   Oral   Take 20 mg by mouth daily.          . furosemide (LASIX) 20 MG tablet   Oral   Take 40 mg by mouth 2 (two) times daily.          Marland Kitchen levofloxacin (LEVAQUIN) 500 MG tablet   Oral   Take 1 tablet (500 mg total) by mouth daily.   7 tablet   0   . levothyroxine (SYNTHROID, LEVOTHROID) 50 MCG tablet   Oral   Take 50 mcg by mouth daily before breakfast.         . lithium carbonate (LITHOBID) 300 MG CR tablet   Oral  Take 300 mg by mouth daily.         . metoprolol tartrate (LOPRESSOR) 25 MG tablet   Oral   Take 25 mg by mouth 2 (two) times daily.         Marland Kitchen OLANZapine (ZYPREXA) 2.5 MG tablet   Oral   Take 2.5 mg by mouth daily.          . potassium chloride SA (K-DUR,KLOR-CON) 20 MEQ tablet   Oral   Take 20 mEq by mouth 2 (two) times daily.         . simvastatin (ZOCOR) 20 MG tablet   Oral   Take 20 mg by mouth daily.          BP 137/58  Pulse 118  Temp(Src) 98.6 F (37 C) (Oral)  Resp 24  SpO2 99% Vitals reviewed Physical Exam Physical Examination: General appearance - alert, well appearing, and in no distress Mental status - alert, oriented to person, place, and time Eyes - no conjunctival injection, no scleral icterus Mouth - mucous membranes moist, pharynx normal without lesions Chest - symmetric air entry, bilateral expiratory coarse wheezing, decreased air movement throughout, pt speaking in full sentences Heart - normal rate,  regular rhythm, normal S1, S2, no murmurs, rubs, clicks or gallops Abdomen - soft, nontender, nondistended, no masses or organomegaly Extremities - peripheral pulses normal, no pedal edema, no clubbing or cyanosis Skin - normal coloration and turgor, no rashes  ED Course  Procedures (including critical care time)  7:10 AM pt feeling somewhat improved after first neb, she continues to have wheezing throughout.  D/w triad for admission to telemetry bed. Pt and daughter at bedside are updated and in agreement with the plan.  Labs Review Labs Reviewed  CBC - Abnormal; Notable for the following:    Hemoglobin 15.5 (*)    HCT 46.6 (*)    All other components within normal limits  BASIC METABOLIC PANEL - Abnormal; Notable for the following:    Glucose, Bld 129 (*)    Creatinine, Ser 1.75 (*)    GFR calc non Af Amer 26 (*)    GFR calc Af Amer 31 (*)    All other components within normal limits  PRO B NATRIURETIC PEPTIDE - Abnormal; Notable for the following:    Pro B Natriuretic peptide (BNP) 1745.0 (*)    All other components within normal limits  TROPONIN I  INFLUENZA PANEL BY PCR (TYPE A & B, H1N1)  CG4 I-STAT (LACTIC ACID)   Imaging Review Dg Chest 2 View  11/03/2013   CLINICAL DATA:  History of asthma.  Cough for 5 days.  EXAM: CHEST  2 VIEW  COMPARISON:  11/17/2011  FINDINGS: Interstitial coarsening, similar to previous, when there was pulmonary edema. Focal thickening near the minor fissure peripherally, consistent with scar. No asymmetric opacity. No effusion or pneumothorax. Generous heart size. No acute osseous findings.  IMPRESSION: Interstitial coarsening which could be bronchitic or congestive.   Electronically Signed   By: Jorje Guild M.D.   On: 11/03/2013 06:00    EKG Interpretation    Date/Time:  Wednesday November 03 2013 05:27:11 EST Ventricular Rate:  96 PR Interval:    QRS Duration: 76 QT Interval:  358 QTC Calculation: 452 R Axis:   79 Text Interpretation:   Atrial fibrillation Ventricular premature complex Since previous tracing Atrial fibrillation is new Confirmed by Canary Brim  MD, [place] [phone]) on 11/03/2013 6:32:32 AM  MDM   1. COPD exacerbation   2. Renal insufficiency   3. Atrial fibrillation    Pt presenting with cough, shortness of breath over the past week.  PT with wheezing on exam, appears somewhat dehydrated.  Pt treated with albuterol/atrovent, solumedrol, levaquin.  CXR with chronic appearance.   Xray images reviewed and interpreted by me as well.  D/w triad for admission.      Threasa Beards, MD 11/03/13 [phone]

## 2013-11-03 NOTE — Care Management Note (Addendum)
    Page 1 of 1   11/05/2013     4:28:06 PM   CARE MANAGEMENT NOTE 11/05/2013  Patient:  Sonya Kaufman, Sonya Kaufman   Account Number:  [id number]  Date Initiated:  11/03/2013  Documentation initiated by:  Mariann Laster  Subjective/Objective Assessment:   COPD with acute exacerbation     Action/Plan:   follow for disposition needs   Anticipated DC Date:  11/06/2013   Anticipated DC Plan:  HOME/SELF CARE         Choice offered to / List presented to:     DME arranged  NEBULIZER/MEDS  NEBULIZER MACHINE      DME agency  [name]        Status of service:  In process, will continue to follow Medicare Important Message given?   (If response is "NO", the following Medicare IM given date fields will be blank) Date Medicare IM given:   Date Additional Medicare IM given:    Discharge Disposition:    Per UR Regulation:  Reviewed for med. necessity/level of care/duration of stay  If discussed at [place] of Stay Meetings, dates discussed:    Comments:  11/05/13 [place], RN, BSN, [place] [phone] DME nebulizer machine ordered through [facility]. Derrian Harris notified to deliver to room prior to d/c home.  11/03/2013 IV Lasix, ABX and Solumedrol [employer] RN, BSN, [place], CCM ([phone] 11/03/2013

## 2013-11-03 NOTE — H&P (Signed)
[facility] History and Physical    Jenilyn Philip [id number] DOB: Aug 08, 1931 DOA: 11/03/2013  Referring physician: Dr Canary Brim PCP: Noralee Space, MD   Chief Complaint: sob since Thursday.  HPI: Sonya Kaufman is a 80 y.o. female with prior h/o diastolic heart failure, afib on pradaxa, asthma, stage 3 CKD, came in for worsening sob, for 6 days now. On arrival to ED, she was found to have diffuse wheezing, with  Borderline oxygen sats, coughing with productive sputum. She is afebrile. She denies any chest pain, syncope, palpitations, orthopnea or pnd , pedal edema. She denies nausea , had one episode of vomiting. She denies diarrhea, has constipation . Loss of apettite for one week. No weight gain. On arrival to ED, she had a CXR negative for pneumonia. She is referred to medical service for admission for asthma exacerbation with bronchitis. She is admitted to telemetry.    Review of Systems:  See HPI otherwise negative.   Past Medical History  Diagnosis Date  . Diastolic CHF, chronic   . Other and unspecified hyperlipidemia     EF 60-65% by echo 11/2011  . Unspecified hypothyroidism   . Overweight   . Diverticulosis of colon (without mention of hemorrhage)   . Benign neoplasm of colon   . Osteoarthrosis, unspecified whether generalized or localized, unspecified site   . Disorder of bone and cartilage, unspecified   . Depressive disorder, not elsewhere classified   . Venous insufficiency   . Renal insufficiency   . Paroxysmal atrial fibrillation   . Atrial flutter   . Tachycardia-bradycardia    Past Surgical History  Procedure Laterality Date  . Tonsillectomy     Social History:  reports that she has never smoked. She has never used smokeless tobacco. She reports that she does not drink alcohol or use illicit drugs.  No Known Allergies  Family History  Problem Relation Age of Onset  . Heart failure Mother   . Arthritis Mother   . Heart disease Father   . Heart  disease Other      Prior to Admission medications   Medication Sig Start Date End Date Taking? Authorizing Provider  buPROPion (WELLBUTRIN XL) 300 MG 24 hr tablet Take 300 mg by mouth daily.     Yes Historical Provider, MD  dabigatran (PRADAXA) 75 MG CAPS capsule Take 75 mg by mouth 2 (two) times daily.   Yes Historical Provider, MD  escitalopram (LEXAPRO) 20 MG tablet Take 20 mg by mouth daily.    Yes Historical Provider, MD  furosemide (LASIX) 20 MG tablet Take 40 mg by mouth 2 (two) times daily.  06/04/13  Yes Thompson Grayer, MD  levofloxacin (LEVAQUIN) 500 MG tablet Take 1 tablet (500 mg total) by mouth daily. 11/02/13  Yes Noralee Space, MD  levothyroxine (SYNTHROID, LEVOTHROID) 50 MCG tablet Take 50 mcg by mouth daily before breakfast.   Yes Historical Provider, MD  lithium carbonate (LITHOBID) 300 MG CR tablet Take 300 mg by mouth daily.   Yes Historical Provider, MD  metoprolol tartrate (LOPRESSOR) 25 MG tablet Take 25 mg by mouth 2 (two) times daily.   Yes Historical Provider, MD  OLANZapine (ZYPREXA) 2.5 MG tablet Take 2.5 mg by mouth daily.  11/21/11  Yes Dayna N Dunn, PA-C  potassium chloride SA (K-DUR,KLOR-CON) 20 MEQ tablet Take 20 mEq by mouth 2 (two) times daily.   Yes Historical Provider, MD  simvastatin (ZOCOR) 20 MG tablet Take 20 mg by mouth daily.   Yes Historical Provider,  MD   Physical Exam: Filed Vitals:   11/03/13 0937  BP: 124/76  Pulse: 124  Temp:   Resp:     BP 124/76  Pulse 124  Temp(Src) 98 F (36.7 C) (Oral)  Resp 22  Ht 5\' 7"  (1.702 m)  Wt 90.493 kg (199 lb 8 oz)  BMI 31.24 kg/m2  SpO2 94%  General:  Appears calm and comfortable Eyes: PERRL, normal lids, irises & conjunctiva ENT: grossly normal hearing, lips & tongue Neck: no LAD, masses or thyromegaly Cardiovascular: RRR, no m/r/g. No LE edema. Telemetry: afib .  Respiratory: bilateral exp wheezing, good air entry bilateral. No rhonchi. Abdomen: soft, ntnd Skin: no rash or induration seen on  limited exam Musculoskeletal: grossly normal tone BUE/BLE Psychiatric: grossly normal mood and affect, speech fluent and appropriate Neurologic: grossly non-focal.          Labs on Admission:  Basic Metabolic Panel:  Recent Labs Lab 11/03/13 0534  NA 141  K 3.9  CL 100  CO2 26  GLUCOSE 129*  BUN 20  CREATININE 1.75*  CALCIUM 9.8   Liver Function Tests: No results found for this basename: AST, ALT, ALKPHOS, BILITOT, PROT, ALBUMIN,  in the last 168 hours No results found for this basename: LIPASE, AMYLASE,  in the last 168 hours No results found for this basename: AMMONIA,  in the last 168 hours CBC:  Recent Labs Lab 11/03/13 0534  WBC 6.3  HGB 15.5*  HCT 46.6*  MCV 93.0  PLT 174   Cardiac Enzymes:  Recent Labs Lab 11/03/13 0534  TROPONINI <0.30    BNP (last 3 results)  Recent Labs  11/03/13 0534  PROBNP 1745.0*   CBG: No results found for this basename: GLUCAP,  in the last 168 hours  Radiological Exams on Admission: Dg Chest 2 View  11/03/2013   CLINICAL DATA:  History of asthma.  Cough for 5 days.  EXAM: CHEST  2 VIEW  COMPARISON:  11/17/2011  FINDINGS: Interstitial coarsening, similar to previous, when there was pulmonary edema. Focal thickening near the minor fissure peripherally, consistent with scar. No asymmetric opacity. No effusion or pneumothorax. Generous heart size. No acute osseous findings.  IMPRESSION: Interstitial coarsening which could be bronchitic or congestive.   Electronically Signed   By: Jorje Guild M.D.   On: 11/03/2013 06:00    EKG: afib at 96/min  Assessment/Plan Active Problems:   HYPOTHYROIDISM   Atrial fibrillation   ASTHMATIC BRONCHITIS, ACUTE   Chronic diastolic heart failure   Acute on chronic renal insufficiency   COPD exacerbation   COPD with acute exacerbation   Acute Asthma Exacerbation with bronchitis:  Admitted to step down. Started on IV solumedrol 60 mg Q6hrs.  xopenex and atrovent nebs q4hrs  scheduled for the first 24 hrs. levaquin for bronchitis.  Nasal oxygen as needed to keep sats >90%.  Influenza PCR negative.    Atrial fibrillation with RVR:  probably from the increased work of breathing and nebulizations.  Increased metoprolol to 50 mg q12hrs. Continue with chronic anti coagulation. Pradaxa.  12 lead EKG ordered.  Last echo from 2013 shows good LV ef.    Mild acute on chronic diastolic heart failure: - CXR does not show pulm edema and no pedal edema on exam.  - elevated probnp 1700s today.  - on IV lasix, monitor worsening renal function on IV lasix.  - potassium supplements as needed.  - I/O s, daily weight's.  Hypothyroidism: TSH ordered and resume home dose of  synthroid.   Constipation: Start miralax and senna colace.   Stage 3 CKD; Appears to be at baseline.    DVT prophylaxis On pradaxa.    Code Status: presumed full code Family Communication: discussed with daughter at bedside.  Disposition Plan: home when medically stable  Time spent: 75 min  [facility] Pager [phone]

## 2013-11-04 ENCOUNTER — Encounter (HOSPITAL_COMMUNITY): Payer: Self-pay | Admitting: Student

## 2013-11-04 DIAGNOSIS — J984 Other disorders of lung: Secondary | ICD-10-CM

## 2013-11-04 DIAGNOSIS — R0603 Acute respiratory distress: Secondary | ICD-10-CM | POA: Diagnosis present

## 2013-11-04 DIAGNOSIS — J441 Chronic obstructive pulmonary disease with (acute) exacerbation: Secondary | ICD-10-CM

## 2013-11-04 LAB — CBC
HCT: 45.8 % (ref 36.0–46.0)
Hemoglobin: 15.5 g/dL — ABNORMAL HIGH (ref 12.0–15.0)
MCH: 31.5 pg (ref 26.0–34.0)
MCHC: 33.8 g/dL (ref 30.0–36.0)
MCV: 93.1 fL (ref 78.0–100.0)
PLATELETS: 199 10*3/uL (ref 150–400)
RBC: 4.92 MIL/uL (ref 3.87–5.11)
RDW: 13.6 % (ref 11.5–15.5)
WBC: 14 10*3/uL — ABNORMAL HIGH (ref 4.0–10.5)

## 2013-11-04 LAB — BASIC METABOLIC PANEL
BUN: 29 mg/dL — ABNORMAL HIGH (ref 6–23)
CO2: 22 mEq/L (ref 19–32)
Calcium: 9.6 mg/dL (ref 8.4–10.5)
Chloride: 102 mEq/L (ref 96–112)
Creatinine, Ser: 1.83 mg/dL — ABNORMAL HIGH (ref 0.50–1.10)
GFR, EST AFRICAN AMERICAN: 29 mL/min — AB (ref >90)
GFR, EST NON AFRICAN AMERICAN: 25 mL/min — AB (ref >90)
Glucose, Bld: 177 mg/dL — ABNORMAL HIGH (ref 70–99)
Potassium: 4.3 mEq/L (ref 3.7–5.3)
SODIUM: 142 meq/L (ref 137–147)

## 2013-11-04 LAB — TSH: TSH: 0.329 u[IU]/mL — AB (ref 0.350–4.500)

## 2013-11-04 MED ORDER — METHYLPREDNISOLONE SODIUM SUCC 125 MG IJ SOLR
60.0000 mg | Freq: Two times a day (BID) | INTRAMUSCULAR | Status: DC
  Filled 2013-11-04 (×2): qty 0.96

## 2013-11-04 MED ORDER — LEVOTHYROXINE SODIUM 112 MCG PO TABS
112.0000 ug | ORAL_TABLET | Freq: Every day | ORAL | Status: DC
  Administered 2013-11-05: 112 ug via ORAL
  Filled 2013-11-04 (×3): qty 1

## 2013-11-04 MED ORDER — LEVOFLOXACIN IN D5W 750 MG/150ML IV SOLN
750.0000 mg | INTRAVENOUS | Status: DC
  Filled 2013-11-04: qty 150

## 2013-11-04 MED ORDER — FUROSEMIDE 40 MG PO TABS
40.0000 mg | ORAL_TABLET | Freq: Two times a day (BID) | ORAL | Status: DC
  Administered 2013-11-04 – 2013-11-05 (×2): 40 mg via ORAL
  Filled 2013-11-04 (×4): qty 1

## 2013-11-04 MED ORDER — LEVOTHYROXINE SODIUM 25 MCG PO TABS
25.0000 ug | ORAL_TABLET | Freq: Every day | ORAL | Status: DC
  Filled 2013-11-04: qty 1

## 2013-11-04 MED ORDER — IPRATROPIUM-ALBUTEROL 0.5-2.5 (3) MG/3ML IN SOLN
3.0000 mL | Freq: Three times a day (TID) | RESPIRATORY_TRACT | Status: DC
  Administered 2013-11-04 – 2013-11-05 (×3): 3 mL via RESPIRATORY_TRACT
  Filled 2013-11-04 (×3): qty 3

## 2013-11-04 MED ORDER — LEVALBUTEROL HCL 0.63 MG/3ML IN NEBU: 0.6300 mg | INHALATION_SOLUTION | Freq: Three times a day (TID) | RESPIRATORY_TRACT | Status: DC | PRN

## 2013-11-04 NOTE — Progress Notes (Signed)
[facility] PROGRESS NOTE  Sonya Kaufman [id number] DOB: [birth year] DOA: 11/03/2013 PCP: Noralee Space, MD  Assessment/Plan: 1. Acute on chronic respiratory failure (COPD exacerbation): She has increased cough, sputum, and dyspnea, mod to severe COPD exacerbation. Flu PCR negative.  -Continue solumedrol IV decrease frequency to BID, will transition to PO tomorrow pending respiratory improvement -DuoNebs TID -Xopenex q8h PRN  2. Atrial fibrillation with RVR: RVR resolved. Likely increased work of breathing and nebulizations.  Increased metoprolol to 50 mg q12hrs. Continue with chronic anti coagulation with Pradaxa.  -metoprolol 50mg  q 12h   3. Mild acute on chronic diastolic heart failure: CXR does not show pulm edema and no pedal edema on exam. Elevated probnp 1700s on presentation, could be secondary to A. Fib with RVR. Diuresed 593ml overnight. Appears euvolemic today.  - Discontinued IV lasix, monitor worsening renal function on IV lasix.  - Resumed Lasix 40mg  BID (home dose) - I/O s, daily weight's.   4. Hypothyroidism: TSH low at 0.329. Per her pharmacy, she has been on synthroid 153mcg daily for a long time with last refill on 10/20/13. Per her PCP's last office note, pt is on 122mcg at home.  -Decreased synthroid to 118mcg   5. Constipation - Resolved.  -Continue Miralax and senna colace.   6. Stage 3 CKD. BL Scr of 1.5-1.6, bumped up to 1.85 today after Lasix IV.  -Discontinue Lasix IV, resume home Lasix dose -Continue monitoring   Code Status: Full Family Communication: Daughter at the bedside.  Disposition Plan: Home with no PT once improves clinically.    Consultants:  None Antibiotics:  Levaquin 2/4>>  Subjective: She feels better today and asks to go home. Denies chest pain, abdominal pain, or dysuria.   Objective: Filed Vitals:   11/04/13 0800 11/04/13 0900 11/04/13 1211 11/04/13 1322  BP: 118/78 117/72  115/78  Pulse:  121  98  Temp:  97.5 F  (36.4 C)  97.4 F (36.3 C)  TempSrc:  Oral  Oral  Resp:      Height:      Weight:      SpO2: 94% 93% 94% 92%    Intake/Output Summary (Last 24 hours) at 11/04/13 1529 Last data filed at 11/04/13 1300  Gross per 24 hour  Intake    240 ml  Output    700 ml  Net   -460 ml   Filed Weights   11/03/13 0900 11/04/13 0604  Weight: 199 lb 8 oz (90.493 kg) 199 lb 3.2 oz (90.357 kg)    Exam:   General:  Sitting up in chair, in NAD  Cardiovascular: RRR, no m/r/g.   Respiratory: Bilateral expiratory wheezes at the upper lungs, good air movement, no respiratory distress  Abdomen: Soft, non tender, non distended, BS present  Extremities: Warm and well perfused, no LE edema  Neurologic:  A&O x3, follows commands appropriately, moves all extremities voluntarily.   Data Reviewed: Basic Metabolic Panel:  Recent Labs Lab 11/03/13 0534 11/04/13 0324  NA 141 142  K 3.9 4.3  CL 100 102  CO2 26 22  GLUCOSE 129* 177*  BUN 20 29*  CREATININE 1.75* 1.83*  CALCIUM 9.8 9.6   CBC:  Recent Labs Lab 11/03/13 0534 11/04/13 0324  WBC 6.3 14.0*  HGB 15.5* 15.5*  HCT 46.6* 45.8  MCV 93.0 93.1  PLT 174 199   Cardiac Enzymes:  Recent Labs Lab 11/03/13 0534  TROPONINI <0.30   BNP (last 3 results)  Recent Labs  11/03/13 0534  PROBNP 1745.0*     Studies: Dg Chest 2 View  11/03/2013   CLINICAL DATA:  History of asthma.  Cough for 5 days.  EXAM: CHEST  2 VIEW  COMPARISON:  11/17/2011  FINDINGS: Interstitial coarsening, similar to previous, when there was pulmonary edema. Focal thickening near the minor fissure peripherally, consistent with scar. No asymmetric opacity. No effusion or pneumothorax. Generous heart size. No acute osseous findings.  IMPRESSION: Interstitial coarsening which could be bronchitic or congestive.   Electronically Signed   By: Jorje Guild M.D.   On: 11/03/2013 06:00    Scheduled Meds: . buPROPion  300 mg Oral Daily  . dabigatran  75 mg Oral BID   . escitalopram  20 mg Oral Daily  . furosemide  40 mg Intravenous BID  . ipratropium-albuterol  3 mL Nebulization TID  . [START ON 11/06/2013] levofloxacin (LEVAQUIN) IV  750 mg Intravenous Q48H  . [START ON 11/05/2013] levothyroxine  112 mcg Oral QAC breakfast  . lithium carbonate  300 mg Oral Daily  . methylPREDNISolone (SOLU-MEDROL) injection  60 mg Intravenous Q6H  . metoprolol tartrate  50 mg Oral BID  . OLANZapine  2.5 mg Oral Daily  . polyethylene glycol  17 g Oral Daily  . simvastatin  20 mg Oral Daily  . sodium chloride  3 mL Intravenous Q12H   Continuous Infusions:   Active Problems:   HYPOTHYROIDISM   Atrial fibrillation   ASTHMATIC BRONCHITIS, ACUTE   Chronic diastolic heart failure   Acute on chronic renal insufficiency   COPD exacerbation   COPD with acute exacerbation   Respiratory distress, acute    Time spent: 40 minutes. Greater than 50% of this time was spent in direct contact with the patient coordinating care.    Blain Pais, MD [facility]

## 2013-11-04 NOTE — Progress Notes (Signed)
-   Patient seen and examined with Dr. Hayes Ludwig.. We discussed all aspects of the encounter. I agree with the assessment and plan as stated above. - cont to have wheezing, cont IV steroids, ambulate and check sat with ambulation. - will need PFT's as an outpatient.

## 2013-11-04 NOTE — Evaluation (Signed)
Physical Therapy Evaluation Patient Details Name: Sonya Kaufman MRN: [id number] DOB: Jan 29, 1931 Today's Date: 11/04/2013 Time: [id number] PT Time Calculation (min): 21 min  PT Assessment / Plan / Recommendation History of Present Illness  Pt admit with COPD exacerbation.  Clinical Impression  Pt admitted with above. Pt currently with functional limitations due to the deficits listed below (see PT Problem List). Pt and daughter feel that pt will be fine to go home with husband and children assist.  Pt does not feel back to baseline and has slight balance issues therefore will neeed to use RW at all times initially and is agreeable to that.  Pt and daughter decline HHPT at this time as they feel that they have had a safety eval in the past in the home and daughter is a nurse at the Cancer center and states she can progress her mom with activity.   Pt will benefit from skilled PT to increase their independence and safety with mobility to allow discharge to the venue listed below.    PT Assessment  Patient needs continued PT services    Follow Up Recommendations  No PT follow up (Recommended safety eval but daughter states they had that )                Equipment Recommendations  None recommended by PT         Frequency Min 3X/week    Precautions / Restrictions Precautions Precautions: Fall Restrictions Weight Bearing Restrictions: No   Pertinent Vitals/Pain VSS - sats >92% on RA  throughout evaluation, no pain      Mobility  Bed Mobility General bed mobility comments: Standing up on arrival with Nursing student as she just used 3N1 Transfers Overall transfer level: Needs assistance Equipment used: None Transfers: Sit to/from Stand Sit to Stand: Min guard General transfer comment: Cues for hand placement for safety and steadying asssit. Assist to control descent into chair.  Ambulation/Gait Ambulation/Gait assistance: Min guard Ambulation Distance (Feet): 180 Feet Assistive  device: 1 person hand held assist Gait Pattern/deviations: Step-through pattern;Decreased stride length Gait velocity interpretation: <1.8 ft/sec, indicative of risk for recurrent falls General Gait Details: Pt slightly unsteady at times but self corrects.  Can accept min challenges to balance but has difficulty accepting moderate challenges to balance therefore recommend pt use a RW initially upon d/c and pt agrees.  She has RW at home.  O2 at 92 % on RA with ambulation and 94% at rest on RA therefore nursing agreed pt could remove O2.           PT Diagnosis: Generalized weakness  PT Problem List: Decreased activity tolerance;Decreased balance;Decreased mobility;Decreased knowledge of use of DME;Decreased safety awareness;Decreased knowledge of precautions PT Treatment Interventions: DME instruction;Gait training;Functional mobility training;Therapeutic activities;Therapeutic exercise;Balance training;Patient/family education     PT Goals(Current goals can be found in the care plan section) Acute Rehab PT Goals Patient Stated Goal: to go home PT Goal Formulation: With patient Time For Goal Achievement: 11/11/13 Potential to Achieve Goals: Good  Visit Information  Last PT Received On: 11/04/13 Assistance Needed: +1 History of Present Illness: Pt admit with COPD exacerbation.       Prior [place] expects to be discharged to:: Private residence Living Arrangements: Spouse/significant other Available Help at Discharge: Family;Available 24 hours/day Type of Home: House Home Access: Ramped entrance Home Layout: One level Home Equipment: Walker - 4 wheels;Cane - single point;Bedside commode;Wheelchair - manual;Hand held shower head;Tub bench;Grab bars - toilet;Grab bars -  tub/shower Prior Function Level of Independence: Independent Communication Communication: No difficulties    Cognition  Cognition Arousal/Alertness: Awake/alert Behavior During  Therapy: WFL for tasks assessed/performed Overall Cognitive Status: Within Functional Limits for tasks assessed    Extremity/Trunk Assessment Upper Extremity Assessment Upper Extremity Assessment: Defer to OT evaluation Lower Extremity Assessment Lower Extremity Assessment: Generalized weakness Cervical / Trunk Assessment Cervical / Trunk Assessment: Normal   Balance Balance Overall balance assessment: Needs assistance Standing balance support: No upper extremity supported;During functional activity Standing balance-Leahy Scale: Fair Standing balance comment: Can stand statically with guard asssit but cannot accept challenges.  Not back to baseline.   End of Session PT - End of Session Equipment Utilized During Treatment: Gait belt Activity Tolerance: Patient limited by fatigue Patient left: in chair;with call bell/phone within reach;with family/visitor present Nurse Communication: Mobility status       INGOLD,Rayford Williamsen 11/04/2013, 11:14 AM [facility] [phone] (pager)

## 2013-11-05 DIAGNOSIS — I5032 Chronic diastolic (congestive) heart failure: Secondary | ICD-10-CM

## 2013-11-05 LAB — BASIC METABOLIC PANEL WITH GFR
BUN: 41 mg/dL — ABNORMAL HIGH (ref 6–23)
CO2: 23 meq/L (ref 19–32)
Calcium: 9.2 mg/dL (ref 8.4–10.5)
Chloride: 103 meq/L (ref 96–112)
Creatinine, Ser: 1.87 mg/dL — ABNORMAL HIGH (ref 0.50–1.10)
GFR calc Af Amer: 28 mL/min — ABNORMAL LOW
GFR calc non Af Amer: 24 mL/min — ABNORMAL LOW
Glucose, Bld: 158 mg/dL — ABNORMAL HIGH (ref 70–99)
Potassium: 4.7 meq/L (ref 3.7–5.3)
Sodium: 141 meq/L (ref 137–147)

## 2013-11-05 LAB — CBC
HEMATOCRIT: 47.5 % — AB (ref 36.0–46.0)
HEMOGLOBIN: 15.8 g/dL — AB (ref 12.0–15.0)
MCH: 31.1 pg (ref 26.0–34.0)
MCHC: 33.3 g/dL (ref 30.0–36.0)
MCV: 93.5 fL (ref 78.0–100.0)
Platelets: 217 10*3/uL (ref 150–400)
RBC: 5.08 MIL/uL (ref 3.87–5.11)
RDW: 13.8 % (ref 11.5–15.5)
WBC: 19.1 10*3/uL — AB (ref 4.0–10.5)

## 2013-11-05 MED ORDER — FUROSEMIDE 20 MG PO TABS: 40.0000 mg | ORAL_TABLET | Freq: Every day | ORAL | Status: DC

## 2013-11-05 MED ORDER — IPRATROPIUM-ALBUTEROL 0.5-2.5 (3) MG/3ML IN SOLN: 3.0000 mL | Freq: Three times a day (TID) | RESPIRATORY_TRACT | Status: DC | PRN

## 2013-11-05 MED ORDER — PREDNISONE 10 MG PO TABS: ORAL_TABLET | ORAL | Status: DC

## 2013-11-05 MED ORDER — PREDNISONE 50 MG PO TABS
50.0000 mg | ORAL_TABLET | Freq: Every day | ORAL | Status: DC
  Filled 2013-11-05: qty 1

## 2013-11-05 MED ORDER — LEVOFLOXACIN 750 MG PO TABS: 750.0000 mg | ORAL_TABLET | ORAL | Status: DC

## 2013-11-05 MED ORDER — LEVOTHYROXINE SODIUM 112 MCG PO TABS: 112.0000 ug | ORAL_TABLET | Freq: Every day | ORAL | Status: DC

## 2013-11-05 NOTE — Progress Notes (Signed)
Discharge instructions discussed with patient and daughter, [name]. Educated on low sodium diet and follow up appointments. Patient and daughter verbally understand instructions.

## 2013-11-05 NOTE — Progress Notes (Signed)
Heart monitor discontinued due to patient being discharged. CCMD notified. Cardiac monitor cleaned and placed in appropriate cubby at nurses station.

## 2013-11-05 NOTE — Progress Notes (Signed)
[facility] PROGRESS NOTE  Sonya Kaufman [id number] DOB: [birth year] DOA: 11/03/2013 PCP: Noralee Space, MD  Assessment/Plan: 1. Acute on chronic respiratory failure (COPD exacerbation): She has increased cough, sputum, and dyspnea, mod to severe COPD exacerbation. Flu PCR negative.  -Discontinued solumedrol IV decrease frequency to BID, -Start prednisone 50mg  PO   -DuoNebs TID  -Xopenex q8h PRN  -Continue Levaquin 750mg  q 48 hours   2. Atrial fibrillation with RVR: RVR resolved. Likely increased work of breathing and nebulizations.  Increased metoprolol to 50 mg q12hrs. Continue with chronic anti coagulation with Pradaxa.  -metoprolol 50mg  q 12h   3. Mild acute on chronic diastolic heart failure: CXR does not show pulm edema and no pedal edema on exam. Elevated probnp 1700s on presentation, could be secondary to A. Fib with RVR. Diuresed 777ml overnight. Appears euvolemic today.  - Resumed Lasix 40mg  BID (home dose)  - I/O s, daily weight's.   4. Hypothyroidism: TSH low at 0.329. Per her pharmacy, she has been on synthroid 16mcg daily for a long time with last refill on 10/20/13. Per her PCP's last office note, pt is on 149mcg at home.  -Decreased synthroid to 165mcg  -Will need TSH recheck in 6 weeks  5. Constipation - Resolved.  -Continue Miralax and senna colace.   6. Stage 3 CKD. BL Scr of 1.5-1.6, bumped up to 1.87 after Lasix IV.  -Discontinue Lasix IV, resume home Lasix dose  -Continue monitoring  -Encourage fluid intake  7. Leucocytosis - WBC of 19.1. Likely secondary to steroid use. Pt remains afebrile with clinical improvement with no dysuria or diarrhea.   Code Status: Full Family Communication: daughter and grandson at bedside Disposition Plan: Anticipated discharge is today.    Consultants:  None  Antibiotics:  Levaquin 2/4>>  Subjective: Feels much better today with no complaints.   Objective: Filed Vitals:   11/05/13 0900 11/05/13 1031  11/05/13 1059 11/05/13 1414  BP: 129/84 122/60  124/75  Pulse: 91 94  85  Temp:  98.1 F (36.7 C)  98 F (36.7 C)  TempSrc:  Oral  Oral  Resp:  18  18  Height:      Weight:      SpO2: 94% 91% 92% 92%    Intake/Output Summary (Last 24 hours) at 11/05/13 1430 Last data filed at 11/05/13 1318  Gross per 24 hour  Intake    840 ml  Output    800 ml  Net     40 ml   Filed Weights   11/04/13 0604 11/04/13 2149 11/05/13 0500  Weight: 199 lb 3.2 oz (90.357 kg) 202 lb 6.1 oz (91.8 kg) 202 lb 6.1 oz (91.8 kg)    Exam:  General: Sitting up in chair, in NAD  Cardiovascular: RRR, no m/r/g.  Respiratory: Bilateral expiratory wheezes at the upper lungs, good air movement, no respiratory distress Abdomen: Soft, non tender, non distended, BS present  Extremities: Warm and well perfused, no LE edema  Neurologic: A&O x3, follows commands appropriately, moves all extremities voluntarily.   Data Reviewed: Basic Metabolic Panel:  Recent Labs Lab 11/03/13 0534 11/04/13 0324 11/05/13 0537  NA 141 142 141  K 3.9 4.3 4.7  CL 100 102 103  CO2 26 22 23   GLUCOSE 129* 177* 158*  BUN 20 29* 41*  CREATININE 1.75* 1.83* 1.87*  CALCIUM 9.8 9.6 9.2    CBC:  Recent Labs Lab 11/03/13 0534 11/04/13 0324 11/05/13 0537  WBC 6.3 14.0* 19.1*  HGB 15.5* 15.5*  15.8*  HCT 46.6* 45.8 47.5*  MCV 93.0 93.1 93.5  PLT 174 199 217   Cardiac Enzymes:  Recent Labs Lab 11/03/13 0534  TROPONINI <0.30   BNP (last 3 results)  Recent Labs  11/03/13 0534  PROBNP 1745.0*      Studies: No results found.  Scheduled Meds: . buPROPion  300 mg Oral Daily  . dabigatran  75 mg Oral BID  . escitalopram  20 mg Oral Daily  . furosemide  40 mg Oral BID  . ipratropium-albuterol  3 mL Nebulization TID  . [START ON 11/06/2013] levofloxacin (LEVAQUIN) IV  750 mg Intravenous Q48H  . levothyroxine  112 mcg Oral QAC breakfast  . lithium carbonate  300 mg Oral Daily  . methylPREDNISolone (SOLU-MEDROL)  injection  60 mg Intravenous Q12H  . metoprolol tartrate  50 mg Oral BID  . OLANZapine  2.5 mg Oral Daily  . polyethylene glycol  17 g Oral Daily  . simvastatin  20 mg Oral Daily  . sodium chloride  3 mL Intravenous Q12H   Continuous Infusions:   Active Problems:   HYPOTHYROIDISM   Atrial fibrillation   ASTHMATIC BRONCHITIS, ACUTE   Chronic diastolic heart failure   Acute on chronic renal insufficiency   COPD exacerbation   COPD with acute exacerbation   Respiratory distress, acute    Time spent: 40 minutes. Greater than 50% of this time was spent in direct contact with the patient coordinating care.    Blain Pais, MD [facility]

## 2013-11-05 NOTE — Progress Notes (Signed)
Physical Therapy Treatment Patient Details Name: Sonya Kaufman MRN: [id number] DOB: August 26, 1931 Today's Date: 11/05/2013 Time: 1204-1228 PT Time Calculation (min): 24 min  PT Assessment / Plan / Recommendation  History of Present Illness Pt admit with COPD exacerbation.   PT Comments   Pt admitted with above. Pt currently with functional limitations due to balance and endurance  deficits. Pt will benefit from skilled PT to increase their independence and safety with mobility to allow discharge to the venue listed below.   Follow Up Recommendations  No PT follow up;Supervision - Intermittent                 Equipment Recommendations  None recommended by PT        Frequency Min 3X/week   Progress towards PT Goals Progress towards PT goals: Progressing toward goals  Plan Current plan remains appropriate    Precautions / Restrictions Precautions Precautions: Fall Restrictions Weight Bearing Restrictions: No   Pertinent Vitals/Pain VSS, no pain    Mobility  Bed Mobility Overal bed mobility: Needs Assistance Bed Mobility: Supine to Sit Supine to sit: Min guard General bed mobility comments: Made bed flat and pt able to push up on elbows and sit up. Took incr time to get to sitting.   Transfers Overall transfer level: Needs assistance Equipment used: None Transfers: Sit to/from Stand Sit to Stand: Min guard General transfer comment: Cues for hand placement for safety and steadying asssit. Assist to control descent into chair.  Ambulation/Gait Ambulation/Gait assistance: Min guard Ambulation Distance (Feet): 250 Feet Assistive device: Rolling walker (2 wheeled) Gait Pattern/deviations: Step-through pattern;Decreased stride length Gait velocity interpretation: <1.8 ft/sec, indicative of risk for recurrent falls General Gait Details: Pt slightly unsteady at times but self corrects.  Can accept min challenges to balance but has difficulty accepting moderate challenges to  balance.  Pt agrees to use RW initially. O2 at 92 % on RA with ambulation and 94% at rest on RA.    Exercises General Exercises - Upper Extremity Elbow Flexion: AROM;Both;10 reps;Seated General Exercises - Lower Extremity Hip ABduction/ADduction: AROM;Both;15 reps;Standing Mini-Sqauts: AROM;Both;10 reps;Standing Other Exercises Other Exercises: Pt also given stretches for LEs as well - gave CHF handout as well.     PT Goals (current goals can now be found in the care plan section)    Visit Information  Last PT Received On: 11/05/13 Assistance Needed: +1 History of Present Illness: Pt admit with COPD exacerbation.    Subjective Data  Subjective: "My cough is worse today."   Cognition  Cognition Arousal/Alertness: Awake/alert Behavior During Therapy: WFL for tasks assessed/performed Overall Cognitive Status: Within Functional Limits for tasks assessed    Balance  Balance Overall balance assessment: Needs assistance;History of Falls Standing balance support: No upper extremity supported;During functional activity Standing balance-Leahy Scale: Fair Standing balance comment: Still needs min guard asssit with challenges.   End of Session PT - End of Session Equipment Utilized During Treatment: Gait belt Activity Tolerance: Patient limited by fatigue Patient left: in chair;with call bell/phone within reach;with family/visitor present Nurse Communication: Mobility status        INGOLD,Darrio Bade 11/05/2013, 2:27 PM [facility] [phone] (pager)

## 2013-11-05 NOTE — Progress Notes (Signed)
Patient taken out for discharge via wheelchairs. Daughter, [name] taking patient home.

## 2013-11-05 NOTE — Progress Notes (Addendum)
Patient 95% on room air while at rest. Patient ambulated in hall and was 92% on room air during ambulation. Patient did not drop below 92% at any time while ambulating.

## 2013-11-05 NOTE — Progress Notes (Signed)
-   Patient seen and examined with Dr. Hayes Ludwig. We discussed all aspects of the encounter. I agree with the assessment and plan as stated above.  - Change steroids to orals ambulate.

## 2013-11-05 NOTE — Discharge Instructions (Signed)
Increase oral fluid intake as tolerated.

## 2013-11-05 NOTE — Discharge Summary (Signed)
Physician Discharge Summary  Sonya Kaufman [id number] DOB: Sep 24, 1931 DOA: 11/03/2013  PCP: Noralee Space, MD  Admit date: 11/03/2013 Discharge date: 11/05/2013  Time spent: 45 minutes  Recommendations for Outpatient Follow-up:  -Repeat BMET for creatinine trending, resume BID dose of Lasix as indicated -Recheck TSH in 6 weeks -She will need PFTs as outpatient  Discharge Diagnoses:  Active Problems:   HYPOTHYROIDISM   Atrial fibrillation   ASTHMATIC BRONCHITIS, ACUTE   Chronic diastolic heart failure   Acute on chronic renal insufficiency   COPD exacerbation   COPD with acute exacerbation   Respiratory distress, acute   Discharge Condition: Stable  Filed Weights   11/04/13 0604 11/04/13 2149 11/05/13 0500  Weight: 199 lb 3.2 oz (90.357 kg) 202 lb 6.1 oz (91.8 kg) 202 lb 6.1 oz (91.8 kg)    History of present illness:  Sonya Kaufman is a 80 y.o. female with prior h/o diastolic heart failure, afib on pradaxa, asthma, stage 3 CKD, came in for worsening sob, for 6 days now. On arrival to ED, she was found to have diffuse wheezing, with Borderline oxygen sats, coughing with productive sputum. She is afebrile. She denies any chest pain, syncope, palpitations, orthopnea or pnd , pedal edema. She denies nausea , had one episode of vomiting. She denies diarrhea, has constipation . Loss of apettite for one week. No weight gain. On arrival to ED, she had a CXR negative for pneumonia. She is referred to medical service for admission for asthma exacerbation with bronchitis. She is admitted to telemetry.    Hospital Course:  1. Acute on chronic respiratory failure (COPD exacerbation): She had increased cough, sputum, and dyspnea, with mod to severe COPD exacerbation. Flu PCR negative. She was treated with solumedrol IV, DuoNebs TID, Xopenex q8h PRN and Levaquin 750mg  (dose q 48 hours for renal function). Initially she required O2 supplementation but she gradually improved with O2 saturation of  92-95% on RA with ambulation. She will be discharge with Levaquin 750mg  q 48hr x3 (last dose on 2/11), duo nebs TID PRN, and prednisone taper. She will follow up with her PCP next week.   2. Atrial fibrillation with RVR: HR of 120s initially. RVR resolved the next day of her hospitalization after her metoprolol was increased to 50mg  q12 hr with HR of 90s. Likely increased work of breathing and nebulizations. We continued her chronic anti coagulation with Pradaxa.   3. Mild acute on chronic diastolic heart failure: CXR did not show pulmonary edema and no pedal edema on exam. She had elevated probnp 1700s on presentation but this could be secondary to A. Fib with RVR. She received once dose of IV lasix and with net diureses of 529ml since her admission. Appears euvolemic today. Will discharge her with Lasix of 40mg  daily, she will follow up with her PCP for reassessment of her volume status and restart of Lasix 40mg  BID as indicated.      4. Hypothyroidism: TSH low at 0.329. Per her pharmacy, she has been on synthroid 144mcg daily for a long time with last refill on 10/20/13. Per her PCP's last office note, pt is on 165mcg at home. Decreased synthroid to 157mcg. She will need TSH recheck in 6 weeks.    5. Constipation - Resolved after Miralax and senna colace.   6. Stage 3 CKD. BL Scr of 1.5-1.6, bumped up to 1.8 after Lasix IV which remained stable until her discharge. She will be discharged with half her home dose of Lasix. She will  need repeat BMET with her outpatient follow visit.   7. Leucocytosis - WBC of 19.1 on day of her discharge. Likely secondary to steroid use. She remains afebrile with clinical improvement with no dysuria or diarrhea.    Procedures:  None  Consultations:  None  Discharge Instructions      Discharge Orders   Future Appointments Provider Department Dept Phone   11/12/2013 9:30 AM Melvenia Needles, NP [facility] [phone]   03/09/2014 10:00 AM Noralee Space, MD [facility] [phone]   Future Orders Complete By Expires   Diet - low sodium heart healthy  As directed    DME Nebulizer machine  As directed    Increase activity slowly  As directed        Medication List         buPROPion 300 MG 24 hr tablet  Commonly known as:  WELLBUTRIN XL  Take 300 mg by mouth daily.     dabigatran 75 MG Caps capsule  Commonly known as:  PRADAXA  Take 75 mg by mouth 2 (two) times daily.     escitalopram 20 MG tablet  Commonly known as:  LEXAPRO  Take 20 mg by mouth daily.     furosemide 20 MG tablet  Commonly known as:  LASIX  Take 2 tablets (40 mg total) by mouth daily.     ipratropium-albuterol 0.5-2.5 (3) MG/3ML Soln  Commonly known as:  DUONEB  Take 3 mLs by nebulization 3 (three) times daily as needed (for wheezing or shortness of breath).     levofloxacin 750 MG tablet  Commonly known as:  LEVAQUIN  Take 1 tablet (750 mg total) by mouth every other day. With doses on 2/7, 2/9, and last dose on 2/11  Start taking on:  11/06/2013     levothyroxine 112 MCG tablet  Commonly known as:  SYNTHROID, LEVOTHROID  Take 1 tablet (112 mcg total) by mouth daily before breakfast.     lithium carbonate 300 MG CR tablet  Commonly known as:  LITHOBID  Take 300 mg by mouth daily.     metoprolol tartrate 25 MG tablet  Commonly known as:  LOPRESSOR  Take 25 mg by mouth 2 (two) times daily.     OLANZapine 2.5 MG tablet  Commonly known as:  ZYPREXA  Take 2.5 mg by mouth daily.     potassium chloride SA 20 MEQ tablet  Commonly known as:  K-DUR,KLOR-CON  Take 20 mEq by mouth 2 (two) times daily.     predniSONE 10 MG tablet  Commonly known as:  DELTASONE  Take 4 tabs (40 mg) for 2 days, then 3 tabs (30mg ) for 2 days, then 2 tabs (20mg ) for 2 days, then 1 tab(10mg ) for 2 days.  Start taking on:  11/06/2013     simvastatin 20 MG tablet  Commonly known as:  ZOCOR  Take 20 mg by mouth daily.       No Known Allergies Follow-up  Information   Follow up with NADEL,SCOTT M, MD. Schedule an appointment as soon as possible for a visit on 11/12/2013. (Friday, @ 09:30, Rexene Edison, NP)    Specialty:  Pulmonary Disease   Contact information:   [address] [place] [address] [phone]        The results of significant diagnostics from this hospitalization (including imaging, microbiology, ancillary and laboratory) are listed below for reference.    Significant Diagnostic Studies: Dg Chest 2 View  11/03/2013  CLINICAL DATA:  History of asthma.  Cough for 5 days.  EXAM: CHEST  2 VIEW  COMPARISON:  11/17/2011  FINDINGS: Interstitial coarsening, similar to previous, when there was pulmonary edema. Focal thickening near the minor fissure peripherally, consistent with scar. No asymmetric opacity. No effusion or pneumothorax. Generous heart size. No acute osseous findings.  IMPRESSION: Interstitial coarsening which could be bronchitic or congestive.   Electronically Signed   By: Jorje Guild M.D.   On: 11/03/2013 06:00    Microbiology: Flu PCR negative   Labs: Basic Metabolic Panel:  Recent Labs Lab 11/03/13 0534 11/04/13 0324 11/05/13 0537  NA 141 142 141  K 3.9 4.3 4.7  CL 100 102 103  CO2 26 22 23   GLUCOSE 129* 177* 158*  BUN 20 29* 41*  CREATININE 1.75* 1.83* 1.87*  CALCIUM 9.8 9.6 9.2   CBC:  Recent Labs Lab 11/03/13 0534 11/04/13 0324 11/05/13 0537  WBC 6.3 14.0* 19.1*  HGB 15.5* 15.5* 15.8*  HCT 46.6* 45.8 47.5*  MCV 93.0 93.1 93.5  PLT 174 199 217   Cardiac Enzymes:  Recent Labs Lab 11/03/13 0534  TROPONINI <0.30   BNP: BNP (last 3 results)  Recent Labs  11/03/13 0534  PROBNP 1745.0*    Signed:  Blain Pais, MD PGY-II [facility]

## 2013-11-06 NOTE — Discharge Summary (Signed)
Patient seen and examined with Dr. Hayes Ludwig. We discussed all aspects of the encounter. I agree with the assessment and plan as stated above.  - Follow up with Dr. Lenna Gilford will need PFT's as an outpatinet.

## 2013-11-12 ENCOUNTER — Other Ambulatory Visit (INDEPENDENT_AMBULATORY_CARE_PROVIDER_SITE_OTHER): Payer: Medicare Other

## 2013-11-12 ENCOUNTER — Encounter: Payer: Self-pay | Admitting: Adult Health

## 2013-11-12 ENCOUNTER — Ambulatory Visit (INDEPENDENT_AMBULATORY_CARE_PROVIDER_SITE_OTHER): Payer: Medicare Other | Admitting: Adult Health

## 2013-11-12 VITALS — BP 106/58 | HR 78 | Temp 97.2°F | Ht 67.0 in | Wt 203.6 lb

## 2013-11-12 DIAGNOSIS — J209 Acute bronchitis, unspecified: Secondary | ICD-10-CM

## 2013-11-12 DIAGNOSIS — I4891 Unspecified atrial fibrillation: Secondary | ICD-10-CM

## 2013-11-12 DIAGNOSIS — N289 Disorder of kidney and ureter, unspecified: Secondary | ICD-10-CM

## 2013-11-12 DIAGNOSIS — I5032 Chronic diastolic (congestive) heart failure: Secondary | ICD-10-CM

## 2013-11-12 DIAGNOSIS — E039 Hypothyroidism, unspecified: Secondary | ICD-10-CM

## 2013-11-12 LAB — BASIC METABOLIC PANEL
BUN: 31 mg/dL — ABNORMAL HIGH (ref 6–23)
CALCIUM: 9.6 mg/dL (ref 8.4–10.5)
CHLORIDE: 102 meq/L (ref 96–112)
CO2: 29 meq/L (ref 19–32)
CREATININE: 1.8 mg/dL — AB (ref 0.4–1.2)
GFR: 28.17 mL/min — ABNORMAL LOW (ref >60.00)
Glucose, Bld: 82 mg/dL (ref 70–99)
Potassium: 3.7 mEq/L (ref 3.5–5.1)
Sodium: 140 mEq/L (ref 135–145)

## 2013-11-12 LAB — CBC WITH DIFFERENTIAL/PLATELET
BASOS PCT: 0 % (ref 0.0–3.0)
Basophils Absolute: 0 10*3/uL (ref 0.0–0.1)
Eosinophils Absolute: 0.1 10*3/uL (ref 0.0–0.7)
Eosinophils Relative: 0.9 % (ref 0.0–5.0)
HEMATOCRIT: 50 % — AB (ref 36.0–46.0)
HEMOGLOBIN: 15.9 g/dL — AB (ref 12.0–15.0)
LYMPHS ABS: 3.5 10*3/uL (ref 0.7–4.0)
Lymphocytes Relative: 28.8 % (ref 12.0–46.0)
MCHC: 31.9 g/dL (ref 30.0–36.0)
MCV: 94 fl (ref 78.0–100.0)
MONOS PCT: 8.5 % (ref 3.0–12.0)
Monocytes Absolute: 1 10*3/uL (ref 0.1–1.0)
NEUTROS ABS: 7.5 10*3/uL (ref 1.4–7.7)
Neutrophils Relative %: 61.8 % (ref 43.0–77.0)
Platelets: 263 10*3/uL (ref 150.0–400.0)
RBC: 5.32 Mil/uL — AB (ref 3.87–5.11)
RDW: 14.5 % (ref 11.5–14.6)
WBC: 12.1 10*3/uL — ABNORMAL HIGH (ref 4.5–10.5)

## 2013-11-12 NOTE — Assessment & Plan Note (Signed)
Improved control - recent flare w/ RVR w/ acute illness Rate controlled with no sign of fluid overload

## 2013-11-12 NOTE — Assessment & Plan Note (Signed)
Check bmet today  Recent bump in scr w/ diuresis

## 2013-11-12 NOTE — Assessment & Plan Note (Signed)
Recent overcompensation on rx  Synthroid adjusted during hospital stay  Recheck tsh in 4 weeks

## 2013-11-12 NOTE — Assessment & Plan Note (Signed)
Recent flare now resolved Taper off steroids as planned

## 2013-11-12 NOTE — Progress Notes (Signed)
Subjective:    Patient ID: Sonya Kaufman, female    DOB: 20-Jan-1931, 80 y.o.   MRN: [id number]  HPI 80 y/o WFmult med problems as noted below... she is followed by DrSteiner for Psychiatry w/ severe depression   11/12/2013 [facility] follow up  Patient returns for a post hospital followup. Patient was admitted February 4 through February 6 for AEAB (never smoker) , exacerbation, with acute on chronic respiratory failure. Flu PCR was negative. She was treated with IV antibiotics, steroids, and nebulized bronchodilators. She was discharged on Levaquin and a steroid taper. Patient did have atrial filled with RVR. Her metoprolol was increased to 50 mg twice daily. She was continued on her chronic anticoagulation with Pradaxa .  Has finished levaquin . Has 2 days left of prednisone.   Patient did have signs of fluid overload and was diuresed. The clinical improvement. She is chronic renal insufficiency. Serum creatinine did bump after diuresis. Since discharge. Patient reports that she is feeling improved with decreased shortness of breath and cough. Patient denies any hemoptysis, fever, orthopnea, PND, or leg swelling. Feels she is improving and close to her baseline.  TSH hyper, synthroid was adjusted to 124mcg.            Problem List:  ASTHMATIC BRONCHITIS, ACUTE (ICD-466.0) - on PROAIR as needed; she is a non-smoker> denies cough, sputum, hemoptysis, worsening dyspnea,  wheezing, chest pains, snoring, daytime hypersomnolence, etc...  ~  CXR 11/11 showed cardiomeg, clear lungs, NAD.Marland Kitchen. ~  CXR 3/12 showed cardiomeg, vasc congestion, mild interst edema> c/w mild CHF... ~  CXR 2/13 showed mild cardiomegaly, mild vasc congestion, mild basilar atx [facility] for CHF). ~  CTAngio 2/13 showed no evid PE, cardiomeg, CHF/ mild pulm edema, sm effusions...  ACUTE ON CHRONIC DIASTOLIC HEART FAILURE> on METOPROLOL ER 50mg - 1.5tabsBid, LASIX 20mg - 2Bid, KCl 35mEq Bid,and PRADAXA 75mg  bid... ATRIAL  FIBRILLATION & ATRIAL FLUTTER - new onset noted 11/11 OV & pt asymptomatic w/o CP, palpit, dizzy, SOB, etc> referred to DrAllred. ~  She has had freq med adjustments in the interval by Cards/ DrAllred> on & off Cardizem, up & down on Pradaxa Loss adjuster, chartered & Epic reviewed). ~  5/12:  They report current meds: METOPROLOL 25mg - 2Bid, LASIX 20mg - 2AM & 1PM, KCl 26mEq Bid,and PRADAXA 150mg  bid. ~  11/12:  She had recent check up w/ DrAllred & meds now listed> METOPROLOL ER 50mg Bid, LASIX 20mg - 2AM & 1PM, KCl 69mEq Bid,and PRADAXA 75mg  bid. ~  2/13:  Hosp for acute on chronic diastolic CHF & AFib- managed by DrAllred & meds adjusted as noted above; office f/u 4/13 & stable... ~  12/13: on Pradaxa75Bid, MetopER25Bid, Lasix20-2Bid, K20Bid; BP=132/68 & she denies CP, palpit, ch in DOE, edema; f/u DrAllred 9/13- stable, no changes. ~  6/14: on Pradaxa75Bid, MetopER25Bid, Lasix20-2Bid, K20Bid; BP=126/74 & she denies CP, palpit, ch in DOE, edema; she saw DrAllred 6/14- and he decr her Lasix20 from 4/d to 2/d... ~  12/14: on Pradaxa75Bid,  MetopER25Bid, Lasix20- now on 2Qam & 1Qpm, K20Bid; BP=128/68 & she remains largely asymptomatic...   CEREBROVASCULAR DISEASE (ICD-437.9) - on PRADAXA 75mg  Bid...  ~  prev eval by DrLove in 2003... CT showed bilat sm vessel ischemic disease... prev on ASA 81mg /d... She denies any cerebral ischemic symptoms... ~  She remains stable on the Pradaxa w/o cerebral ischemic symptoms...  VENOUS INSUFFICIENCY (ICD-459.81) - she takes LASIX as above & follows a low sodium diet and uses support hose as needed...  HYPERLIPIDEMIA (ICD-272.4) - on  SIMVASTATIN 20mg /d + FishOil 1000mg /d... ~  [place] 11/07 showed TChol 140, Tg 90, HDL 52, LDL 70... ~  [place] 5/09 showed TChol 181, TG 105, HDL 51, LDL 110... rec- same med, better diet for now. ~  [place] 9/10 showed TChol 174, TG 129, HDL 63, LDL 86 ~  She needs to return FASTING for FLP recheck... ~  [place] 3/12 showed TChol 121, TG 58, HDL 57, LDL 52...  Note LFTs sl elev w/ SGOT 67, SGPT 74... ~  [place] 6/13 on Simva20 showed TChol 141, TG 103, HDL 52, LDL 69... Note LFTs are wnl now... ~  [place] 3/14 on simva20 showed TChol 151, TG 112, HDL 48, LDL 80   HYPOTHYROIDISM (ICD-244.9) - currently on SYNTHROID 147mcg/d... ~  prev TSH 7/08 was 3.56 ~  labs 5/09 showed TSH= 2.06 ~  labs 9/10 showed TSH= 2.30 ~  labs 3/11 showed TSH= 3.13, FreeT3= 2.4 (2.3-4.2), FreeT4= 0.9 (0.6-1.6)... rec incr to 132mcg/d. ~  labs 11/11 on Levoth125 showed TSH= 0.92... keep same for now. ~  During the 2/12 hosp she was cut back to 187mcg/d dose & she says she feels better on this dose. ~  Labs 3/12 showed TSH= 5.80 & reminded to take med every day... ~  Labs 10/12 showed TSH= 0.62... We have dose listed as 154mcg/d. ~  Labs 2/13 in [place] showed TSH= 1.16 on Synthroid162mcg/d now... ~  Labs 3/14 on Synthroid125 showed TSH= 0.96  OVERWEIGHT (ICD-278.02) - diet + exercise program discussed... ~  weight 5/09 = 206# ~  weight 9/10 = 206# ~  weight 3/11 = 213# ~  weight 11/11 = 209# ~  Weight 5/12 = 206# ~  Weight 11/12 = 207# ~  Weight 5/13 = 205# ~  Weight 12/13 = 208# ~  Weight 6/14 = 208# ~  Weight 12/14 = 211#  DIVERTICULOSIS OF COLON (ICD-562.10) COLONIC POLYPS (ICD-211.3) - she had routine colonoscopy 10/10 by DrStark showing several 4-45mm polyps & mild divertics- path showed one tubular adenoma & f/u planned 76yrs.  OSTEOARTHRITIS (ICD-715.90) - hx knee pain in past, now c/o intermiitent right hip pain... she never filled Mobic Rx & uses OTC pain meds Prn.  OSTEOPENIA (ICD-733.90) - I can't find prev BMD in the system... takes Calcium, MVI, Vit D... ~  labs 5/09 showed Vit D level = 31... rec- 1000 u Vit D OTC supplement.  DEPRESSION (ICD-311) - This has been her major problem over the years... several psyche hospitalizations and one OD admit... followed by DrSteiner every 2-3 months, on several meds & adjusted frequently... Currently on WELLBUTRIN XL 300,   LEXAPRO 20,  ZYPREXA 2.5 (taking 0.5 tabs daily), &  LITHOBID 300. ~  9/10:  she reports doing very well on her regimen- stable w/o acute problems. ~  3/11:  reports incr depressive symptoms- meds adjusted & Deplin added by DrSteiner. ~  11/11: reports stable on her 5 med regimen... she & husb continue to care for their 38 y/o son w/ CP. ~  5/12: she is currently on 4 meds from DrSteiner, husb ill, kids having to care for them + handicaped brother... ~  11/12: current meds listed as> WELLBUTRIN XL 300,  LEXAPRO 20,  ZYPREXA 2.5 (taking 1.5 tabs daily), &  LITHOBID incr to 2 tabs daily... ~  5/13:  Her meds have been adjusted & daugh says much improved> on WELLBUTRIN XL 300,  LEXAPRO 20,  ZYPREXA 2.5, &  LITHOBID 300mg /d. ~  12/13: followed by DrSteiner  for Psychiatry on  Lethium300, Wellbutrin300, Lexapro20, Zyprexa2.5-1/2tab; she has been stable on these meds & doing well overall...  ~  6/14: hx severe depression; followed by DrSteiner for Psychiatry on  Lithium300, Wellbutrin300, Lexapro20, Zyprexa2.5-1/2tab; she has been stable on these meds & doing well overall... ~  12/14: she remains under the care of DrSteiner for Psychiatry on  Lithium300, Wellbutrin300, Lexapro20- 1/4 tab, Zyprexa2.5-1/2tab...  HEALTH MAINTENANCE: ~  GI:  Followed by DrStark w/ colon 2010 as above... ~  GYN:   ~  Immunizations:  She gets the yearly Flu vaccine;  She had Pneumovax prev;  Given TDAP 11/12 in office...   Past Surgical History  Procedure Laterality Date  . Tonsillectomy      Outpatient Encounter Prescriptions as of 11/12/2013  Medication Sig  . buPROPion (WELLBUTRIN XL) 300 MG 24 hr tablet Take 300 mg by mouth daily.    . dabigatran (PRADAXA) 75 MG CAPS capsule Take 75 mg by mouth 2 (two) times daily.  Marland Kitchen escitalopram (LEXAPRO) 20 MG tablet Take 20 mg by mouth daily.   . furosemide (LASIX) 20 MG tablet Take 2 tablets (40 mg total) by mouth daily.  Marland Kitchen ipratropium-albuterol (DUONEB) 0.5-2.5 (3) MG/3ML  SOLN Take 3 mLs by nebulization 3 (three) times daily as needed (for wheezing or shortness of breath).  Marland Kitchen levothyroxine (SYNTHROID, LEVOTHROID) 112 MCG tablet Take 1 tablet (112 mcg total) by mouth daily before breakfast.  . lithium carbonate (LITHOBID) 300 MG CR tablet Take 300 mg by mouth daily.  . metoprolol tartrate (LOPRESSOR) 25 MG tablet Take 25 mg by mouth 2 (two) times daily.  Marland Kitchen OLANZapine (ZYPREXA) 2.5 MG tablet Take 2.5 mg by mouth daily.   . potassium chloride SA (K-DUR,KLOR-CON) 20 MEQ tablet Take 20 mEq by mouth daily.   . predniSONE (DELTASONE) 10 MG tablet 1 tab(10mg ) for 2 days.  . simvastatin (ZOCOR) 20 MG tablet Take 20 mg by mouth daily.  . [DISCONTINUED] predniSONE (DELTASONE) 10 MG tablet Take 4 tabs (40 mg) for 2 days, then 3 tabs (30mg ) for 2 days, then 2 tabs (20mg ) for 2 days, then 1 tab(10mg ) for 2 days.  . [DISCONTINUED] levofloxacin (LEVAQUIN) 750 MG tablet Take 1 tablet (750 mg total) by mouth every other day. With doses on 2/7, 2/9, and last dose on 2/11    No Known Allergies   Current Medications, Allergies, Past Medical History, Past Surgical History, Family History, and Social History were reviewed in [employer] record.    Review of Systems         See HPI - all other systems neg except as noted... The patient complains of SOB/ DOE, & depression.  The patient denies anorexia, fever, weight loss, weight gain, vision loss, decreased hearing, hoarseness, chest pain, syncope, peripheral edema, prolonged cough, headaches, hemoptysis, abdominal pain, melena, hematochezia, severe indigestion/heartburn, hematuria, incontinence, muscle weakness, suspicious skin lesions, transient blindness, difficulty walking, unusual weight change, abnormal bleeding, enlarged lymph nodes, and angioedema.     Objective:   Physical Exam     WD, Overweight, 80 y/o WF in NAD... she has a flat affect... Vital Signs:  Reviewed... GENERAL:  Alert & oriented;  pleasant & cooperative... HEENT:  [place]/AT, EOM-wnl, PERRLA, EACs-clear, TMs-wnl, NOSE-clear, THROAT-clear & wnl. NECK:  Supple w/ fairROM; no JVD; normal carotid impulses w/o bruits; no thyromegaly or nodules palpated; no lymphadenopathy. CHEST:  Clear to P & A; without wheezes/ rales/ or rhonchi heard... HEART:  Irreg rhythm w/ variable VR; without murmurs/  rubs/ or gallops detected... ABDOMEN:  Obese, soft & nontender; normal bowel sounds; no organomegaly or masses palpated... EXT: without deformities, mild arthritic changes; no varicose veins/ +venous insuffic/ no edema. NEURO:  CN's intact;  no focal neuro deficits... DERM:  No lesions noted; no rash etc..Marland Kitchen

## 2013-11-12 NOTE — Assessment & Plan Note (Signed)
Appears compensated and euvolemic  Recent decompensation with Afib w/ RVR /acute illness now improved back to baseline w/ diuresis.

## 2013-11-12 NOTE — Patient Instructions (Signed)
Continue on current regimen  Labs today  Follow up Dr. Lenna Gilford  In 4-6 weeks and As needed   Please contact office for sooner follow up if symptoms do not improve or worsen or seek emergency care

## 2013-11-19 ENCOUNTER — Other Ambulatory Visit: Payer: Self-pay | Admitting: Internal Medicine

## 2013-12-06 ENCOUNTER — Telehealth: Payer: Self-pay | Admitting: *Deleted

## 2013-12-06 NOTE — Telephone Encounter (Signed)
Patient requests pradaxa samples. I will place them at the front desk.

## 2013-12-10 ENCOUNTER — Other Ambulatory Visit: Payer: Self-pay | Admitting: Internal Medicine

## 2013-12-20 ENCOUNTER — Encounter: Payer: Self-pay | Admitting: Adult Health

## 2013-12-20 ENCOUNTER — Ambulatory Visit (INDEPENDENT_AMBULATORY_CARE_PROVIDER_SITE_OTHER): Payer: Medicare Other | Admitting: Adult Health

## 2013-12-20 VITALS — BP 128/74 | HR 76 | Temp 98.0°F | Ht 67.0 in | Wt 208.0 lb

## 2013-12-20 DIAGNOSIS — F3289 Other specified depressive episodes: Secondary | ICD-10-CM

## 2013-12-20 DIAGNOSIS — M949 Disorder of cartilage, unspecified: Secondary | ICD-10-CM

## 2013-12-20 DIAGNOSIS — E039 Hypothyroidism, unspecified: Secondary | ICD-10-CM

## 2013-12-20 DIAGNOSIS — J209 Acute bronchitis, unspecified: Secondary | ICD-10-CM

## 2013-12-20 DIAGNOSIS — E663 Overweight: Secondary | ICD-10-CM

## 2013-12-20 DIAGNOSIS — M1990 Unspecified osteoarthritis, unspecified site: Secondary | ICD-10-CM

## 2013-12-20 DIAGNOSIS — N189 Chronic kidney disease, unspecified: Secondary | ICD-10-CM

## 2013-12-20 DIAGNOSIS — N289 Disorder of kidney and ureter, unspecified: Secondary | ICD-10-CM

## 2013-12-20 DIAGNOSIS — F329 Major depressive disorder, single episode, unspecified: Secondary | ICD-10-CM

## 2013-12-20 DIAGNOSIS — M899 Disorder of bone, unspecified: Secondary | ICD-10-CM

## 2013-12-20 DIAGNOSIS — E785 Hyperlipidemia, unspecified: Secondary | ICD-10-CM

## 2013-12-20 NOTE — Assessment & Plan Note (Signed)
Recent bump in scr with diuresis during hospital , tr down on labs Now at 1.8 cont to monitor  May need referral to nephology in future continues to climb.

## 2013-12-20 NOTE — Progress Notes (Signed)
Subjective:    Patient ID: Sonya Kaufman, female    DOB: Jul 06, 1931, 80 y.o.   MRN: [id number]  HPI 80 y/o WFmult med problems as noted below... she is followed by DrSteiner for Psychiatry w/ severe depression   11/12/13  [facility] follow up  Patient returns for a post hospital followup. Patient was admitted February 4 through February 6 for AEAB (never smoker) , exacerbation, with acute on chronic respiratory failure. Flu PCR was negative. She was treated with IV antibiotics, steroids, and nebulized bronchodilators. She was discharged on Levaquin and a steroid taper. Patient did have atrial filb with RVR. Her metoprolol was increased to 50 mg twice daily. She was continued on her chronic anticoagulation with Pradaxa .  Has finished levaquin . Has 2 days left of prednisone.   Patient did have signs of fluid overload and was diuresed. The clinical improvement. She is chronic renal insufficiency. Serum creatinine did bump after diuresis. Since discharge. Patient reports that she is feeling improved with decreased shortness of breath and cough. Patient denies any hemoptysis, fever, orthopnea, PND, or leg swelling. Feels she is improving and close to her baseline.  TSH hyper, synthroid was adjusted to 167mcg.  >>no changes  12/20/2013 Follow up Returns for 1 month follow up .  Reports is doing well, no new complaints. Feels her breathing is back to baseline.  Was recently admitted with AEAB and fluid overload.  Labs last ov showed renal fxn holding at 1.8 , no sign change.  Denies chest pain, orthopnea or edema.           Problem List:  ASTHMATIC BRONCHITIS, ACUTE (ICD-466.0) - on PROAIR as needed; she is a non-smoker> denies cough, sputum, hemoptysis, worsening dyspnea,  wheezing, chest pains, snoring, daytime hypersomnolence, etc...  ~  CXR 11/11 showed cardiomeg, clear lungs, NAD.Marland Kitchen. ~  CXR 3/12 showed cardiomeg, vasc congestion, mild interst edema> c/w mild CHF... ~  CXR 2/13 showed  mild cardiomegaly, mild vasc congestion, mild basilar atx [facility] for CHF). ~  CTAngio 2/13 showed no evid PE, cardiomeg, CHF/ mild pulm edema, sm effusions...  ACUTE ON CHRONIC DIASTOLIC HEART FAILURE> on METOPROLOL ER 50mg - 1.5tabsBid, LASIX 20mg - 2Bid, KCl 64mEq Bid,and PRADAXA 75mg  bid... ATRIAL FIBRILLATION & ATRIAL FLUTTER - new onset noted 11/11 OV & pt asymptomatic w/o CP, palpit, dizzy, SOB, etc> referred to DrAllred. ~  She has had freq med adjustments in the interval by Cards/ DrAllred> on & off Cardizem, up & down on Pradaxa Loss adjuster, chartered & Epic reviewed). ~  5/12:  They report current meds: METOPROLOL 25mg - 2Bid, LASIX 20mg - 2AM & 1PM, KCl 66mEq Bid,and PRADAXA 150mg  bid. ~  11/12:  She had recent check up w/ DrAllred & meds now listed> METOPROLOL ER 50mg Bid, LASIX 20mg - 2AM & 1PM, KCl 36mEq Bid,and PRADAXA 75mg  bid. ~  2/13:  Hosp for acute on chronic diastolic CHF & AFib- managed by DrAllred & meds adjusted as noted above; office f/u 4/13 & stable... ~  12/13: on Pradaxa75Bid, MetopER25Bid, Lasix20-2Bid, K20Bid; BP=132/68 & she denies CP, palpit, ch in DOE, edema; f/u DrAllred 9/13- stable, no changes. ~  6/14: on Pradaxa75Bid, MetopER25Bid, Lasix20-2Bid, K20Bid; BP=126/74 & she denies CP, palpit, ch in DOE, edema; she saw DrAllred 6/14- and he decr her Lasix20 from 4/d to 2/d... ~  12/14: on Pradaxa75Bid,  MetopER25Bid, Lasix20- now on 2Qam & 1Qpm, K20Bid; BP=128/68 & she remains largely asymptomatic...   CEREBROVASCULAR DISEASE (ICD-437.9) - on PRADAXA 75mg  Bid...  ~  prev eval by  DrLove in 2003... CT showed bilat sm vessel ischemic disease... prev on ASA 81mg /d... She denies any cerebral ischemic symptoms... ~  She remains stable on the Pradaxa w/o cerebral ischemic symptoms...  VENOUS INSUFFICIENCY (ICD-459.81) - she takes LASIX as above & follows a low sodium diet and uses support hose as needed...  HYPERLIPIDEMIA (ICD-272.4) - on SIMVASTATIN 20mg /d + FishOil 1000mg /d... ~  [place]  11/07 showed TChol 140, Tg 90, HDL 52, LDL 70... ~  [place] 5/09 showed TChol 181, TG 105, HDL 51, LDL 110... rec- same med, better diet for now. ~  [place] 9/10 showed TChol 174, TG 129, HDL 63, LDL 86 ~  She needs to return FASTING for FLP recheck... ~  [place] 3/12 showed TChol 121, TG 58, HDL 57, LDL 52... Note LFTs sl elev w/ SGOT 67, SGPT 74... ~  [place] 6/13 on Simva20 showed TChol 141, TG 103, HDL 52, LDL 69... Note LFTs are wnl now... ~  [place] 3/14 on simva20 showed TChol 151, TG 112, HDL 48, LDL 80   HYPOTHYROIDISM (ICD-244.9) - currently on SYNTHROID 158mcg/d... ~  prev TSH 7/08 was 3.56 ~  labs 5/09 showed TSH= 2.06 ~  labs 9/10 showed TSH= 2.30 ~  labs 3/11 showed TSH= 3.13, FreeT3= 2.4 (2.3-4.2), FreeT4= 0.9 (0.6-1.6)... rec incr to 18mcg/d. ~  labs 11/11 on Levoth125 showed TSH= 0.92... keep same for now. ~  During the 2/12 hosp she was cut back to 125mcg/d dose & she says she feels better on this dose. ~  Labs 3/12 showed TSH= 5.80 & reminded to take med every day... ~  Labs 10/12 showed TSH= 0.62... We have dose listed as 163mcg/d. ~  Labs 2/13 in [place] showed TSH= 1.16 on Synthroid180mcg/d now... ~  Labs 3/14 on Synthroid125 showed TSH= 0.96  OVERWEIGHT (ICD-278.02) - diet + exercise program discussed... ~  weight 5/09 = 206# ~  weight 9/10 = 206# ~  weight 3/11 = 213# ~  weight 11/11 = 209# ~  Weight 5/12 = 206# ~  Weight 11/12 = 207# ~  Weight 5/13 = 205# ~  Weight 12/13 = 208# ~  Weight 6/14 = 208# ~  Weight 12/14 = 211#  DIVERTICULOSIS OF COLON (ICD-562.10) COLONIC POLYPS (ICD-211.3) - she had routine colonoscopy 10/10 by DrStark showing several 4-61mm polyps & mild divertics- path showed one tubular adenoma & f/u planned 25yrs.  OSTEOARTHRITIS (ICD-715.90) - hx knee pain in past, now c/o intermiitent right hip pain... she never filled Mobic Rx & uses OTC pain meds Prn.  OSTEOPENIA (ICD-733.90) - I can't find prev BMD in the system... takes Calcium, MVI, Vit D... ~  labs 5/09  showed Vit D level = 31... rec- 1000 u Vit D OTC supplement.  DEPRESSION (ICD-311) - This has been her major problem over the years... several psyche hospitalizations and one OD admit... followed by DrSteiner every 2-3 months, on several meds & adjusted frequently... Currently on WELLBUTRIN XL 300,  LEXAPRO 20,  ZYPREXA 2.5 (taking 0.5 tabs daily), &  LITHOBID 300. ~  9/10:  she reports doing very well on her regimen- stable w/o acute problems. ~  3/11:  reports incr depressive symptoms- meds adjusted & Deplin added by DrSteiner. ~  11/11: reports stable on her 5 med regimen... she & husb continue to care for their 71 y/o son w/ CP. ~  5/12: she is currently on 4 meds from DrSteiner, husb ill, kids having to care for them + handicaped brother... ~  11/12:  current meds listed as> WELLBUTRIN XL 300,  LEXAPRO 20,  ZYPREXA 2.5 (taking 1.5 tabs daily), &  LITHOBID incr to 2 tabs daily... ~  5/13:  Her meds have been adjusted & daugh says much improved> on WELLBUTRIN XL 300,  LEXAPRO 20,  ZYPREXA 2.5, &  LITHOBID 300mg /d. ~  12/13: followed by DrSteiner for Psychiatry on  Lethium300, Wellbutrin300, Lexapro20, Zyprexa2.5-1/2tab; she has been stable on these meds & doing well overall...  ~  6/14: hx severe depression; followed by DrSteiner for Psychiatry on  Lithium300, Wellbutrin300, Lexapro20, Zyprexa2.5-1/2tab; she has been stable on these meds & doing well overall... ~  12/14: she remains under the care of DrSteiner for Psychiatry on  Lithium300, Wellbutrin300, Lexapro20- 1/4 tab, Zyprexa2.5-1/2tab...  HEALTH MAINTENANCE: ~  GI:  Followed by DrStark w/ colon 2010 as above... ~  GYN:   ~  Immunizations:  She gets the yearly Flu vaccine;  She had Pneumovax prev;  Given TDAP 11/12 in office...   Past Surgical History  Procedure Laterality Date  . Tonsillectomy      Outpatient Encounter Prescriptions as of 12/20/2013  Medication Sig  . buPROPion (WELLBUTRIN XL) 300 MG 24 hr tablet Take 300 mg by  mouth daily.    . dabigatran (PRADAXA) 75 MG CAPS capsule Take 75 mg by mouth 2 (two) times daily.  Marland Kitchen escitalopram (LEXAPRO) 20 MG tablet Take 20 mg by mouth daily.   . furosemide (LASIX) 20 MG tablet TAKE ONE TABLET BY MOUTH THREE TIMES DAILY AS NEEDED FOR WEIGHT GAIN OF 2-3 POUNDS  . ipratropium-albuterol (DUONEB) 0.5-2.5 (3) MG/3ML SOLN Take 3 mLs by nebulization 3 (three) times daily as needed (for wheezing or shortness of breath).  Marland Kitchen levothyroxine (SYNTHROID, LEVOTHROID) 112 MCG tablet Take 1 tablet (112 mcg total) by mouth daily before breakfast.  . lithium carbonate (LITHOBID) 300 MG CR tablet Take 300 mg by mouth daily.  . metoprolol tartrate (LOPRESSOR) 25 MG tablet Take 25 mg by mouth 2 (two) times daily.  Marland Kitchen OLANZapine (ZYPREXA) 2.5 MG tablet Take 2.5 mg by mouth daily.   . potassium chloride SA (KLOR-CON M20) 20 MEQ tablet Take 1 tablet (20 mEq total) by mouth daily.  . simvastatin (ZOCOR) 20 MG tablet Take 20 mg by mouth daily.  . [DISCONTINUED] potassium chloride SA (K-DUR,KLOR-CON) 20 MEQ tablet Take 20 mEq by mouth daily.   . [DISCONTINUED] predniSONE (DELTASONE) 10 MG tablet 1 tab(10mg ) for 2 days.    No Known Allergies   Current Medications, Allergies, Past Medical History, Past Surgical History, Family History, and Social History were reviewed in [employer] record.    Review of Systems         See HPI - all other systems neg except as noted... The patient complains of SOB/ DOE, & depression.  The patient denies anorexia, fever, weight loss, weight gain, vision loss, decreased hearing, hoarseness, chest pain, syncope, peripheral edema, prolonged cough, headaches, hemoptysis, abdominal pain, melena, hematochezia, severe indigestion/heartburn, hematuria, incontinence, muscle weakness, suspicious skin lesions, transient blindness, difficulty walking, unusual weight change, abnormal bleeding, enlarged lymph nodes, and angioedema.     Objective:    Physical Exam     WD, Overweight, 80 y/o WF in NAD... she has a flat affect... Vital Signs:  Reviewed... GENERAL:  Alert & oriented; pleasant & cooperative... HEENT:  [place]/AT, EOM-wnl, PERRLA, EACs-clear, TMs-wnl, NOSE-clear, THROAT-clear & wnl. NECK:  Supple w/ fairROM; no JVD; normal carotid impulses w/o bruits; no thyromegaly or nodules palpated; no lymphadenopathy.  CHEST:  Clear to P & A; without wheezes/ rales/ or rhonchi heard... HEART:  Irreg rhythm w/ variable VR; without murmurs/ rubs/ or gallops detected... ABDOMEN:  Obese, soft & nontender; normal bowel sounds; no organomegaly or masses palpated... EXT: without deformities, mild arthritic changes; no varicose veins/ +venous insuffic/ no edema. NEURO:  CN's intact;  no focal neuro deficits... DERM:  No lesions noted; no rash etc..Marland Kitchen

## 2013-12-20 NOTE — Assessment & Plan Note (Signed)
Controlled on rx   

## 2013-12-20 NOTE — Assessment & Plan Note (Signed)
Recent flare now resolved  follow up As needed.

## 2013-12-20 NOTE — Patient Instructions (Addendum)
Continue on current regimen  Dr. Lenna Gilford is retiring and we will need to set you up with a new Primary Care provider.  Please let us know if you would like [place] to assist with this referral.     >pt transferring to Dr. Virgina Jock at [facility]

## 2013-12-27 ENCOUNTER — Encounter: Payer: Self-pay | Admitting: Internal Medicine

## 2013-12-29 ENCOUNTER — Other Ambulatory Visit (HOSPITAL_COMMUNITY): Payer: Self-pay | Admitting: Internal Medicine

## 2014-02-23 ENCOUNTER — Other Ambulatory Visit: Payer: Self-pay | Admitting: Internal Medicine

## 2014-03-09 ENCOUNTER — Encounter: Payer: Self-pay | Admitting: Internal Medicine

## 2014-03-09 ENCOUNTER — Ambulatory Visit (INDEPENDENT_AMBULATORY_CARE_PROVIDER_SITE_OTHER): Payer: Medicare Other | Admitting: Internal Medicine

## 2014-03-09 ENCOUNTER — Ambulatory Visit: Payer: Medicare Other | Admitting: Pulmonary Disease

## 2014-03-09 VITALS — BP 110/64 | HR 71 | Ht 67.0 in | Wt 207.0 lb

## 2014-03-09 DIAGNOSIS — I4891 Unspecified atrial fibrillation: Secondary | ICD-10-CM

## 2014-03-09 DIAGNOSIS — I5032 Chronic diastolic (congestive) heart failure: Secondary | ICD-10-CM

## 2014-03-09 NOTE — Progress Notes (Signed)
PCP: Noralee Space, MD  Sonya Kaufman is a 80 y.o. female who presents today for routine cardiology followup.  Since last being seen in our clinic, the patient reports doing very well. She was hospitalized with URI in February but has done well since. She continues to try to be active.  She goes shopping every Friday.  Today, she denies symptoms of palpitations, chest pain, shortness of breath,  lower extremity edema, dizziness, presyncope, or syncope.  The patient is otherwise without complaint today.   Past Medical History  Diagnosis Date  . Diastolic CHF, chronic   . Other and unspecified hyperlipidemia     EF 60-65% by echo 11/2011  . Unspecified hypothyroidism   . Overweight   . Diverticulosis of colon (without mention of hemorrhage)   . Benign neoplasm of colon   . Osteoarthrosis, unspecified whether generalized or localized, unspecified site   . Disorder of bone and cartilage, unspecified   . Depressive disorder, not elsewhere classified   . Venous insufficiency   . Renal insufficiency   . Paroxysmal atrial fibrillation   . Atrial flutter   . Tachycardia-bradycardia    Past Surgical History  Procedure Laterality Date  . Tonsillectomy      Current Outpatient Prescriptions  Medication Sig Dispense Refill  . buPROPion (WELLBUTRIN XL) 300 MG 24 hr tablet Take 300 mg by mouth daily.        . dabigatran (PRADAXA) 75 MG CAPS capsule Take 75 mg by mouth 2 (two) times daily.      Marland Kitchen escitalopram (LEXAPRO) 20 MG tablet Take 20 mg by mouth daily.       . furosemide (LASIX) 20 MG tablet Take 40 mg in the morning and take 20 mg in the evening. May take an additional tablet if needed.      Marland Kitchen ipratropium-albuterol (DUONEB) 0.5-2.5 (3) MG/3ML SOLN Take 3 mLs by nebulization 3 (three) times daily as needed (for wheezing or shortness of breath).  360 mL  1  . levothyroxine (SYNTHROID, LEVOTHROID) 112 MCG tablet Take 1 tablet (112 mcg total) by mouth daily before breakfast.  30 tablet  1  .  lithium carbonate (LITHOBID) 300 MG CR tablet Take 300 mg by mouth daily.      . metoprolol succinate (TOPROL-XL) 25 MG 24 hr tablet TAKE ONE TABLET BY MOUTH TWICE DAILY  60 tablet  0  . OLANZapine (ZYPREXA) 2.5 MG tablet Take 1.25 mg by mouth daily.       . potassium chloride SA (KLOR-CON M20) 20 MEQ tablet Take 1 tablet (20 mEq total) by mouth daily.  30 tablet  3  . simvastatin (ZOCOR) 20 MG tablet Take 20 mg by mouth daily.       No current facility-administered medications for this visit.   ROS- all systems are reviewed and negative except as per HPI above  Physical Exam: Filed Vitals:   03/09/14 1154  BP: 110/64  Pulse: 71  Height: 5\' 7"  (1.702 m)  Weight: 207 lb (93.895 kg)    GEN- The patient is well appearing, alert and oriented x 3 today.   Head- normocephalic, atraumatic Eyes-  Sclera clear, conjunctiva pink Ears- hearing intact Oropharynx- clear Lungs- Clear to ausculation bilaterally, normal work of breathing Heart- irregular rate and rhythm, no murmurs, rubs or gallops, PMI not laterally displaced GI- soft, NT, ND, + BS Extremities- no clubbing, cyanosis, or edema  ekg today reveals afib, V rate 71 bpm  Assessment and Plan:  Atrial fibrillation  She  is in afib today but asymptomatic Continue rate control Anticoagulated with pradaxa 2/15 labs are reviewed  Chronic diastolic heart failure   euvolemic today  Continue daily weights  2g sodium restriction  Bradycardia  resolved  Return in 6 months

## 2014-03-09 NOTE — Patient Instructions (Signed)
Your physician wants you to follow-up in: 6 months with Dr. Allred. You will receive a reminder letter in the mail two months in advance. If you don't receive a letter, please call our office to schedule the follow-up appointment.  

## 2014-03-23 ENCOUNTER — Other Ambulatory Visit: Payer: Self-pay | Admitting: Internal Medicine

## 2014-04-02 ENCOUNTER — Emergency Department (HOSPITAL_COMMUNITY): Payer: Medicare Other

## 2014-04-02 ENCOUNTER — Observation Stay (HOSPITAL_COMMUNITY)
Admission: EM | Admit: 2014-04-02 | Discharge: 2014-04-03 | Disposition: A | Discharge status: Home / Self Care | Payer: Medicare Other | Attending: Internal Medicine | Admitting: Internal Medicine

## 2014-04-02 ENCOUNTER — Encounter (HOSPITAL_COMMUNITY): Payer: Self-pay | Admitting: Emergency Medicine

## 2014-04-02 ENCOUNTER — Telehealth: Payer: Self-pay | Admitting: Physician Assistant

## 2014-04-02 DIAGNOSIS — R0789 Other chest pain: Principal | ICD-10-CM | POA: Insufficient documentation

## 2014-04-02 DIAGNOSIS — Z860100 Personal history of colon polyps, unspecified: Secondary | ICD-10-CM | POA: Insufficient documentation

## 2014-04-02 DIAGNOSIS — R079 Chest pain, unspecified: Secondary | ICD-10-CM

## 2014-04-02 DIAGNOSIS — I4892 Unspecified atrial flutter: Secondary | ICD-10-CM | POA: Insufficient documentation

## 2014-04-02 DIAGNOSIS — F3289 Other specified depressive episodes: Secondary | ICD-10-CM | POA: Insufficient documentation

## 2014-04-02 DIAGNOSIS — E039 Hypothyroidism, unspecified: Secondary | ICD-10-CM | POA: Insufficient documentation

## 2014-04-02 DIAGNOSIS — M899 Disorder of bone, unspecified: Secondary | ICD-10-CM | POA: Insufficient documentation

## 2014-04-02 DIAGNOSIS — I5032 Chronic diastolic (congestive) heart failure: Secondary | ICD-10-CM | POA: Insufficient documentation

## 2014-04-02 DIAGNOSIS — K5730 Diverticulosis of large intestine without perforation or abscess without bleeding: Secondary | ICD-10-CM | POA: Insufficient documentation

## 2014-04-02 DIAGNOSIS — M1990 Unspecified osteoarthritis, unspecified site: Secondary | ICD-10-CM | POA: Insufficient documentation

## 2014-04-02 DIAGNOSIS — I4821 Permanent atrial fibrillation: Secondary | ICD-10-CM | POA: Diagnosis present

## 2014-04-02 DIAGNOSIS — I872 Venous insufficiency (chronic) (peripheral): Secondary | ICD-10-CM | POA: Insufficient documentation

## 2014-04-02 DIAGNOSIS — M949 Disorder of cartilage, unspecified: Secondary | ICD-10-CM

## 2014-04-02 DIAGNOSIS — I4891 Unspecified atrial fibrillation: Secondary | ICD-10-CM | POA: Insufficient documentation

## 2014-04-02 DIAGNOSIS — N289 Disorder of kidney and ureter, unspecified: Secondary | ICD-10-CM | POA: Insufficient documentation

## 2014-04-02 DIAGNOSIS — R072 Precordial pain: Secondary | ICD-10-CM | POA: Diagnosis present

## 2014-04-02 DIAGNOSIS — I495 Sick sinus syndrome: Secondary | ICD-10-CM | POA: Insufficient documentation

## 2014-04-02 DIAGNOSIS — E785 Hyperlipidemia, unspecified: Secondary | ICD-10-CM | POA: Insufficient documentation

## 2014-04-02 DIAGNOSIS — F329 Major depressive disorder, single episode, unspecified: Secondary | ICD-10-CM | POA: Insufficient documentation

## 2014-04-02 DIAGNOSIS — E663 Overweight: Secondary | ICD-10-CM | POA: Insufficient documentation

## 2014-04-02 DIAGNOSIS — Z79899 Other long term (current) drug therapy: Secondary | ICD-10-CM | POA: Insufficient documentation

## 2014-04-02 LAB — CBC WITH DIFFERENTIAL/PLATELET
Basophils Absolute: 0 10*3/uL (ref 0.0–0.1)
Basophils Relative: 0 % (ref 0–1)
EOS ABS: 0.1 10*3/uL (ref 0.0–0.7)
EOS PCT: 2 % (ref 0–5)
HCT: 45.5 % (ref 36.0–46.0)
Hemoglobin: 14.3 g/dL (ref 12.0–15.0)
LYMPHS ABS: 2 10*3/uL (ref 0.7–4.0)
Lymphocytes Relative: 37 % (ref 12–46)
MCH: 30 pg (ref 26.0–34.0)
MCHC: 31.4 g/dL (ref 30.0–36.0)
MCV: 95.6 fL (ref 78.0–100.0)
MONO ABS: 0.4 10*3/uL (ref 0.1–1.0)
Monocytes Relative: 8 % (ref 3–12)
Neutro Abs: 2.9 10*3/uL (ref 1.7–7.7)
Neutrophils Relative %: 53 % (ref 43–77)
PLATELETS: 190 10*3/uL (ref 150–400)
RBC: 4.76 MIL/uL (ref 3.87–5.11)
RDW: 13.4 % (ref 11.5–15.5)
WBC: 5.5 10*3/uL (ref 4.0–10.5)

## 2014-04-02 LAB — BASIC METABOLIC PANEL
Anion gap: 12 (ref 5–15)
BUN: 18 mg/dL (ref 6–23)
CALCIUM: 9.2 mg/dL (ref 8.4–10.5)
CO2: 28 mEq/L (ref 19–32)
CREATININE: 1.6 mg/dL — AB (ref 0.50–1.10)
Chloride: 104 mEq/L (ref 96–112)
GFR calc non Af Amer: 29 mL/min — ABNORMAL LOW (ref >90)
GFR, EST AFRICAN AMERICAN: 34 mL/min — AB (ref >90)
Glucose, Bld: 92 mg/dL (ref 70–99)
Potassium: 3.9 mEq/L (ref 3.7–5.3)
Sodium: 144 mEq/L (ref 137–147)

## 2014-04-02 LAB — HEPATIC FUNCTION PANEL
ALT: 14 U/L (ref 0–35)
AST: 16 U/L (ref 0–37)
Albumin: 3.4 g/dL — ABNORMAL LOW (ref 3.5–5.2)
Alkaline Phosphatase: 83 U/L (ref 39–117)
Bilirubin, Direct: <0.2 mg/dL (ref 0.0–0.3)
TOTAL PROTEIN: 6.4 g/dL (ref 6.0–8.3)
Total Bilirubin: 0.2 mg/dL — ABNORMAL LOW (ref 0.3–1.2)

## 2014-04-02 LAB — TROPONIN I
Troponin I: <0.3 ng/mL (ref <0.30)
Troponin I: <0.3 ng/mL (ref <0.30)

## 2014-04-02 LAB — I-STAT TROPONIN, ED: Troponin i, poc: 0 ng/mL (ref 0.00–0.08)

## 2014-04-02 MED ORDER — LEVOTHYROXINE SODIUM 125 MCG PO TABS
125.0000 ug | ORAL_TABLET | Freq: Every day | ORAL | Status: DC
  Filled 2014-04-02 (×2): qty 1

## 2014-04-02 MED ORDER — ACETAMINOPHEN 325 MG PO TABS: 650.0000 mg | ORAL_TABLET | ORAL | Status: DC | PRN

## 2014-04-02 MED ORDER — FUROSEMIDE 20 MG PO TABS
20.0000 mg | ORAL_TABLET | Freq: Two times a day (BID) | ORAL | Status: DC
Start: 2014-04-02 — End: 2014-04-02
  Filled 2014-04-02 (×2): qty 2

## 2014-04-02 MED ORDER — OLANZAPINE 2.5 MG PO TABS
2.5000 mg | ORAL_TABLET | Freq: Every day | ORAL | Status: DC
  Administered 2014-04-02: 2.5 mg via ORAL
  Filled 2014-04-02 (×2): qty 1

## 2014-04-02 MED ORDER — SIMVASTATIN 20 MG PO TABS
20.0000 mg | ORAL_TABLET | Freq: Every day | ORAL | Status: DC
  Administered 2014-04-02: 20 mg via ORAL
  Filled 2014-04-02 (×2): qty 1

## 2014-04-02 MED ORDER — POTASSIUM CHLORIDE CRYS ER 20 MEQ PO TBCR
20.0000 meq | EXTENDED_RELEASE_TABLET | Freq: Every day | ORAL | Status: DC
  Administered 2014-04-02: 20 meq via ORAL
  Filled 2014-04-02 (×2): qty 1

## 2014-04-02 MED ORDER — ESCITALOPRAM OXALATE 20 MG PO TABS: 20.0000 mg | ORAL_TABLET | Freq: Every day | ORAL | Status: DC

## 2014-04-02 MED ORDER — ESCITALOPRAM OXALATE 20 MG PO TABS
20.0000 mg | ORAL_TABLET | Freq: Every day | ORAL | Status: DC
  Filled 2014-04-02: qty 1

## 2014-04-02 MED ORDER — LITHIUM CARBONATE ER 300 MG PO TBCR
300.0000 mg | EXTENDED_RELEASE_TABLET | Freq: Every day | ORAL | Status: DC
  Administered 2014-04-02: 300 mg via ORAL
  Filled 2014-04-02 (×2): qty 1

## 2014-04-02 MED ORDER — METOPROLOL SUCCINATE ER 25 MG PO TB24
25.0000 mg | ORAL_TABLET | Freq: Two times a day (BID) | ORAL | Status: DC
  Administered 2014-04-02: 25 mg via ORAL
  Filled 2014-04-02 (×3): qty 1

## 2014-04-02 MED ORDER — ALPRAZOLAM 0.25 MG PO TABS: 0.2500 mg | ORAL_TABLET | Freq: Two times a day (BID) | ORAL | Status: DC | PRN

## 2014-04-02 MED ORDER — FUROSEMIDE 10 MG/ML IJ SOLN
40.0000 mg | Freq: Once | INTRAMUSCULAR | Status: AC
  Administered 2014-04-02: 40 mg via INTRAVENOUS
  Filled 2014-04-02: qty 4

## 2014-04-02 MED ORDER — FUROSEMIDE 40 MG PO TABS
40.0000 mg | ORAL_TABLET | Freq: Every day | ORAL | Status: DC
  Filled 2014-04-02 (×2): qty 1

## 2014-04-02 MED ORDER — ONDANSETRON HCL 4 MG/2ML IJ SOLN: 4.0000 mg | Freq: Four times a day (QID) | INTRAMUSCULAR | Status: DC | PRN

## 2014-04-02 MED ORDER — ZOLPIDEM TARTRATE 5 MG PO TABS: 5.0000 mg | ORAL_TABLET | Freq: Every evening | ORAL | Status: DC | PRN

## 2014-04-02 MED ORDER — BUPROPION HCL ER (XL) 300 MG PO TB24
300.0000 mg | ORAL_TABLET | Freq: Every day | ORAL | Status: DC
  Filled 2014-04-02: qty 1

## 2014-04-02 MED ORDER — DABIGATRAN ETEXILATE MESYLATE 75 MG PO CAPS
75.0000 mg | ORAL_CAPSULE | Freq: Two times a day (BID) | ORAL | Status: DC
  Administered 2014-04-02: 75 mg via ORAL
  Filled 2014-04-02 (×3): qty 1

## 2014-04-02 MED ORDER — FUROSEMIDE 20 MG PO TABS
20.0000 mg | ORAL_TABLET | Freq: Every day | ORAL | Status: DC
  Filled 2014-04-02 (×2): qty 1

## 2014-04-02 MED ORDER — ENOXAPARIN SODIUM 40 MG/0.4ML ~~LOC~~ SOLN: 40.0000 mg | SUBCUTANEOUS | Status: DC

## 2014-04-02 NOTE — Telephone Encounter (Signed)
Ms. Sonya Kaufman called because her mother, who has a history of atrial fibrillation but not coronary artery disease, was having left-sided chest pain.  Upon further description of the pain, the symptoms seem to be exertional and were relieved by rest. Ms. Sonya Kaufman stated that when her mother was having the pain she appeared acutely uncomfortable. Up until now, she has refused evaluation, saying she would contact the office on Monday.  I advised Ms. Bell that her mother should be evaluated at the emergency room. I requested that she call 911 and bring her mother to [facility]. Ms Sonya Kaufman was not sure her mother would agree to this. I advised that her symptoms were concerning but as long as the pain was relieved by rest, if her mother refused transport, that is acceptable.

## 2014-04-02 NOTE — ED Notes (Signed)
Pt in via EMS c/o chest pain since Thursday, pain is intermittent, worse with movement or exertion, pain is reproducible with palpation to left lower rib area, pt reports shortness of breath with exertion but states that is normal for her with no recent changes, no distress noted at this time, denies pain at this time.

## 2014-04-02 NOTE — ED Provider Notes (Signed)
CSN: [id number]     Arrival date & time 04/02/14  1106 History   First MD Initiated Contact with Patient 04/02/14 1115     Chief Complaint  Patient presents with  . Chest Pain     (Consider location/radiation/quality/duration/timing/severity/associated sxs/prior Treatment) Patient is a 80 y.o. female presenting with chest pain. The history is provided by the patient (the pt complains of chest pain with exirtion).  Chest Pain Pain location:  L chest Pain quality: aching   Pain radiates to:  Does not radiate Pain radiates to the back: no   Pain severity:  Moderate Onset quality:  Gradual Timing:  Intermittent Progression:  Unchanged Chronicity:  New Context: not breathing   Associated symptoms: no abdominal pain, no back pain, no cough, no fatigue and no headache     Past Medical History  Diagnosis Date  . Diastolic CHF, chronic   . Other and unspecified hyperlipidemia     EF 60-65% by echo 11/2011  . Unspecified hypothyroidism   . Overweight(278.02)   . Diverticulosis of colon (without mention of hemorrhage)   . Benign neoplasm of colon   . Osteoarthrosis, unspecified whether generalized or localized, unspecified site   . Disorder of bone and cartilage, unspecified   . Depressive disorder, not elsewhere classified   . Venous insufficiency   . Renal insufficiency   . Paroxysmal atrial fibrillation   . Atrial flutter   . Tachycardia-bradycardia    Past Surgical History  Procedure Laterality Date  . Tonsillectomy     Family History  Problem Relation Age of Onset  . Heart failure Mother   . Arthritis Mother   . Heart disease Father   . Heart disease Other    History  Substance Use Topics  . Smoking status: Never Smoker   . Smokeless tobacco: Never Used  . Alcohol Use: No   OB History   Grav Para Term Preterm Abortions TAB SAB Ect Mult Living                 Review of Systems  Constitutional: Negative for appetite change and fatigue.  HENT: Negative for  congestion, ear discharge and sinus pressure.   Eyes: Negative for discharge.  Respiratory: Negative for cough.   Cardiovascular: Positive for chest pain.  Gastrointestinal: Negative for abdominal pain and diarrhea.  Genitourinary: Negative for frequency and hematuria.  Musculoskeletal: Negative for back pain.  Skin: Negative for rash.  Neurological: Negative for seizures and headaches.  Psychiatric/Behavioral: Negative for hallucinations.      Allergies  Review of patient's allergies indicates no known allergies.  Home Medications   Prior to Admission medications   Medication Sig Start Date End Date Taking? Authorizing Provider  buPROPion (WELLBUTRIN XL) 300 MG 24 hr tablet Take 300 mg by mouth daily.     Yes Historical Provider, MD  dabigatran (PRADAXA) 75 MG CAPS capsule Take 75 mg by mouth 2 (two) times daily.   Yes Historical Provider, MD  escitalopram (LEXAPRO) 20 MG tablet Take 20 mg by mouth daily.    Yes Historical Provider, MD  furosemide (LASIX) 20 MG tablet Take 40 mg in the morning and take 20 mg in the evening. May take an additional tablet if needed.   Yes Thompson Grayer, MD  levothyroxine (SYNTHROID, LEVOTHROID) 125 MCG tablet Take 125 mcg by mouth daily before breakfast.   Yes Historical Provider, MD  lithium carbonate (LITHOBID) 300 MG CR tablet Take 300 mg by mouth daily.   Yes Historical Provider, MD  metoprolol succinate (TOPROL-XL) 25 MG 24 hr tablet TAKE ONE TABLET BY MOUTH TWICE DAILY   Yes Thompson Grayer, MD  OLANZapine (ZYPREXA) 2.5 MG tablet Take 2.5 mg by mouth daily.  11/21/11  Yes Dayna N Dunn, PA-C  potassium chloride SA (KLOR-CON M20) 20 MEQ tablet Take 1 tablet (20 mEq total) by mouth daily.   Yes Thompson Grayer, MD  simvastatin (ZOCOR) 20 MG tablet Take 20 mg by mouth daily.   Yes Historical Provider, MD   BP 122/60  Pulse 117  Temp(Src) 98.5 F (36.9 C) (Oral)  Resp 14  SpO2 94% Physical Exam  Constitutional: She is oriented to person, place, and  time. She appears well-developed.  HENT:  Head: Normocephalic.  Eyes: Conjunctivae and EOM are normal. No scleral icterus.  Neck: Neck supple. No thyromegaly present.  Cardiovascular: Normal rate and regular rhythm.  Exam reveals no gallop and no friction rub.   No murmur heard. Pulmonary/Chest: No stridor. She has no wheezes. She has no rales. She exhibits no tenderness.  Abdominal: She exhibits no distension. There is no tenderness. There is no rebound.  Musculoskeletal: Normal range of motion. She exhibits no edema.  Lymphadenopathy:    She has no cervical adenopathy.  Neurological: She is oriented to person, place, and time. She exhibits normal muscle tone. Coordination normal.  Skin: No rash noted. No erythema.  Psychiatric: She has a normal mood and affect. Her behavior is normal.    ED Course  Procedures (including critical care time) Labs Review Labs Reviewed  BASIC METABOLIC PANEL - Abnormal; Notable for the following:    Creatinine, Ser 1.60 (*)    GFR calc non Af Amer 29 (*)    GFR calc Af Amer 34 (*)    All other components within normal limits  HEPATIC FUNCTION PANEL - Abnormal; Notable for the following:    Albumin 3.4 (*)    Total Bilirubin 0.2 (*)    All other components within normal limits  CBC WITH DIFFERENTIAL  Randolm Idol, ED    Imaging Review Dg Chest 2 View  04/02/2014   CLINICAL DATA:  Left-sided chest pain  EXAM: CHEST  2 VIEW  COMPARISON:  11/03/2013  FINDINGS: There are bilateral chronic bronchitic changes. There is no focal parenchymal opacity, pleural effusion, or pneumothorax. The heart and mediastinal contours are unremarkable.  The osseous structures are unremarkable.  IMPRESSION: No active cardiopulmonary disease.   Electronically Signed   By: Kathreen Devoid   On: 04/02/2014 11:57     EKG Interpretation   Date/Time:  Saturday April 02 2014 11:15:22 EDT Ventricular Rate:  88 PR Interval:    QRS Duration: 85 QT Interval:  393 QTC  Calculation: 475 R Axis:   76 Text Interpretation:  Atrial fibrillation Confirmed by Alvin Rubano  MD, Thena Devora  ([id number]) on 04/02/2014 2:08:04 PM      MDM   Final diagnoses:  Chest pain, unspecified chest pain type    Admit for chest pain    Maudry Diego, MD 04/02/14 1409

## 2014-04-02 NOTE — ED Notes (Signed)
Cardiology at bedside.

## 2014-04-02 NOTE — Telephone Encounter (Signed)
Not my patient.Has been seen by Dr.Allred/Hochrein

## 2014-04-02 NOTE — H&P (Signed)
History and Physical   Patient ID: Sonya Kaufman MRN: [id number], DOB/AGE: [birth year] 80 y.o. Date of Encounter: 04/02/2014  Primary Physician: Precious Reel, MD Primary Cardiologist: Dr. Rayann Heman  Chief Complaint:  Chest pain  HPI: Sonya Kaufman is a 80 y.o. female with no history of CAD. She is followed by Dr. Rayann Heman for atrial fibrillation, anticoagulated with Pradaxa. She also had HFpEF, felt to be euvolemic at a weight of 207 lbs when seen by Dr. Rayann Heman on 06/10. She was in atrial fibrillation at that time, rate controlled.  Pt has had several episodes of chest pain over the last 3 days. The episodes are of unclear duration. She will go to bed when she gets these and the symptoms will eventually resolve. The pain is a "squeezing", but she is unable to rate it. Chronic DOE but no SOB with the chest pain. No associate N&V or diaphoresis. She will feel very tired after the episodes. She has had > 5 episodes since they began. They have all started with exertion, but the exertion is walking around a store. She does not feel palpitations. Her daughter who is with her provides most of the history.  Currently in the ER, her ECG is not acute and her initial enzymes are negative. She is comfortable, says that is because she is resting.   Past Medical History  Diagnosis Date  . Diastolic CHF, chronic   . Other and unspecified hyperlipidemia     EF 60-65% by echo 11/2011  . Unspecified hypothyroidism   . Overweight(278.02)   . Diverticulosis of colon (without mention of hemorrhage)   . Benign neoplasm of colon   . Osteoarthrosis, unspecified whether generalized or localized, unspecified site   . Disorder of bone and cartilage, unspecified   . Depressive disorder, not elsewhere classified   . Venous insufficiency   . Renal insufficiency   . Paroxysmal atrial fibrillation   . Atrial flutter   . Tachycardia-bradycardia     Surgical History:  Past Surgical History  Procedure Laterality  Date  . Tonsillectomy      I have reviewed the patient's current medications. Medication Sig  buPROPion (WELLBUTRIN XL) 300 MG 24 hr tablet Take 300 mg by mouth daily.    dabigatran (PRADAXA) 75 MG CAPS capsule Take 75 mg by mouth 2 (two) times daily.  escitalopram (LEXAPRO) 20 MG tablet Take 20 mg by mouth daily.   furosemide (LASIX) 20 MG tablet Take 40 mg in the morning and take 20 mg in the evening. May take an additional tablet if needed.  levothyroxine (SYNTHROID, LEVOTHROID) 125 MCG tablet Take 125 mcg by mouth daily before breakfast.  lithium carbonate (LITHOBID) 300 MG CR tablet Take 300 mg by mouth daily.  metoprolol succinate (TOPROL-XL) 25 MG 24 hr tablet TAKE ONE TABLET BY MOUTH TWICE DAILY  OLANZapine (ZYPREXA) 2.5 MG tablet Take 2.5 mg by mouth daily.   potassium chloride SA (KLOR-CON M20) 20 MEQ tablet Take 1 tablet (20 mEq total) by mouth daily.  simvastatin (ZOCOR) 20 MG tablet Take 20 mg by mouth daily.    Allergies: No Known Allergies  History   Social History  . Marital Status: Married    Spouse Name: Clance Boll    Number of Children: 4  . Years of Education: N/A   Occupational History  . Retired    Social History Main Topics  . Smoking status: Never Smoker   . Smokeless tobacco: Never Used  . Alcohol Use: No  .  Drug Use: No  . Sexual Activity: Not on file   Other Topics Concern  . Not on file   Social History Narrative   Lives with family.    Family History  Problem Relation Age of Onset  . Heart failure Mother   . Arthritis Mother   . Heart disease Father   . Heart disease Other    Family Status  Relation Status Death Age  . Mother Deceased   . Father Deceased     Review of Systems:   Full 14-point review of systems otherwise negative except as noted above.  Physical Exam: Blood pressure 122/70, pulse 44, temperature 98.5 F (36.9 C), temperature source Oral, resp. rate 25, weight 205 lb 8 oz (93.214 kg), SpO2 95.00%. General: Well  developed, well nourished elderly woman, in no acute distress. Head: Normocephalic, atraumatic, sclera non-icteric, no xanthomas, nares are without discharge. Dentition: poor Neck: No carotid bruits. JVD elevated at 10 cm. No thyromegally Lungs: Good expansion bilaterally. without wheezes or rhonchi. Upper airway wheeze, some Heart: Irregular rate and rhythm with S1 S2.  No S3 or S4.  No murmur, no rubs, or gallops appreciated. Abdomen: Soft, non-tender, non-distended with normoactive bowel sounds. No hepatomegaly. No rebound/guarding. No obvious abdominal masses. Msk:  Strength and tone appear normal for age. No joint deformities or effusions, no spine or costo-vertebral angle tenderness. Extremities: No clubbing or cyanosis. No edema.  Distal pedal pulses are 2+ in 4 extrem Neuro: Alert and oriented X 3. Moves all extremities spontaneously. No focal deficits noted. Psych:  Responds to questions appropriately with a normal affect. Skin: No rashes or lesions noted  Labs:   Lab Results  Component Value Date   WBC 5.5 04/02/2014   HGB 14.3 04/02/2014   HCT 45.5 04/02/2014   MCV 95.6 04/02/2014   PLT 190 04/02/2014     Recent Labs Lab 04/02/14 1215  NA 144  K 3.9  CL 104  CO2 28  BUN 18  CREATININE 1.60*  CALCIUM 9.2  PROT 6.4  BILITOT 0.2*  ALKPHOS 83  ALT 14  AST 16  GLUCOSE 92    Recent Labs  04/02/14 1221  TROPIPOC 0.00   Pro B Natriuretic peptide (BNP)  Date/Time Value Ref Range Status  11/03/2013  5:34 AM 1745.0* 0 - 450 pg/mL Final  11/29/2011 12:07 PM 520.0* 0.0 - 100.0 pg/mL Final    Radiology/Studies: Dg Chest 2 View 04/02/2014   CLINICAL DATA:  Left-sided chest pain  EXAM: CHEST  2 VIEW  COMPARISON:  11/03/2013  FINDINGS: There are bilateral chronic bronchitic changes. There is no focal parenchymal opacity, pleural effusion, or pneumothorax. The heart and mediastinal contours are unremarkable.  The osseous structures are unremarkable.  IMPRESSION: No active  cardiopulmonary disease.   Electronically Signed   By: Kathreen Devoid   On: 04/02/2014 11:57   Echo: 11/18/2011 Study Conclusions - Left ventricle: Technically very difficult study- interpretation limited The cavity size was normal. Wall thickness was normal. Systolic function was normal. The estimated ejection fraction was in the range of 60% to 65%. Regional wall motion abnormalities cannot be excluded. - Pericardium, extracardiac: There was no pericardial effusion.  ECG: atrial fib, rate controlled, no acute ischemic changes  ASSESSMENT AND PLAN:  Principal Problem:   Chest pain with moderate risk for cardiac etiology - admit, cycle enzymes, add heparin if recurrent pain or elevated enzymes. MV in am if ez remain negative.  Active Problems:   HYPOTHYROIDISM - ck TSH, continue  synthroid    HYPERLIPIDEMIA - ck profile    Chronic diastolic CHF (congestive heart failure), NYHA class 2 - BNP is elevated and neck veins are up. Will give 1 dose IV Lasix, f/u results    Permanent atrial fibrillation - follow, ck HR with ambulation. Continue Pradaxa   Jonetta Speak, PA-C 04/02/2014 3:07 PM Beeper [phone]  Cardiology Attending  Patient seen and examined. Agree with above history, physical exam, assessment and plan as noted above. I have modified note as needed. The patient has chest pain with multiple cardiac risk factors and negative enzymes and no acute STT changes. As her symptoms have occurred with exertion, I wonder if she is having uncontrolled ventricular rates in atrial fib and demand ischemia, either with or without underlying CAD. Will obtain stress testing to rule out obstructive CAD. Hopefully she can be discharged with adjustments in her medications.  No plan for DCCV at this time. This question will be deferred to Dr. Rayann Heman.  Cristopher Peru, M.D.

## 2014-04-03 ENCOUNTER — Observation Stay (HOSPITAL_COMMUNITY): Payer: Medicare Other

## 2014-04-03 DIAGNOSIS — R079 Chest pain, unspecified: Secondary | ICD-10-CM

## 2014-04-03 LAB — TROPONIN I: Troponin I: <0.3 ng/mL (ref <0.30)

## 2014-04-03 MED ORDER — REGADENOSON 0.4 MG/5ML IV SOLN
INTRAVENOUS | Status: DC
Start: 2014-04-03 — End: 2014-04-03
  Filled 2014-04-03: qty 5

## 2014-04-03 MED ORDER — TECHNETIUM TC 99M SESTAMIBI - CARDIOLITE
10.0000 | Freq: Once | INTRAVENOUS | Status: AC | PRN
  Administered 2014-04-03: 09:00:00 10 via INTRAVENOUS

## 2014-04-03 MED ORDER — REGADENOSON 0.4 MG/5ML IV SOLN
0.4000 mg | Freq: Once | INTRAVENOUS | Status: AC
  Administered 2014-04-03: 0.4 mg via INTRAVENOUS
  Filled 2014-04-03: qty 5

## 2014-04-03 MED ORDER — TECHNETIUM TC 99M SESTAMIBI - CARDIOLITE
30.0000 | Freq: Once | INTRAVENOUS | Status: AC | PRN
  Administered 2014-04-03: 11:00:00 30 via INTRAVENOUS

## 2014-04-03 NOTE — Discharge Summary (Signed)
CARDIOLOGY DISCHARGE SUMMARY   Patient ID: Sonya Kaufman MRN: [id number] DOB/AGE: Aug 19, 1931 80 y.o.  Admit date: 04/02/2014 Discharge date: 04/03/2014  PCP: Precious Reel, MD Primary Cardiologist: Dr. Rayann Heman   Primary Discharge Diagnosis: Chest pain with moderate risk for cardiac etiology  Secondary Discharge Diagnosis:    HYPOTHYROIDISM   HYPERLIPIDEMIA   Chronic diastolic CHF (congestive heart failure), NYHA class 2   Permanent atrial fibrillation   Precordial pain  Procedures: [facility] Course: Sonya Kaufman is a 80 y.o. female with a history of atrial fibrillation, chronically anticoagulated with Pradaxa as well as diastolic CHF. She had several episodes of chest pain that started with exertion, improved with rest. She came to the hospital where she was admitted for further evaluation and treatment.  There was concern for some mild volume overload, and she was given one dose of IV Lasix with improvement in her weight and in her respiratory status.   Her cardiac enzymes were negative for MI. She had no significant arrhythmia on telemetry and none of her other screening labs were significantly abnormal.   On 07/05, she had a Lexi scan Myoview, results are below. It showed no scar or ischemia, EF 81%. Her symptoms have completely resolved.   On 07/05, she was evaluated by Dr. Harl Bowie and all data were reviewed. No further inpatient workup is indicated and she is considered stable for discharge, to followup as an outpatient.  Labs:  Lab Results  Component Value Date   WBC 5.5 04/02/2014   HGB 14.3 04/02/2014   HCT 45.5 04/02/2014   MCV 95.6 04/02/2014   PLT 190 04/02/2014     Recent Labs Lab 04/02/14 1215  NA 144  K 3.9  CL 104  CO2 28  BUN 18  CREATININE 1.60*  CALCIUM 9.2  PROT 6.4  BILITOT 0.2*  ALKPHOS 83  ALT 14  AST 16  GLUCOSE 92    Recent Labs  04/02/14 1740 04/02/14 2207 04/03/14 0401  TROPONINI <0.30 <0.30 <0.30   Pro B  Natriuretic peptide (BNP)  Date/Time Value Ref Range Status  11/03/2013  5:34 AM 1745.0* 0 - 450 pg/mL Final  11/29/2011 12:07 PM 520.0* 0.0 - 100.0 pg/mL Final    Radiology: Dg Chest 2 View 04/02/2014   CLINICAL DATA:  Left-sided chest pain  EXAM: CHEST  2 VIEW  COMPARISON:  11/03/2013  FINDINGS: There are bilateral chronic bronchitic changes. There is no focal parenchymal opacity, pleural effusion, or pneumothorax. The heart and mediastinal contours are unremarkable.  The osseous structures are unremarkable.  IMPRESSION: No active cardiopulmonary disease.   Electronically Signed   By: Kathreen Devoid   On: 04/02/2014 11:57   Lexi scan Myoview: 04/03/2014 FINDINGS:  Immediate post regadenoson images demonstrate anteroapical thinning  which can be attributed to breast attenuation. Initial resting  images showed similar findings, with greater attenuation on the  resting images. No significant focal perfusion defects. No evidence  of reversibility to suggest ischemia. Findings confirmed by the  computer generated polar map.  Gated images demonstrate satisfactory thickening throughout the left  ventricular myocardium with normal wall motion throughout.  Estimated QGS left ventricular ejection fraction measured 81%, with  an end-diastolic volume of 44 ml and an end systolic volume of 8 ml.  IMPRESSION:  1. No evidence of myocardial ischemia or infarction.  2. Normal left ventricular wall motion.  3. QGS ejection fraction likely overestimated at 81%.  Electronically Signed  By: Evangeline Dakin M.D.  On:  04/03/2014 14:36   EKG: 04/02/2014 Atrial fibrillation, controlled ventricular response, in no acute ischemic changes. Vent. rate 88 BPM PR interval * ms QRS duration 85 ms QT/QTc 393/475 ms P-R-T axes -1 76 62  FOLLOW UP PLANS AND APPOINTMENTS No Known Allergies   Medication List         buPROPion 300 MG 24 hr tablet  Commonly known as:  WELLBUTRIN XL  Take 300 mg by mouth daily.      dabigatran 75 MG Caps capsule  Commonly known as:  PRADAXA  Take 75 mg by mouth 2 (two) times daily.     escitalopram 20 MG tablet  Commonly known as:  LEXAPRO  Take 20 mg by mouth daily.     furosemide 20 MG tablet  Commonly known as:  LASIX  Take 40 mg in the morning and take 20 mg in the evening. May take an additional tablet if needed.     levothyroxine 125 MCG tablet  Commonly known as:  SYNTHROID, LEVOTHROID  Take 125 mcg by mouth daily before breakfast.     lithium carbonate 300 MG CR tablet  Commonly known as:  LITHOBID  Take 300 mg by mouth daily.     metoprolol succinate 25 MG 24 hr tablet  Commonly known as:  TOPROL-XL  TAKE ONE TABLET BY MOUTH TWICE DAILY     OLANZapine 2.5 MG tablet  Commonly known as:  ZYPREXA  Take 2.5 mg by mouth daily.     potassium chloride SA 20 MEQ tablet  Commonly known as:  KLOR-CON M20  Take 1 tablet (20 mEq total) by mouth daily.     simvastatin 20 MG tablet  Commonly known as:  ZOCOR  Take 20 mg by mouth daily.        Discharge Instructions   (HEART FAILURE PATIENTS) Call MD:  Anytime you have any of the following symptoms: 1) 3 pound weight gain in 24 hours or 5 pounds in 1 week 2) shortness of breath, with or without a dry hacking cough 3) swelling in the hands, feet or stomach 4) if you have to sleep on extra pillows at night in order to breathe.    Complete by:  As directed      Diet - low sodium heart healthy    Complete by:  As directed      Increase activity slowly    Complete by:  As directed           Follow-up Information   Follow up with Thompson Grayer, MD. (The office will call)    Specialty:  Cardiology   Contact information:   [address] [place] [address] [phone]       BRING ALL MEDICATIONS WITH YOU TO FOLLOW UP APPOINTMENTS  Time spent with patient to include physician time: 46 min Signed: Rosaria Ferries, PA-C 04/03/2014, 3:16 PM Co-Sign MD   Attending Note I agree with  documentation as stated above  Zandra Abts MD

## 2014-04-03 NOTE — Progress Notes (Signed)
Currently in [facility].  Candee Furbish, MD

## 2014-04-03 NOTE — Progress Notes (Signed)
Patient Name: Sonya Kaufman Date of Encounter: 04/03/2014  Principal Problem:   Chest pain with moderate risk for cardiac etiology Active Problems:   HYPOTHYROIDISM   HYPERLIPIDEMIA   Chronic diastolic CHF (congestive heart failure), NYHA class 2   Permanent atrial fibrillation   Precordial pain    Patient Profile: 80 yo female w/ hx afib and HFpEF on Xarelto, was admitted 07/04 with chest pain  SUBJECTIVE: No more chest pain. Breathing at baseline.  OBJECTIVE Filed Vitals:   04/03/14 0000 04/03/14 0400 04/03/14 0823 04/03/14 1008  BP: 130/67 115/56 116/70 126/68  Pulse: 80 77 88 75  Temp: 98 F (36.7 C) 97.5 F (36.4 C) 97.7 F (36.5 C)   TempSrc:   Oral   Resp: 20 20 19    Height:      Weight:  199 lb 1.6 oz (90.311 kg)    SpO2: 98% 96% 95%     Intake/Output Summary (Last 24 hours) at 04/03/14 1030 Last data filed at 04/03/14 0500  Gross per 24 hour  Intake      0 ml  Output    450 ml  Net   -450 ml   Filed Weights   04/02/14 1457 04/02/14 1635 04/03/14 0400  Weight: 205 lb 8 oz (93.214 kg) 204 lb 11.2 oz (92.851 kg) 199 lb 1.6 oz (90.311 kg)    PHYSICAL EXAM General: Well developed, well nourished, female in no acute distress. Head: Normocephalic, atraumatic.  Neck: Supple without bruits, JVD 8 cm. Lungs:  Resp regular and unlabored, few rales bases. Heart: Irregular R&R, S1, S2, no S3, S4, or murmur; no rub. Abdomen: Soft, non-tender, non-distended, BS + x 4.  Extremities: No clubbing, cyanosis, no edema.  Neuro: Alert and oriented X 3. Moves all extremities spontaneously. Psych: Normal affect.  LABS: CBC: Recent Labs  04/02/14 1215  WBC 5.5  NEUTROABS 2.9  HGB 14.3  HCT 45.5  MCV 95.6  PLT 99991111   Basic Metabolic Panel: Recent Labs  04/02/14 1215  NA 144  K 3.9  CL 104  CO2 28  GLUCOSE 92  BUN 18  CREATININE 1.60*  CALCIUM 9.2   Liver Function Tests: Recent Labs  04/02/14 1215  AST 16  ALT 14  ALKPHOS 83  BILITOT 0.2*    PROT 6.4  ALBUMIN 3.4*   Cardiac Enzymes: Recent Labs  04/02/14 1740 04/02/14 2207 04/03/14 0401  TROPONINI <0.30 <0.30 <0.30    Recent Labs  04/02/14 1221  TROPIPOC 0.00    TELE: atrial fib, rate controlled (seen in nuc med)       Radiology/Studies: Dg Chest 2 View 04/02/2014   CLINICAL DATA:  Left-sided chest pain  EXAM: CHEST  2 VIEW  COMPARISON:  11/03/2013  FINDINGS: There are bilateral chronic bronchitic changes. There is no focal parenchymal opacity, pleural effusion, or pneumothorax. The heart and mediastinal contours are unremarkable.  The osseous structures are unremarkable.  IMPRESSION: No active cardiopulmonary disease.   Electronically Signed   By: Kathreen Devoid   On: 04/02/2014 11:57   Current Medications:  . buPROPion  300 mg Oral Daily  . dabigatran  75 mg Oral BID  . escitalopram  20 mg Oral Daily  . furosemide  20 mg Oral q1800  . furosemide  40 mg Oral QAC breakfast  . levothyroxine  125 mcg Oral QAC breakfast  . lithium carbonate  300 mg Oral Daily  . metoprolol succinate  25 mg Oral BID  . OLANZapine  2.5  mg Oral Daily  . potassium chloride SA  20 mEq Oral Daily  . regadenoson      . regadenoson  0.4 mg Intravenous Once  . simvastatin  20 mg Oral q1800   ASSESSMENT AND PLAN: Principal Problem:   Chest pain with moderate risk for cardiac etiology - ez negative for MI, for Lexi MV today to assess for ischemia  Active Problems:   HYPOTHYROIDISM - continue synthroid, f/u with primary MD    HYPERLIPIDEMIA - continue statin    Chronic diastolic CHF (congestive heart failure), NYHA class 2 - SOB yesterday, weight down after IV Lasix x 1, ck sats with ambulation and see how tolerated. CXR was OK    Permanent atrial fibrillation - rate is OK on current rx    Precordial pain  Plan - D/C today if MV negative.   Signed, Rosaria Ferries , PA-C 10:30 AM 04/03/2014  Attending Note Patient seen and discussed with PA Barrett, agree with her documentation  above. Lexiscan is negative for ischemia, her pain appears to be non-cardiac. She reports she has been ambulating without significant SOB this morning. Plan for discharge home today with follow up with primary cardiologist in next few weeks. Resume prior home medications.   Zandra Abts MD

## 2014-04-03 NOTE — Progress Notes (Signed)
Lexsican CL performed

## 2014-04-04 NOTE — Telephone Encounter (Signed)
PT  HAS APPT WITH  DR Rayann Heman ON   04-11-14 AT 4:15 PM./CY

## 2014-04-04 NOTE — Telephone Encounter (Signed)
Sonya Kaufman to call to schedule follow up with Dr Rayann Heman

## 2014-04-11 ENCOUNTER — Ambulatory Visit: Payer: Medicare Other | Admitting: Internal Medicine

## 2014-04-13 ENCOUNTER — Other Ambulatory Visit: Payer: Self-pay | Admitting: Pulmonary Disease

## 2014-04-13 ENCOUNTER — Other Ambulatory Visit: Payer: Self-pay | Admitting: Internal Medicine

## 2014-04-27 ENCOUNTER — Telehealth: Payer: Self-pay | Admitting: *Deleted

## 2014-04-27 NOTE — Telephone Encounter (Signed)
Pradaxa samples placed at the front desk for pick up.

## 2014-05-26 ENCOUNTER — Encounter: Payer: Self-pay | Admitting: Gastroenterology

## 2014-06-20 ENCOUNTER — Telehealth: Payer: Self-pay | Admitting: *Deleted

## 2014-06-20 NOTE — Telephone Encounter (Signed)
Pradaxa samples placed at the front desk for pick up.

## 2014-08-08 ENCOUNTER — Other Ambulatory Visit: Payer: Self-pay | Admitting: Internal Medicine

## 2014-08-10 ENCOUNTER — Telehealth: Payer: Self-pay

## 2014-08-10 NOTE — Telephone Encounter (Signed)
Patient's daughter called to get samples of pradaxa placed a month of pradaxa 75 mg up front

## 2014-09-20 ENCOUNTER — Other Ambulatory Visit: Payer: Self-pay | Admitting: Internal Medicine

## 2014-09-21 ENCOUNTER — Encounter: Payer: Self-pay | Admitting: Internal Medicine

## 2014-09-21 ENCOUNTER — Ambulatory Visit (INDEPENDENT_AMBULATORY_CARE_PROVIDER_SITE_OTHER): Payer: Medicare Other | Admitting: Internal Medicine

## 2014-09-21 VITALS — BP 126/74 | HR 77 | Ht 67.0 in | Wt 207.0 lb

## 2014-09-21 DIAGNOSIS — I482 Chronic atrial fibrillation, unspecified: Secondary | ICD-10-CM

## 2014-09-21 DIAGNOSIS — I5032 Chronic diastolic (congestive) heart failure: Secondary | ICD-10-CM

## 2014-09-21 NOTE — Patient Instructions (Signed)
Your physician wants you to follow-up in: 6 months with Dr. Allred. You will receive a reminder letter in the mail two months in advance. If you don't receive a letter, please call our office to schedule the follow-up appointment.  

## 2014-09-21 NOTE — Progress Notes (Deleted)
PCP: Precious Reel, MD  Seandra Crislip is a 80 y.o. female who presents today for routine cardiology follow up for longstanding persistent afib. She is not aware of any irregular heart beat, rate controlled.. Continues on blood thinner without issues. No significant change in health since last visit.      Today, she denies symptoms of palpitations, chest pain, shortness of breath,  lower extremity edema, dizziness, presyncope, or syncope.  The patient is otherwise without complaint today.   Past Medical History  Diagnosis Date  . Diastolic CHF, chronic   . Other and unspecified hyperlipidemia     EF 60-65% by echo 11/2011  . Unspecified hypothyroidism   . Overweight(278.02)   . Diverticulosis of colon (without mention of hemorrhage)   . Benign neoplasm of colon   . Osteoarthrosis, unspecified whether generalized or localized, unspecified site   . Disorder of bone and cartilage, unspecified   . Depressive disorder, not elsewhere classified   . Venous insufficiency   . Renal insufficiency   . Paroxysmal atrial fibrillation   . Atrial flutter   . Tachycardia-bradycardia    Past Surgical History  Procedure Laterality Date  . Tonsillectomy      Current Outpatient Prescriptions  Medication Sig Dispense Refill  . buPROPion (WELLBUTRIN XL) 300 MG 24 hr tablet Take 300 mg by mouth daily.      . dabigatran (PRADAXA) 75 MG CAPS capsule Take 75 mg by mouth 2 (two) times daily.    Marland Kitchen escitalopram (LEXAPRO) 20 MG tablet Take 20 mg by mouth daily.     . furosemide (LASIX) 20 MG tablet TAKE ONE TABLET BY MOUTH THREE TIMES DAILY AS NEEDED FOR  WEIGHT  GAIN  OF  2-3  POUNDS 105 tablet 0  . KLOR-CON M20 20 MEQ tablet TAKE ONE TABLET BY MOUTH ONCE DAILY 30 tablet 5  . levothyroxine (SYNTHROID, LEVOTHROID) 125 MCG tablet Take 125 mcg by mouth daily before breakfast.    . lithium carbonate (LITHOBID) 300 MG CR tablet Take 300 mg by mouth daily.    . metoprolol succinate (TOPROL-XL) 25 MG 24 hr tablet  TAKE ONE TABLET BY MOUTH TWICE DAILY 60 tablet 0  . OLANZapine (ZYPREXA) 2.5 MG tablet Take 2.5 mg by mouth daily. 1/2 TAB PO QD    . simvastatin (ZOCOR) 20 MG tablet Take 20 mg by mouth daily.     No current facility-administered medications for this visit.   ROS- all systems are reviewed and negative except as per HPI above  Physical Exam: Filed Vitals:   09/21/14 1158  BP: 126/74  Pulse: 77  Height: 5\' 7"  (1.702 m)  Weight: 207 lb (93.895 kg)    GEN- The patient is well appearing, alert and oriented x 3 today.   Head- normocephalic, atraumatic Eyes-  Sclera clear, conjunctiva pink Ears- hearing intact Oropharynx- clear Lungs- Clear to ausculation bilaterally, normal work of breathing Heart- irregular rate and rhythm, no murmurs, rubs or gallops, PMI not laterally displaced GI- soft, NT, ND, + BS Extremities- no clubbing, cyanosis, or edema  ekg today reveals afib, v rate 77 bpm.  Assessment and Plan:  Atrial fibrillation  She is in afib today but asymptomatic Continue rate control Anticoagulated with pradaxa at 75 mg bid Bmet from 7/4 reviewed and RI stable at 1.6   Chronic diastolic heart failure   euvolemic today  Continue daily weights  2g sodium restriction  Bradycardia  resolved  Return in 6 months

## 2014-09-22 NOTE — Progress Notes (Signed)
PCP: Precious Reel, MD  Sonya Kaufman is a 80 y.o. female who presents today for routine cardiology follow up for longstanding persistent afib.   She is doing well at this time, without any cardiac concerns.   Today, she denies symptoms of palpitations, chest pain, shortness of breath,  lower extremity edema, dizziness, presyncope, or syncope.  The patient is otherwise without complaint today.   Past Medical History  Diagnosis Date  . Diastolic CHF, chronic   . Other and unspecified hyperlipidemia     EF 60-65% by echo 11/2011  . Unspecified hypothyroidism   . Overweight(278.02)   . Diverticulosis of colon (without mention of hemorrhage)   . Benign neoplasm of colon   . Osteoarthrosis, unspecified whether generalized or localized, unspecified site   . Disorder of bone and cartilage, unspecified   . Depressive disorder, not elsewhere classified   . Venous insufficiency   . Renal insufficiency   . Paroxysmal atrial fibrillation   . Atrial flutter   . Tachycardia-bradycardia    Past Surgical History  Procedure Laterality Date  . Tonsillectomy      Current Outpatient Prescriptions  Medication Sig Dispense Refill  . buPROPion (WELLBUTRIN XL) 300 MG 24 hr tablet Take 300 mg by mouth daily.      . dabigatran (PRADAXA) 75 MG CAPS capsule Take 75 mg by mouth 2 (two) times daily.    Marland Kitchen escitalopram (LEXAPRO) 20 MG tablet Take 20 mg by mouth daily.     . furosemide (LASIX) 20 MG tablet TAKE ONE TABLET BY MOUTH THREE TIMES DAILY AS NEEDED FOR  WEIGHT  GAIN  OF  2-3  POUNDS 105 tablet 0  . KLOR-CON M20 20 MEQ tablet TAKE ONE TABLET BY MOUTH ONCE DAILY 30 tablet 5  . levothyroxine (SYNTHROID, LEVOTHROID) 125 MCG tablet Take 125 mcg by mouth daily before breakfast.    . lithium carbonate (LITHOBID) 300 MG CR tablet Take 300 mg by mouth daily.    . metoprolol succinate (TOPROL-XL) 25 MG 24 hr tablet TAKE ONE TABLET BY MOUTH TWICE DAILY 60 tablet 0  . OLANZapine (ZYPREXA) 2.5 MG tablet Take 2.5 mg  by mouth daily. 1/2 TAB PO QD    . simvastatin (ZOCOR) 20 MG tablet Take 20 mg by mouth daily.     No current facility-administered medications for this visit.   ROS- all systems are reviewed and negative except as per HPI above  Physical Exam: Filed Vitals:   09/21/14 1158  BP: 126/74  Pulse: 77  Height: 5\' 7"  (1.702 m)  Weight: 207 lb (93.895 kg)    GEN- The patient is well appearing, alert and oriented x 3 today.   Head- normocephalic, atraumatic Eyes-  Sclera clear, conjunctiva pink Ears- hearing intact Oropharynx- clear Lungs- Clear to ausculation bilaterally, normal work of breathing Heart- irregular rate and rhythm, no murmurs, rubs or gallops, PMI not laterally displaced GI- soft, NT, ND, + BS Extremities- no clubbing, cyanosis, or edema  ekg today reveals afib, v rate 77 bpm.  Assessment and Plan:  Atrial fibrillation  Asymptomatic longstanding persistent afib Continue rate control Anticoagulated with pradaxa at 75 mg bid Bmet from 7/4 reviewed and RI stable at 1.6   Chronic diastolic heart failure   euvolemic today  Continue daily weights  2g sodium restriction  Bradycardia  resolved  Return in 6 months

## 2014-10-10 ENCOUNTER — Other Ambulatory Visit: Payer: Self-pay | Admitting: Internal Medicine

## 2014-10-19 ENCOUNTER — Other Ambulatory Visit: Payer: Self-pay | Admitting: Internal Medicine

## 2014-10-27 ENCOUNTER — Other Ambulatory Visit: Payer: Self-pay | Admitting: Internal Medicine

## 2014-11-01 ENCOUNTER — Other Ambulatory Visit: Payer: Self-pay | Admitting: Internal Medicine

## 2014-12-01 ENCOUNTER — Other Ambulatory Visit: Payer: Self-pay | Admitting: Internal Medicine

## 2015-02-15 ENCOUNTER — Other Ambulatory Visit: Payer: Self-pay | Admitting: Internal Medicine

## 2015-03-22 ENCOUNTER — Other Ambulatory Visit: Payer: Self-pay | Admitting: Internal Medicine

## 2015-03-27 ENCOUNTER — Encounter: Payer: Self-pay | Admitting: Internal Medicine

## 2015-03-27 ENCOUNTER — Ambulatory Visit (INDEPENDENT_AMBULATORY_CARE_PROVIDER_SITE_OTHER): Payer: Medicare Other | Admitting: Internal Medicine

## 2015-03-27 VITALS — BP 122/70 | HR 73 | Ht 67.0 in | Wt 206.6 lb

## 2015-03-27 DIAGNOSIS — I5033 Acute on chronic diastolic (congestive) heart failure: Secondary | ICD-10-CM | POA: Insufficient documentation

## 2015-03-27 DIAGNOSIS — R001 Bradycardia, unspecified: Secondary | ICD-10-CM | POA: Diagnosis not present

## 2015-03-27 DIAGNOSIS — I482 Chronic atrial fibrillation, unspecified: Secondary | ICD-10-CM

## 2015-03-27 DIAGNOSIS — N289 Disorder of kidney and ureter, unspecified: Secondary | ICD-10-CM

## 2015-03-27 LAB — CBC WITH DIFFERENTIAL/PLATELET
BASOS ABS: 0 10*3/uL (ref 0.0–0.1)
Basophils Relative: 0.4 % (ref 0.0–3.0)
EOS PCT: 1.6 % (ref 0.0–5.0)
Eosinophils Absolute: 0.1 10*3/uL (ref 0.0–0.7)
HEMATOCRIT: 43.6 % (ref 36.0–46.0)
HEMOGLOBIN: 14.1 g/dL (ref 12.0–15.0)
LYMPHS ABS: 2.3 10*3/uL (ref 0.7–4.0)
Lymphocytes Relative: 34.7 % (ref 12.0–46.0)
MCHC: 32.4 g/dL (ref 30.0–36.0)
MCV: 91.6 fl (ref 78.0–100.0)
MONO ABS: 0.6 10*3/uL (ref 0.1–1.0)
MONOS PCT: 9.4 % (ref 3.0–12.0)
NEUTROS ABS: 3.6 10*3/uL (ref 1.4–7.7)
Neutrophils Relative %: 53.9 % (ref 43.0–77.0)
Platelets: 218 10*3/uL (ref 150.0–400.0)
RBC: 4.76 Mil/uL (ref 3.87–5.11)
RDW: 14.9 % (ref 11.5–15.5)
WBC: 6.7 10*3/uL (ref 4.0–10.5)

## 2015-03-27 LAB — BASIC METABOLIC PANEL
BUN: 19 mg/dL (ref 6–23)
CALCIUM: 9.5 mg/dL (ref 8.4–10.5)
CO2: 34 meq/L — AB (ref 19–32)
Chloride: 102 mEq/L (ref 96–112)
Creatinine, Ser: 1.68 mg/dL — ABNORMAL HIGH (ref 0.40–1.20)
GFR: 30.99 mL/min — ABNORMAL LOW (ref >60.00)
Glucose, Bld: 88 mg/dL (ref 70–99)
Potassium: 3.8 mEq/L (ref 3.5–5.1)
SODIUM: 140 meq/L (ref 135–145)

## 2015-03-27 NOTE — Progress Notes (Signed)
PCP: Precious Reel, MD  Sonya Kaufman is a 81 y.o. female who presents today for routine cardiology follow up for longstanding persistent afib.   She is doing well at this time.  She has noticed mild increase in exertional SOB over the past 4 weeks or so.  She also has mild abdominal distension.   Today, she denies symptoms of palpitations, chest pain,  lower extremity edema, dizziness, presyncope, or syncope.  The patient is otherwise without complaint today.   Past Medical History  Diagnosis Date  . Diastolic CHF, chronic   . Other and unspecified hyperlipidemia     EF 60-65% by echo 11/2011  . Unspecified hypothyroidism   . Overweight(278.02)   . Diverticulosis of colon (without mention of hemorrhage)   . Benign neoplasm of colon   . Osteoarthrosis, unspecified whether generalized or localized, unspecified site   . Disorder of bone and cartilage, unspecified   . Depressive disorder, not elsewhere classified   . Venous insufficiency   . Renal insufficiency   . Paroxysmal atrial fibrillation   . Atrial flutter   . Tachycardia-bradycardia    Past Surgical History  Procedure Laterality Date  . Tonsillectomy      Current Outpatient Prescriptions  Medication Sig Dispense Refill  . buPROPion (WELLBUTRIN XL) 300 MG 24 hr tablet Take 300 mg by mouth daily.      . dabigatran (PRADAXA) 75 MG CAPS capsule Take 75 mg by mouth 2 (two) times daily.    Marland Kitchen escitalopram (LEXAPRO) 20 MG tablet Take 20 mg by mouth daily.     . furosemide (LASIX) 20 MG tablet TAKE ONE TABLET BY MOUTH THREE TIMES DAILY AS NEEDED FOR  WEIGHT  GAIN  OF  2-3  POUNDS 105 tablet 0  . KLOR-CON M20 20 MEQ tablet TAKE ONE TABLET BY MOUTH ONCE DAILY 30 tablet 5  . levothyroxine (SYNTHROID, LEVOTHROID) 125 MCG tablet Take 125 mcg by mouth daily before breakfast.    . lithium carbonate (LITHOBID) 300 MG CR tablet Take 300 mg by mouth daily.    . metoprolol succinate (TOPROL-XL) 25 MG 24 hr tablet TAKE ONE TABLET BY MOUTH TWICE  DAILY 60 tablet 0  . OLANZapine (ZYPREXA) 2.5 MG tablet Take 2.5 mg by mouth daily. 1/2 TAB PO QD    . simvastatin (ZOCOR) 20 MG tablet Take 20 mg by mouth daily.     No current facility-administered medications for this visit.   ROS- all systems are reviewed and negative except as per HPI above  Physical Exam: Filed Vitals:   09/21/14 1158  BP: 126/74  Pulse: 77  Height: 5\' 7"  (1.702 m)  Weight: 207 lb (93.895 kg)    GEN- The patient is well appearing, alert and oriented x 3 today.   Head- normocephalic, atraumatic Eyes-  Sclera clear, conjunctiva pink Ears- hearing intact Oropharynx- clear Lungs- Clear to ausculation bilaterally, normal work of breathing Heart- irregular rate and rhythm, no murmurs, rubs or gallops, PMI not laterally displaced GI- soft, NT, ND, + BS Extremities- no clubbing, cyanosis, or edema  ekg today reveals afib, v rate 80 bpm  Assessment and Plan:  Atrial fibrillation  Asymptomatic longstanding persistent afib Continue rate control Anticoagulated with pradaxa at 75 mg bid Check bmet and cbc today   Acute on chronic diastolic heart failure   Perhaps mildly volume overloaded though weight is near baseline 2 gram sodium restriction advised Given renal failure, will check bmet today Will increase lasix to 40mg  BID x 3 days  then resume prior dosing of 40mg  qam and 20mg  qpm  Bradycardia  resolved  Return in 6 months

## 2015-03-27 NOTE — Patient Instructions (Signed)
Medication Instructions:  Your physician has recommended you make the following change in your medication:  1) Increase your Furosemide for 3 days to 40mg  twice daily, then resume previous dose of 40 mg in the am and 20 mg at lunch   Labwork: Your physician recommends that you return for lab work today: CBC/BMP   Testing/Procedures: None ordered  Follow-Up: Your physician wants you to follow-up in: 6 months with Dr Rayann Heman Dennis Bast will receive a reminder letter in the mail two months in advance. If you don't receive a letter, please call our office to schedule the follow-up appointment.   Any Other Special Instructions Will Be Listed Below (If Applicable).

## 2015-04-05 ENCOUNTER — Other Ambulatory Visit: Payer: Self-pay | Admitting: Internal Medicine

## 2015-04-06 ENCOUNTER — Other Ambulatory Visit: Payer: Self-pay

## 2015-04-06 MED ORDER — POTASSIUM CHLORIDE CRYS ER 20 MEQ PO TBCR: 20.0000 meq | EXTENDED_RELEASE_TABLET | Freq: Every day | ORAL | Status: DC

## 2015-04-19 ENCOUNTER — Other Ambulatory Visit: Payer: Self-pay | Admitting: Internal Medicine

## 2015-06-08 ENCOUNTER — Other Ambulatory Visit: Payer: Self-pay | Admitting: Internal Medicine

## 2015-06-13 ENCOUNTER — Telehealth: Payer: Self-pay | Admitting: *Deleted

## 2015-06-13 NOTE — Telephone Encounter (Signed)
Pradaxa samples placed at the front desk for patient.

## 2015-06-28 ENCOUNTER — Other Ambulatory Visit: Payer: Self-pay | Admitting: Internal Medicine

## 2015-08-02 ENCOUNTER — Telehealth: Payer: Self-pay

## 2015-08-02 NOTE — Telephone Encounter (Signed)
Provided 4 sample boxes of Pradaxa 75 mg for patient.

## 2015-08-04 ENCOUNTER — Other Ambulatory Visit: Payer: Self-pay | Admitting: Internal Medicine

## 2015-08-23 ENCOUNTER — Telehealth: Payer: Self-pay | Admitting: Internal Medicine

## 2015-08-23 NOTE — Telephone Encounter (Signed)
Called pt to inform her that I was leaving a month supply of the Pradaxa 75 mg tablet up at the front desk and if she has any other problems, questions or concerns to call the office. Pt verbalized understanding.

## 2015-08-23 NOTE — Telephone Encounter (Signed)
Patient calling the office for samples of medication:   1.  What medication and dosage are you requesting samples for? Prodaxa  2.  Are you currently out of this medication? Pt's daughter stating "Pt is almost out and is in the donut hole"

## 2015-09-18 ENCOUNTER — Encounter: Payer: Self-pay | Admitting: Internal Medicine

## 2015-09-18 ENCOUNTER — Ambulatory Visit (INDEPENDENT_AMBULATORY_CARE_PROVIDER_SITE_OTHER): Payer: Medicare Other | Admitting: Internal Medicine

## 2015-09-18 VITALS — BP 108/68 | HR 75 | Ht 67.0 in | Wt 205.8 lb

## 2015-09-18 DIAGNOSIS — I482 Chronic atrial fibrillation, unspecified: Secondary | ICD-10-CM

## 2015-09-18 DIAGNOSIS — I5032 Chronic diastolic (congestive) heart failure: Secondary | ICD-10-CM | POA: Diagnosis not present

## 2015-09-18 NOTE — Patient Instructions (Signed)

## 2015-09-18 NOTE — Progress Notes (Signed)
PCP: Precious Reel, MD  Sonya Kaufman is a 81 y.o. female who presents today for routine cardiology follow up for longstanding persistent afib.   She is doing well at this time.   Today, she denies symptoms of palpitations, SOB, chest pain,  lower extremity edema, dizziness, presyncope, or syncope.  The patient is otherwise without complaint today.   Past Medical History  Diagnosis Date  . Diastolic CHF, chronic   . Other and unspecified hyperlipidemia     EF 60-65% by echo 11/2011  . Unspecified hypothyroidism   . Overweight(278.02)   . Diverticulosis of colon (without mention of hemorrhage)   . Benign neoplasm of colon   . Osteoarthrosis, unspecified whether generalized or localized, unspecified site   . Disorder of bone and cartilage, unspecified   . Depressive disorder, not elsewhere classified   . Venous insufficiency   . Renal insufficiency   . Paroxysmal atrial fibrillation   . Atrial flutter   . Tachycardia-bradycardia    Past Surgical History  Procedure Laterality Date  . Tonsillectomy      Current outpatient prescriptions:  .  buPROPion (WELLBUTRIN XL) 300 MG 24 hr tablet, Take 300 mg by mouth daily.  , Disp: , Rfl:  .  escitalopram (LEXAPRO) 20 MG tablet, Take 20 mg by mouth daily. , Disp: , Rfl:  .  furosemide (LASIX) 20 MG tablet, Take 2 tablets by mouth in the AM and 1 tablet in the evening. May take 1 extra tablet daily as needed for weight gain, Disp: , Rfl:  .  lithium carbonate (LITHOBID) 300 MG CR tablet, Take 300 mg by mouth daily., Disp: , Rfl:  .  metoprolol succinate (TOPROL-XL) 25 MG 24 hr tablet, TAKE 1 TABLET BY MOUTH 2 TIMES DAILY, Disp: 60 tablet, Rfl: 11 .  OLANZapine (ZYPREXA) 2.5 MG tablet, Take 2.5 mg by mouth daily. 1/2 TAB PO QD, Disp: , Rfl:  .  potassium chloride SA (K-DUR,KLOR-CON) 20 MEQ tablet, Take 1 tablet (20 mEq total) by mouth daily., Disp: 30 tablet, Rfl: 11 .  PRADAXA 75 MG CAPS capsule, TAKE ONE CAPSULE BY MOUTH EVERY 12 HOURS, Disp: 60  capsule, Rfl: 3 .  simvastatin (ZOCOR) 20 MG tablet, Take 20 mg by mouth daily., Disp: , Rfl:  .  SYNTHROID 112 MCG tablet, Take 1 tablet by mouth daily before breakfast., Disp: , Rfl:    ROS- all systems are reviewed and negative except as per HPI above  Physical Exam: Filed Vitals:   09/18/15 1225  BP: 108/68  Pulse: 75   GEN- The patient is well appearing, alert and oriented x 3 today.   Head- normocephalic, atraumatic Eyes-  Sclera clear, conjunctiva pink Ears- hearing intact Oropharynx- clear Lungs- Clear to ausculation bilaterally, normal work of breathing Heart- irregular rate and rhythm, no murmurs, rubs or gallops, PMI not laterally displaced GI- soft, NT, ND, + BS Extremities- no clubbing, cyanosis, or edema  ekg today reveals afib, v rate 75 bpm  Assessment and Plan:  Atrial fibrillation  Asymptomatic longstanding persistent afib Continue rate control Anticoagulated with pradaxa at 75 mg bid Bmet, cbc followed by Dr Virgina Jock  Chronic diastolic heart failure   2 gram sodium restriction advised Given renal failure,  Daily weights and sodium restriction advised  Bradycardia  resolved  Return in 12 months  Thompson Grayer MD, [facility] 09/18/2015 12:51 PM

## 2015-10-13 ENCOUNTER — Other Ambulatory Visit: Payer: Self-pay | Admitting: Orthopedic Surgery

## 2015-10-13 DIAGNOSIS — M25561 Pain in right knee: Secondary | ICD-10-CM

## 2015-10-16 ENCOUNTER — Other Ambulatory Visit (HOSPITAL_COMMUNITY): Payer: Self-pay | Admitting: Orthopedic Surgery

## 2015-10-16 ENCOUNTER — Telehealth: Payer: Self-pay | Admitting: Internal Medicine

## 2015-10-16 DIAGNOSIS — M25561 Pain in right knee: Secondary | ICD-10-CM

## 2015-10-16 NOTE — Telephone Encounter (Signed)
PT'S DTR LISA CALLING TO GET ELIQUIS CHANGED TO XARELTO DUE TO INSURANCE COPAY GOING UP TO 95 A MONTH AFTER A 165 DEDUCTIBLE-USES [facility]

## 2015-10-17 ENCOUNTER — Ambulatory Visit (HOSPITAL_COMMUNITY)
Admission: RE | Admit: 2015-10-17 | Discharge: 2015-10-17 | Disposition: A | Discharge status: Home / Self Care | Payer: Medicare Other | Source: Ambulatory Visit | Attending: Orthopedic Surgery | Admitting: Orthopedic Surgery

## 2015-10-17 DIAGNOSIS — S83281A Other tear of lateral meniscus, current injury, right knee, initial encounter: Secondary | ICD-10-CM | POA: Diagnosis not present

## 2015-10-17 DIAGNOSIS — M1711 Unilateral primary osteoarthritis, right knee: Secondary | ICD-10-CM | POA: Diagnosis not present

## 2015-10-17 DIAGNOSIS — M25461 Effusion, right knee: Secondary | ICD-10-CM | POA: Insufficient documentation

## 2015-10-17 DIAGNOSIS — M948X6 Other specified disorders of cartilage, lower leg: Secondary | ICD-10-CM | POA: Insufficient documentation

## 2015-10-17 DIAGNOSIS — M659 Synovitis and tenosynovitis, unspecified: Secondary | ICD-10-CM | POA: Insufficient documentation

## 2015-10-17 DIAGNOSIS — M25561 Pain in right knee: Secondary | ICD-10-CM | POA: Insufficient documentation

## 2015-10-18 MED ORDER — APIXABAN 2.5 MG PO TABS: 2.5000 mg | ORAL_TABLET | Freq: Two times a day (BID) | ORAL | Status: DC

## 2015-10-18 NOTE — Telephone Encounter (Signed)
Discussed with Dr Rayann Heman and will change her Pradaxa to Eliquis 2.5 mg bid.  Daughter aware

## 2015-10-19 ENCOUNTER — Telehealth: Payer: Self-pay | Admitting: Internal Medicine

## 2015-10-19 MED ORDER — DABIGATRAN ETEXILATE MESYLATE 75 MG PO CAPS
75.0000 mg | ORAL_CAPSULE | Freq: Two times a day (BID) | ORAL | Status: DC
Start: 2015-10-19 — End: 2016-08-13

## 2015-10-19 NOTE — Telephone Encounter (Signed)
Spoke to the daughter and they had to pay a $160 co pay as the drug is a tier 4 and then it is $95 per month.  When she got to pharmacy to pick up the Eliquis she was told it had to have a PA.  So she and her family decide at that point to stay on Pradaxa and paid $255.  She will stay on Pradaxa for now.  I let them know how sorry I was and they appreciate all we have done to help them

## 2015-10-19 NOTE — Telephone Encounter (Signed)
New message    Lattie Haw calling   sister went to pharmacy to  pick up eliquis -  issues with  pre authorization from insurance company - sister went ahead an refill the pradaxa at cost of   $ 160.00 copay .    Family decided to leave mom on praxada .    Would like a call back to discuss further.

## 2015-10-20 ENCOUNTER — Telehealth: Payer: Self-pay | Admitting: Internal Medicine

## 2015-10-20 NOTE — Telephone Encounter (Signed)
New Message  Request for surgical clearance:  What type of surgery is being performed? Torn meniscus repaired   1. When is this surgery scheduled? 10/25/2015  2. Are there any medications that need to be held prior to surgery and how long? Pradaxa held for 5 days. To have it stopped today. Ask Doctor alread if this is ok or will it need to be longer/   3. Name of physician performing surgery? Dr. [facility] [place]  4. What is your office phone and fax number? Office number [phone]  ( Not sure what the fax # is)    Comments: Dr. Ruel Favors office wouldn't call to get he clearance for the pt. Pt daughter has called 3 times, so we have decided to send the request through for her.  Daughter is requesting a call back first before the clearance to Dr. Ruel Favors office.

## 2015-10-20 NOTE — Telephone Encounter (Addendum)
Daughter Sonya Kaufman) calling.  Spoke w/ her after receiving permission from Sonya Kaufman to speak with her.  Lattie Haw states that Sonya Kaufman is scheduled for a torn Meniscus repair on 1/25 and Dr. Ruel Favors office wanted them to call our office to find out when pt could stop Pradaxa. Advised her that Dr. Ruel Favors office should be calling us which she said she tried to tell them that they should be calling.  Advised her that she can stop Pradaxa 3 days prior to surgery. Also advised will call Dr. Ruel Favors office to make them aware that can stop Pradaxa 3 days prior. Did speak w/Dr. Ruel Favors office and made them aware of Pradaxa and also advised them that they need to send a request or call our office if need information-the pt should not have that responsibility.  She will make the office aware.

## 2015-10-21 ENCOUNTER — Other Ambulatory Visit: Payer: Medicare Other

## 2015-11-01 ENCOUNTER — Ambulatory Visit: Payer: Medicare Other | Attending: Orthopedic Surgery

## 2015-12-03 ENCOUNTER — Emergency Department (HOSPITAL_COMMUNITY): Payer: Medicare Other

## 2015-12-03 ENCOUNTER — Emergency Department (HOSPITAL_COMMUNITY)
Admission: EM | Admit: 2015-12-03 | Discharge: 2015-12-03 | Disposition: A | Discharge status: Home / Self Care | Payer: Medicare Other | Attending: Emergency Medicine | Admitting: Emergency Medicine

## 2015-12-03 ENCOUNTER — Encounter (HOSPITAL_COMMUNITY): Payer: Self-pay | Admitting: Emergency Medicine

## 2015-12-03 DIAGNOSIS — Z86018 Personal history of other benign neoplasm: Secondary | ICD-10-CM | POA: Diagnosis not present

## 2015-12-03 DIAGNOSIS — R009 Unspecified abnormalities of heart beat: Secondary | ICD-10-CM | POA: Insufficient documentation

## 2015-12-03 DIAGNOSIS — Y9301 Activity, walking, marching and hiking: Secondary | ICD-10-CM | POA: Diagnosis not present

## 2015-12-03 DIAGNOSIS — E663 Overweight: Secondary | ICD-10-CM | POA: Insufficient documentation

## 2015-12-03 DIAGNOSIS — E785 Hyperlipidemia, unspecified: Secondary | ICD-10-CM | POA: Insufficient documentation

## 2015-12-03 DIAGNOSIS — Y999 Unspecified external cause status: Secondary | ICD-10-CM | POA: Diagnosis not present

## 2015-12-03 DIAGNOSIS — Z7901 Long term (current) use of anticoagulants: Secondary | ICD-10-CM | POA: Insufficient documentation

## 2015-12-03 DIAGNOSIS — Z87448 Personal history of other diseases of urinary system: Secondary | ICD-10-CM | POA: Diagnosis not present

## 2015-12-03 DIAGNOSIS — F329 Major depressive disorder, single episode, unspecified: Secondary | ICD-10-CM | POA: Insufficient documentation

## 2015-12-03 DIAGNOSIS — S3991XA Unspecified injury of abdomen, initial encounter: Secondary | ICD-10-CM | POA: Insufficient documentation

## 2015-12-03 DIAGNOSIS — Z8739 Personal history of other diseases of the musculoskeletal system and connective tissue: Secondary | ICD-10-CM | POA: Insufficient documentation

## 2015-12-03 DIAGNOSIS — Z8679 Personal history of other diseases of the circulatory system: Secondary | ICD-10-CM | POA: Diagnosis not present

## 2015-12-03 DIAGNOSIS — Y9289 Other specified places as the place of occurrence of the external cause: Secondary | ICD-10-CM | POA: Insufficient documentation

## 2015-12-03 DIAGNOSIS — S4991XA Unspecified injury of right shoulder and upper arm, initial encounter: Secondary | ICD-10-CM | POA: Diagnosis present

## 2015-12-03 DIAGNOSIS — Z8719 Personal history of other diseases of the digestive system: Secondary | ICD-10-CM | POA: Insufficient documentation

## 2015-12-03 DIAGNOSIS — S40021A Contusion of right upper arm, initial encounter: Secondary | ICD-10-CM | POA: Diagnosis not present

## 2015-12-03 DIAGNOSIS — E039 Hypothyroidism, unspecified: Secondary | ICD-10-CM | POA: Diagnosis not present

## 2015-12-03 DIAGNOSIS — I5032 Chronic diastolic (congestive) heart failure: Secondary | ICD-10-CM | POA: Diagnosis not present

## 2015-12-03 DIAGNOSIS — Z79899 Other long term (current) drug therapy: Secondary | ICD-10-CM | POA: Diagnosis not present

## 2015-12-03 DIAGNOSIS — W1789XA Other fall from one level to another, initial encounter: Secondary | ICD-10-CM | POA: Insufficient documentation

## 2015-12-03 DIAGNOSIS — W19XXXA Unspecified fall, initial encounter: Secondary | ICD-10-CM

## 2015-12-03 NOTE — ED Provider Notes (Signed)
CSN: [id number]     Arrival date & time 12/03/15  0425 History   First MD Initiated Contact with Patient 12/03/15 [phone]     Chief Complaint  Patient presents with  . Fall  . Arm Pain      Patient is a 82 y.o. female presenting with fall and arm pain. The history is provided by the patient.  Fall This is a new problem. Pertinent negatives include no chest pain, no abdominal pain and no shortness of breath.  Arm Pain Pertinent negatives include no chest pain, no abdominal pain and no shortness of breath.   patient presents after fall. She walks with a walker since her knee surgery and reportedly fell forward. Thinks she lost her balance. She is not that she lost consciousnes.. Complaining of pain in Her right arm now. No headache. No confusion. No neck pain. Abdominal pain. No chest pain. She did have difficulty getting up. She is on Pradaxa for atrial fibrillation.  Past Medical History  Diagnosis Date  . Diastolic CHF, chronic ([place])   . Other and unspecified hyperlipidemia     EF 60-65% by echo 11/2011  . Unspecified hypothyroidism   . Overweight(278.02)   . Diverticulosis of colon (without mention of hemorrhage)   . Benign neoplasm of colon   . Osteoarthrosis, unspecified whether generalized or localized, unspecified site   . Disorder of bone and cartilage, unspecified   . Depressive disorder, not elsewhere classified   . Venous insufficiency   . Renal insufficiency   . Persistent atrial fibrillation ([place])   . Atrial flutter ([place])   . Tachycardia-bradycardia [facility])    Past Surgical History  Procedure Laterality Date  . Tonsillectomy     Family History  Problem Relation Age of Onset  . Heart failure Mother   . Arthritis Mother   . Heart disease Father   . Heart disease Other    Social History  Substance Use Topics  . Smoking status: Never Smoker   . Smokeless tobacco: Never Used  . Alcohol Use: No   OB History    No data available     Review of Systems   Constitutional: Negative for appetite change.  Respiratory: Negative for shortness of breath.   Cardiovascular: Negative for chest pain.  Gastrointestinal: Negative for abdominal pain.  Genitourinary: Negative for flank pain.  Musculoskeletal: Positive for gait problem. Negative for neck pain and neck stiffness.  Neurological: Negative for speech difficulty and weakness.      Allergies  Review of patient's allergies indicates no known allergies.  Home Medications   Prior to Admission medications   Medication Sig Start Date End Date Taking? Authorizing Provider  buPROPion (WELLBUTRIN XL) 300 MG 24 hr tablet Take 300 mg by mouth daily.     Yes Historical Provider, MD  dabigatran (PRADAXA) 75 MG CAPS capsule Take 1 capsule (75 mg total) by mouth 2 (two) times daily. 10/19/15  Yes Thompson Grayer, MD  escitalopram (LEXAPRO) 20 MG tablet Take 20 mg by mouth daily.    Yes Historical Provider, MD  furosemide (LASIX) 20 MG tablet Take 20-40 mg by mouth 2 (two) times daily. Take 2 tablets by mouth in the AM and 1 tablet in the evening. May take 1 extra tablet daily as needed for weight gain   Yes Historical Provider, MD  lithium carbonate (LITHOBID) 300 MG CR tablet Take 300 mg by mouth daily.   Yes Historical Provider, MD  metoprolol succinate (TOPROL-XL) 25 MG 24 hr tablet TAKE  1 TABLET BY MOUTH 2 TIMES DAILY 04/19/15  Yes Thompson Grayer, MD  OLANZapine (ZYPREXA) 2.5 MG tablet Take 1.25 mg by mouth daily. 1/2 TAB PO QD 11/21/11  Yes Dayna N Dunn, PA-C  potassium chloride SA (K-DUR,KLOR-CON) 20 MEQ tablet Take 1 tablet (20 mEq total) by mouth daily. 04/06/15  Yes Thompson Grayer, MD  simvastatin (ZOCOR) 20 MG tablet Take 20 mg by mouth daily.   Yes Historical Provider, MD  SYNTHROID 112 MCG tablet Take 1 tablet by mouth daily before breakfast. 03/23/15  Yes Historical Provider, MD   BP 128/67 mmHg  Pulse 81  Temp(Src) 98.2 F (36.8 C) (Oral)  Resp 16  SpO2 97% Physical Exam  Constitutional: She  appears well-developed.  HENT:  Head: Atraumatic.  No visible trauma to head.  Eyes: EOM are normal.  Neck: Neck supple.  No cervical spine tenderness.  Cardiovascular:  Irregular rhythm  Pulmonary/Chest: Effort normal. She exhibits no tenderness.  Abdominal: There is no tenderness.  Musculoskeletal: She exhibits tenderness.  Tenderness to right mid to proximal humerus without severe deformity. Neurovascularly intact over right hand. No tenderness over hips pelvis or lower extremities.    ED Course  Procedures (including critical care time) Labs Review Labs Reviewed - No data to display  Imaging Review Ct Head Wo Contrast  12/03/2015  CLINICAL DATA:  Status post unwitnessed fall. Concern for head injury. Initial encounter. EXAM: CT HEAD WITHOUT CONTRAST TECHNIQUE: Contiguous axial images were obtained from the base of the skull through the vertex without intravenous contrast. COMPARISON:  CT of the head performed 10/05/2010 FINDINGS: There is no evidence of acute infarction, mass lesion, or intra- or extra-axial hemorrhage on CT. Prominence of the sulci suggests mild cortical volume loss. Scattered periventricular and subcortical white matter change likely reflects small vessel ischemic microangiopathy. Mild cerebellar atrophy is noted. The brainstem and fourth ventricle are within normal limits. The basal ganglia are unremarkable in appearance. The cerebral hemispheres demonstrate grossly normal gray-white differentiation. No mass effect or midline shift is seen. There is no evidence of fracture; visualized osseous structures are unremarkable in appearance. The orbits are within normal limits. The paranasal sinuses and mastoid air cells are well-aerated. No significant soft tissue abnormalities are seen. IMPRESSION: 1. No acute intracranial pathology seen on CT. 2. Mild cortical volume loss and scattered small vessel ischemic microangiopathy. Electronically Signed   By: Garald Balding M.D.    On: 12/03/2015 06:08   Dg Humerus Right  12/03/2015  CLINICAL DATA:  Status post fall onto right arm, with right upper arm pain. Initial encounter. EXAM: RIGHT HUMERUS - 2+ VIEW COMPARISON:  None. FINDINGS: The right humerus appears intact. There is no evidence of fracture or dislocation. Degenerative change is noted at the right acromioclavicular joint. Soft tissue swelling is noted along the upper right arm. The elbow joint is incompletely assessed, but appears grossly unremarkable. IMPRESSION: No evidence of fracture or dislocation. Electronically Signed   By: Garald Balding M.D.   On: 12/03/2015 05:28   I have personally reviewed and evaluated these images and lab results as part of my medical decision-making.   EKG Interpretation None      MDM   Final diagnoses:  Fall, initial encounter  Arm contusion, right, initial encounter   Patient with a fall. negative CT and x-ray. Negative head CT but is on her DEXA. Discussed with patient about need for reevaluating anticoagulation with a fall. Will discharge home.     Davonna Belling, MD 12/03/15 [phone]

## 2015-12-03 NOTE — ED Notes (Signed)
Patient verbalized understanding of discharge instructions and denies any further needs or questions at this time. VS stable. Patient ambulatory with steady gait. Assisted to ED entrance in wheelchair.   

## 2015-12-03 NOTE — Discharge Instructions (Signed)

## 2015-12-03 NOTE — ED Notes (Signed)
PER [facility]: Patient to ED from home following unwitnessed fall. Patient stated that she woke up, started walking around with her walker and fell forward. Patient has had recent R knee surgery (5 weeks ago) and has had trouble with it lately. Patient reports that she hit her R arm on the floor first to break her fall and then her head hit the floor. Patient on Pradaxa, denies LOC or head/neck pain, no visible trauma. EMS VS: 132/84, HR 74 irregular A-Fib, 94% RA, CBG 137. Patient A&O x 4.

## 2015-12-15 ENCOUNTER — Other Ambulatory Visit: Payer: Self-pay | Admitting: Internal Medicine

## 2016-01-01 ENCOUNTER — Other Ambulatory Visit (HOSPITAL_COMMUNITY): Payer: Self-pay | Admitting: Sports Medicine

## 2016-01-01 DIAGNOSIS — M25511 Pain in right shoulder: Secondary | ICD-10-CM

## 2016-01-03 ENCOUNTER — Ambulatory Visit (HOSPITAL_COMMUNITY)
Admission: RE | Admit: 2016-01-03 | Discharge: 2016-01-03 | Disposition: A | Discharge status: Home / Self Care | Payer: Medicare Other | Source: Ambulatory Visit | Attending: Sports Medicine | Admitting: Sports Medicine

## 2016-01-03 DIAGNOSIS — M25511 Pain in right shoulder: Secondary | ICD-10-CM | POA: Diagnosis present

## 2016-01-03 DIAGNOSIS — M7551 Bursitis of right shoulder: Secondary | ICD-10-CM | POA: Insufficient documentation

## 2016-01-03 DIAGNOSIS — M75121 Complete rotator cuff tear or rupture of right shoulder, not specified as traumatic: Secondary | ICD-10-CM | POA: Insufficient documentation

## 2016-01-03 DIAGNOSIS — M19011 Primary osteoarthritis, right shoulder: Secondary | ICD-10-CM | POA: Insufficient documentation

## 2016-01-09 ENCOUNTER — Ambulatory Visit (HOSPITAL_COMMUNITY): Payer: Medicare Other

## 2016-03-21 ENCOUNTER — Other Ambulatory Visit: Payer: Self-pay | Admitting: Internal Medicine

## 2016-03-21 NOTE — Telephone Encounter (Signed)
Please clarify lasix dosage, several different instructions listed, thanks

## 2016-03-25 NOTE — Telephone Encounter (Signed)
Call patient and verify how she is taking and fill accordingly.

## 2016-03-26 NOTE — Telephone Encounter (Signed)
Sonya Kaufman  09/18/2015 12:15 PM  Office Visit  MRN:  [id number]   Description: Female DOB: [birth year]  Provider: Thompson Grayer, MD  Department: [facility] [address] [facility]       Vital Signs  Most recent update: 09/18/2015 12:35 PM by Jordan Likes, CMA    furosemide (LASIX) 20 MG tablet, Take 2 tablets by mouth in the AM and 1 tablet in the evening. May take 1 extra tablet daily as needed for weight gain  called to verify lasix dosage with pt, per pt she is taking lasix 2 tabs in the am & 1 tab in the PM & may take an extra tab PRN based on weight gain, this is per pt verbal instructions from bottle.

## 2016-04-17 ENCOUNTER — Other Ambulatory Visit: Payer: Self-pay | Admitting: Internal Medicine

## 2016-04-30 ENCOUNTER — Telehealth: Payer: Self-pay | Admitting: Internal Medicine

## 2016-04-30 NOTE — Telephone Encounter (Signed)
Patient calling the office for samples of medication:   1.  What medication and dosage are you requesting samples for? Perdaxa 75mg  2.  Are you currently out of this medication?  Donut hole

## 2016-05-01 ENCOUNTER — Encounter: Payer: Self-pay | Admitting: Internal Medicine

## 2016-05-01 NOTE — Telephone Encounter (Signed)
4 boxes of pradaxa placed up front

## 2016-05-09 ENCOUNTER — Other Ambulatory Visit: Payer: Self-pay | Admitting: Internal Medicine

## 2016-05-10 ENCOUNTER — Other Ambulatory Visit: Payer: Self-pay | Admitting: *Deleted

## 2016-05-10 MED ORDER — POTASSIUM CHLORIDE CRYS ER 20 MEQ PO TBCR: 20.0000 meq | EXTENDED_RELEASE_TABLET | Freq: Every day | ORAL | 3 refills | Status: DC

## 2016-06-12 ENCOUNTER — Telehealth: Payer: Self-pay | Admitting: Internal Medicine

## 2016-06-12 NOTE — Telephone Encounter (Signed)
New message   Patient calling the office for samples of medication:   1.  What medication and dosage are you requesting samples for? Paradoxa 75mg   2.  Are you currently out of this medication? no

## 2016-06-12 NOTE — Telephone Encounter (Signed)
called to let Carson to spot by the office and pick up a DPR form & PA form, She expressed understanding. 2 boxes of samples placed up front as well, she will be surprised :)

## 2016-06-13 ENCOUNTER — Telehealth: Payer: Self-pay | Admitting: Internal Medicine

## 2016-06-13 NOTE — Telephone Encounter (Signed)
F/u    Pts dtr would like Katie to call her if samples are available for Praxda 75 mg for her mom so she cam come and pick them up this afternoon.

## 2016-06-13 NOTE — Telephone Encounter (Signed)
New message    Patient calling the office for samples of medication:   1.  What medication and dosage are you requesting samples for?  Praxda 75 mg 2.  Are you currently out of this medication? Yes   Pt does not have any for tonight. Pt will come by and get the sample around 4:30.

## 2016-06-13 NOTE — Telephone Encounter (Signed)
Spoke with daughter and have left samples out front.

## 2016-08-13 ENCOUNTER — Telehealth: Payer: Self-pay | Admitting: Internal Medicine

## 2016-08-13 MED ORDER — DABIGATRAN ETEXILATE MESYLATE 75 MG PO CAPS: 75.0000 mg | ORAL_CAPSULE | Freq: Two times a day (BID) | ORAL | 5 refills | Status: DC

## 2016-08-13 NOTE — Telephone Encounter (Signed)
New message  Patient calling the office for samples of medication:   1.  What medication and dosage are you requesting samples for? perdaxa 75mg   2.  Are you currently out of this medication? 6 days left

## 2016-08-16 ENCOUNTER — Other Ambulatory Visit: Payer: Self-pay | Admitting: Internal Medicine

## 2016-09-11 ENCOUNTER — Encounter: Payer: Self-pay | Admitting: Internal Medicine

## 2016-09-18 ENCOUNTER — Ambulatory Visit (INDEPENDENT_AMBULATORY_CARE_PROVIDER_SITE_OTHER): Payer: Medicare Other | Admitting: Internal Medicine

## 2016-09-18 ENCOUNTER — Encounter (INDEPENDENT_AMBULATORY_CARE_PROVIDER_SITE_OTHER): Payer: Self-pay

## 2016-09-18 ENCOUNTER — Encounter: Payer: Self-pay | Admitting: Internal Medicine

## 2016-09-18 VITALS — BP 122/74 | HR 80 | Ht 67.0 in | Wt 212.2 lb

## 2016-09-18 DIAGNOSIS — I5032 Chronic diastolic (congestive) heart failure: Secondary | ICD-10-CM

## 2016-09-18 DIAGNOSIS — I482 Chronic atrial fibrillation, unspecified: Secondary | ICD-10-CM

## 2016-09-18 MED ORDER — POTASSIUM CHLORIDE CRYS ER 20 MEQ PO TBCR: 20.0000 meq | EXTENDED_RELEASE_TABLET | Freq: Every day | ORAL | 3 refills | Status: DC

## 2016-09-18 MED ORDER — METOPROLOL SUCCINATE ER 25 MG PO TB24: 25.0000 mg | ORAL_TABLET | Freq: Two times a day (BID) | ORAL | 3 refills | Status: DC

## 2016-09-18 NOTE — Patient Instructions (Addendum)
Medication Instructions:  Your physician recommends that you continue on your current medications as directed. Please refer to the Current Medication list given to you today.   Labwork: None ordered   Testing/Procedures: None ordered   Follow-Up: Your physician wants you to follow-up in: 12 months with Dr Rayann Heman Dennis Bast will receive a reminder letter in the mail two months in advance. If you don't receive a letter, please call our office to schedule the follow-up appointment.   Any Other Special Instructions Will Be Listed Below (If Applicable).     Low-Sodium Eating Plan Sodium raises blood pressure and causes water to be held in the body. Getting less sodium from food will help lower your blood pressure, reduce any swelling, and protect your heart, liver, and kidneys. We get sodium by adding salt (sodium chloride) to food. Most of our sodium comes from canned, boxed, and frozen foods. Restaurant foods, fast foods, and pizza are also very high in sodium. Even if you take medicine to lower your blood pressure or to reduce fluid in your body, getting less sodium from your food is important. What is my plan? Most people should limit their sodium intake to 2,300 mg a day. Your health care provider recommends that you limit your sodium intake to 2,000 mg a day. What do I need to know about this eating plan? For the low-sodium eating plan, you will follow these general guidelines:  Choose foods with a % Daily Value for sodium of less than 5% (as listed on the food label).  Use salt-free seasonings or herbs instead of table salt or sea salt.  Check with your health care provider or pharmacist before using salt substitutes.  Eat fresh foods.  Eat more vegetables and fruits.  Limit canned vegetables. If you do use them, rinse them well to decrease the sodium.  Limit cheese to 1 oz (28 g) per day.  Eat lower-sodium products, often labeled as "lower sodium" or "no salt added."  Avoid  foods that contain monosodium glutamate (MSG). MSG is sometimes added to [place] food and some canned foods.  Check food labels (Nutrition Facts labels) on foods to learn how much sodium is in one serving.  Eat more home-cooked food and less restaurant, buffet, and fast food.  When eating at a restaurant, ask that your food be prepared with less salt, or no salt if possible. How do I read food labels for sodium information? The Nutrition Facts label lists the amount of sodium in one serving of the food. If you eat more than one serving, you must multiply the listed amount of sodium by the number of servings. Food labels may also identify foods as:  Sodium free-Less than 5 mg in a serving.  Very low sodium-35 mg or less in a serving.  Low sodium-140 mg or less in a serving.  Light in sodium-50% less sodium in a serving. For example, if a food that usually has 300 mg of sodium is changed to become light in sodium, it will have 150 mg of sodium.  Reduced sodium-25% less sodium in a serving. For example, if a food that usually has 400 mg of sodium is changed to reduced sodium, it will have 300 mg of sodium. What foods can I eat? Grains  Low-sodium cereals, including oats, puffed wheat and rice, and shredded wheat cereals. Low-sodium crackers. Unsalted rice and pasta. Lower-sodium bread. Vegetables  Frozen or fresh vegetables. Low-sodium or reduced-sodium canned vegetables. Low-sodium or reduced-sodium tomato sauce and paste. Low-sodium  or reduced-sodium tomato and vegetable juices. Fruits  Fresh, frozen, and canned fruit. Fruit juice. Meat and Other Protein Products  Low-sodium canned tuna and salmon. Fresh or frozen meat, poultry, seafood, and fish. Lamb. Unsalted nuts. Dried beans, peas, and lentils without added salt. Unsalted canned beans. Homemade soups without salt. Eggs. Dairy  Milk. Soy milk. Ricotta cheese. Low-sodium or reduced-sodium cheeses. Yogurt. Condiments  Fresh and dried  herbs and spices. Salt-free seasonings. Onion and garlic powders. Low-sodium varieties of mustard and ketchup. Fresh or refrigerated horseradish. Lemon juice. Fats and Oils  Reduced-sodium salad dressings. Unsalted butter. Other  Unsalted popcorn and pretzels. The items listed above may not be a complete list of recommended foods or beverages. Contact your dietitian for more options.  What foods are not recommended? Grains  Instant hot cereals. Bread stuffing, pancake, and biscuit mixes. Croutons. Seasoned rice or pasta mixes. Noodle soup cups. Boxed or frozen macaroni and cheese. Self-rising flour. Regular salted crackers. Vegetables  Regular canned vegetables. Regular canned tomato sauce and paste. Regular tomato and vegetable juices. Frozen vegetables in sauces. Salted [place] fries. Olives. Angie Fava. Relishes. Sauerkraut. Salsa. Meat and Other Protein Products  Salted, canned, smoked, spiced, or pickled meats, seafood, or fish. Bacon, ham, sausage, hot dogs, corned beef, chipped beef, and packaged luncheon meats. Salt pork. Jerky. Pickled herring. Anchovies, regular canned tuna, and sardines. Salted nuts. Dairy  Processed cheese and cheese spreads. Cheese curds. Blue cheese and cottage cheese. Buttermilk. Condiments  Onion and garlic salt, seasoned salt, table salt, and sea salt. Canned and packaged gravies. Worcestershire sauce. Tartar sauce. Barbecue sauce. Teriyaki sauce. Soy sauce, including reduced sodium. Steak sauce. Fish sauce. Oyster sauce. Cocktail sauce. Horseradish that you find on the shelf. Regular ketchup and mustard. Meat flavorings and tenderizers. Bouillon cubes. Hot sauce. Tabasco sauce. Marinades. Taco seasonings. Relishes. Fats and Oils  Regular salad dressings. Salted butter. Margarine. Ghee. Bacon fat. Other  Potato and tortilla chips. Corn chips and puffs. Salted popcorn and pretzels. Canned or dried soups. Pizza. Frozen entrees and pot pies. The items listed above may  not be a complete list of foods and beverages to avoid. Contact your dietitian for more information.  This information is not intended to replace advice given to you by your health care provider. Make sure you discuss any questions you have with your health care provider. Document Released: 03/08/2002 Document Revised: 02/22/2016 Document Reviewed: 07/21/2013 Elsevier Interactive Patient Education  2017 [employer].

## 2016-09-18 NOTE — Progress Notes (Signed)
PCP: Precious Reel, MD  Sonya Kaufman is a 82 y.o. female who presents today for routine cardiology follow up for longstanding persistent afib.   She is doing well at this time.  Her husband died 3 weeks ago and she continues to grieve.  She has stable edema/ venous insufficiency.  Mild cough in the am.  SOB with moderate activity.  Today, she denies symptoms of palpitations, chest pain, dizziness, presyncope, or syncope.  The patient is otherwise without complaint today.   Past Medical History  Diagnosis Date  . Diastolic CHF, chronic   . Other and unspecified hyperlipidemia     EF 60-65% by echo 11/2011  . Unspecified hypothyroidism   . Overweight(278.02)   . Diverticulosis of colon (without mention of hemorrhage)   . Benign neoplasm of colon   . Osteoarthrosis, unspecified whether generalized or localized, unspecified site   . Disorder of bone and cartilage, unspecified   . Depressive disorder, not elsewhere classified   . Venous insufficiency   . Renal insufficiency   . Paroxysmal atrial fibrillation   . Atrial flutter   . Tachycardia-bradycardia    Past Surgical History  Procedure Laterality Date  . Tonsillectomy      Current Outpatient Prescriptions:  .  buPROPion (WELLBUTRIN XL) 300 MG 24 hr tablet, Take 300 mg by mouth daily.  , Disp: , Rfl:  .  dabigatran (PRADAXA) 75 MG CAPS capsule, Take 1 capsule (75 mg total) by mouth 2 (two) times daily., Disp: 60 capsule, Rfl: 5 .  escitalopram (LEXAPRO) 20 MG tablet, Take 20 mg by mouth daily. , Disp: , Rfl:  .  furosemide (LASIX) 20 MG tablet, Take 2 tablets by mouth in the morning and 1 tablet by mouth in the evening.May take 1 extra tablet by mouth daily as needed for weight gain, Disp: 270 tablet, Rfl: 0 .  lithium carbonate (LITHOBID) 300 MG CR tablet, Take 300 mg by mouth daily., Disp: , Rfl:  .  metoprolol succinate (TOPROL-XL) 25 MG 24 hr tablet, TAKE ONE TABLET BY MOUTH TWICE DAILY, Disp: 180 tablet, Rfl: 1 .  OLANZapine  (ZYPREXA) 2.5 MG tablet, Take 1.25 mg by mouth daily. 1/2 TAB PO QD, Disp: , Rfl:  .  potassium chloride SA (K-DUR,KLOR-CON) 20 MEQ tablet, Take 1 tablet (20 mEq total) by mouth daily., Disp: 30 tablet, Rfl: 3 .  simvastatin (ZOCOR) 20 MG tablet, Take 20 mg by mouth daily., Disp: , Rfl:  .  SYNTHROID 112 MCG tablet, Take 1 tablet by mouth daily before breakfast., Disp: , Rfl:    ROS- all systems are reviewed and negative except as per HPI above  Physical Exam: Vitals:   09/18/16 1431  BP: 122/74  Pulse: 80   GEN- The patient is well appearing, alert and oriented x 3 today.   Head- normocephalic, atraumatic Eyes-  Sclera clear, conjunctiva pink Ears- hearing intact Oropharynx- clear Lungs- Clear to ausculation bilaterally, normal work of breathing Heart- irregular rate and rhythm, no murmurs, rubs or gallops, PMI not laterally displaced GI- soft, NT, ND, + BS Extremities- no clubbing, cyanosis, trace edema  ekg today reveals afib, v rate 80 bpm  Assessment and Plan:  Atrial fibrillation  Asymptomatic permanent afib Continue rate control Anticoagulated with pradaxa at 75 mg bid Bmet, cbc followed by Dr Virgina Jock  Chronic diastolic heart failure   2 gram sodium restriction advised Daily weights and sodium restriction advised  Return in 12 months  Thompson Grayer MD, [facility] 09/18/2016 2:46 PM

## 2016-11-05 ENCOUNTER — Other Ambulatory Visit: Payer: Self-pay | Admitting: Internal Medicine

## 2017-04-22 ENCOUNTER — Telehealth: Payer: Self-pay | Admitting: Internal Medicine

## 2017-04-22 NOTE — Telephone Encounter (Signed)
Called pt and spoke with pt's daughter informing her that I was leaving 3 boxes of Pradaxa 75 mg tablet, a 3 weeks supply, at the front desk for pt's daughter to pick up and if the pt has any other problems, questions or concerns to call the office. Daughter verbalized understanding.

## 2017-04-22 NOTE — Telephone Encounter (Signed)
New message    Pt daughter is calling for pt.   Patient calling the office for samples of medication:   1.  What medication and dosage are you requesting samples for? Pradaxa 75 mg  2.  Are you currently out of this medication? Pt daughter states that her mother has some, but she is in the donut hole and was wondering if there was samples to help her out.

## 2017-06-03 ENCOUNTER — Other Ambulatory Visit: Payer: Self-pay | Admitting: Internal Medicine

## 2017-06-05 ENCOUNTER — Other Ambulatory Visit: Payer: Self-pay | Admitting: *Deleted

## 2017-06-05 NOTE — Telephone Encounter (Addendum)
Request received for Pradaxa 75mg ; pt is 83 yrs old, wt-96.3kg, CrCl-37.47ml/min, last seen by Dr. Rayann Heman on 09/18/16. Per dosing criteria pt needs to be on Pradaxa 150mg , sent a message to Dr. Rayann Heman regarding this & will await his recommendations as pt has been on Pradaxa 75mg  BID since 2012. Will send in a month supply as pt is requesting a refill.  Spoke with pt & made aware that a month supply was being sent in at this time & once communicate with Dr. Rayann Heman regarding dosing, will send in more or change dose.

## 2017-06-18 ENCOUNTER — Telehealth: Payer: Self-pay | Admitting: *Deleted

## 2017-06-18 MED ORDER — DABIGATRAN ETEXILATE MESYLATE 75 MG PO CAPS: 75.0000 mg | ORAL_CAPSULE | Freq: Two times a day (BID) | ORAL | 5 refills | Status: DC

## 2017-06-18 NOTE — Telephone Encounter (Signed)
-----   Message from Thompson Grayer, MD sent at 06/18/2017 10:28 AM EDT ----- I would keep her on her current dose I think.  I worry about her bleeding risks.   ----- Message ----- From: Marcos Eke, RN Sent: 06/04/2017   2:22 PM To: Thompson Grayer, MD  Good Afternoon! Received a Pradaxa 75mg  refill request on the pt & after requesting labs from PCP with an updated Creatinine her CrCl was  37.56ml/min.  Therefore, per dosing criteria she should be on Pradaxa 150mg .  The pt has been on Pradaxa 75mg  for awhile and looks as if she has been on Pradaxa 150mg  when she initially started Pradaxa. Please advise on what dose you would prefer the pt to be on.  Thanks

## 2017-07-31 ENCOUNTER — Telehealth: Payer: Self-pay | Admitting: Internal Medicine

## 2017-07-31 NOTE — Telephone Encounter (Signed)
Called pt and spoke with pt's daughter Sonya Kaufman, informing her that I was leaving 3 weeks supply of Pradaxa 75 mg tablet and pt assistance forms for pt to fill out and bring back to the office for Shela Leff, prior [name] coordinator and if pt has any other problems, questions or concerns to call our office. Pt's daughter verbalized understanding.

## 2017-07-31 NOTE — Telephone Encounter (Signed)
Patient calling the office for samples of medication:   1.  What medication and dosage are you requesting samples for?Pradaxa 75 mg   2.  Are you currently out of this medication? Patient is in the [facility]

## 2017-09-29 ENCOUNTER — Encounter: Payer: Self-pay | Admitting: Internal Medicine

## 2017-09-29 ENCOUNTER — Ambulatory Visit (INDEPENDENT_AMBULATORY_CARE_PROVIDER_SITE_OTHER): Payer: Medicare Other | Admitting: Internal Medicine

## 2017-09-29 VITALS — BP 112/68 | HR 82 | Ht 67.0 in | Wt 211.6 lb

## 2017-09-29 DIAGNOSIS — I482 Chronic atrial fibrillation, unspecified: Secondary | ICD-10-CM

## 2017-09-29 DIAGNOSIS — I5032 Chronic diastolic (congestive) heart failure: Secondary | ICD-10-CM | POA: Diagnosis not present

## 2017-09-29 NOTE — Progress Notes (Signed)
PCP: Shon Baton, MD   Primary EP: Dr Rayann Heman  Sonya Kaufman is a 83 y.o. female who presents today for routine electrophysiology followup.  Since last being seen in our clinic, the patient reports doing reasonably well.  She has early morning cough.  SOB is stable.  She is not very active. Today, she denies symptoms of palpitations, chest pain, lower extremity edema, dizziness, presyncope, or syncope.  The patient is otherwise without complaint today.   Past Medical History:  Diagnosis Date  . Atrial flutter ([place])   . Benign neoplasm of colon   . Depressive disorder, not elsewhere classified   . Diastolic CHF, chronic ([place])   . Disorder of bone and cartilage, unspecified   . Diverticulosis of colon (without mention of hemorrhage)   . Osteoarthrosis, unspecified whether generalized or localized, unspecified site   . Other and unspecified hyperlipidemia    EF 60-65% by echo 11/2011  . Overweight(278.02)   . Persistent atrial fibrillation ([place])   . Renal insufficiency   . Tachycardia-bradycardia ([place])   . Unspecified hypothyroidism   . Venous insufficiency    Past Surgical History:  Procedure Laterality Date  . TONSILLECTOMY      ROS- all systems are reviewed and negatives except as per HPI above  Current Outpatient Medications  Medication Sig Dispense Refill  . buPROPion (WELLBUTRIN XL) 300 MG 24 hr tablet Take 300 mg by mouth daily.      . dabigatran (PRADAXA) 75 MG CAPS capsule Take 1 capsule (75 mg total) by mouth 2 (two) times daily. 60 capsule 5  . escitalopram (LEXAPRO) 20 MG tablet Take 40 mg by mouth daily.     . furosemide (LASIX) 20 MG tablet TAKE 2 TABLETS BY MOUTH IN THE MORNING AND 1 TABLET IN THE EVENING. MAY TAKE 1 EXTRA TABLET DAILY AS NEEDED FOR WEIGHT GAIN 315 tablet 3  . lithium carbonate (LITHOBID) 300 MG CR tablet Take 300 mg by mouth daily.    . metoprolol succinate (TOPROL-XL) 25 MG 24 hr tablet Take 1 tablet (25 mg total) by mouth 2 (two) times daily.  180 tablet 3  . OLANZapine (ZYPREXA) 2.5 MG tablet Take 2.5 mg by mouth daily. 1/2 TAB PO QD    . potassium chloride SA (K-DUR,KLOR-CON) 20 MEQ tablet Take 1 tablet (20 mEq total) by mouth daily. 90 tablet 3  . simvastatin (ZOCOR) 20 MG tablet Take 20 mg by mouth daily.    Marland Kitchen SYNTHROID 112 MCG tablet Take 1 tablet by mouth daily before breakfast.     No current facility-administered medications for this visit.     Physical Exam: Vitals:   09/29/17 1426  BP: 112/68  Pulse: 82  Weight: 211 lb 9.6 oz (96 kg)  Height: 5\' 7"  (1.702 m)    GEN- The patient is well appearing, alert and oriented x 3 today.   Head- normocephalic, atraumatic Eyes-  Sclera clear, conjunctiva pink Ears- hearing intact Oropharynx- clear Lungs- Clear to ausculation bilaterally, normal work of breathing Heart- irregular rate and rhythm, no murmurs, rubs or gallops, PMI not laterally displaced GI- soft, NT, ND, + BS Extremities- no clubbing, cyanosis, or edema  EKG tracing ordered today is personally reviewed and shows afib, V rate 82 bpm, nonspecific ST/T changes  Assessment and Plan:  1. Permanent afib Asymptomatic Rate controlled On pradaxa (Dr Virgina Jock follows labs)  2. Chronic diastolic dysfunction Stable No change required today  Return to see EP PA in a year  Thompson Grayer MD,  [facility] 09/29/2017 2:42 PM

## 2017-09-29 NOTE — Patient Instructions (Signed)
Medication Instructions:  Your physician recommends that you continue on your current medications as directed. Please refer to the Current Medication list given to you today.   Labwork: None ordered   Testing/Procedures: None ordered   Follow-Up: Your physician wants you to follow-up in: 12 months with Renee Ursuy, PA You will receive a reminder letter in the mail two months in advance. If you don't receive a letter, please call our office to schedule the follow-up appointment.       Any Other Special Instructions Will Be Listed Below (If Applicable).     If you need a refill on your cardiac medications before your next appointment, please call your pharmacy.   

## 2017-10-06 ENCOUNTER — Other Ambulatory Visit: Payer: Self-pay | Admitting: Internal Medicine

## 2018-01-13 ENCOUNTER — Other Ambulatory Visit: Payer: Self-pay | Admitting: Internal Medicine

## 2018-01-13 NOTE — Telephone Encounter (Signed)
Pt who is a 84 yr old female who saw Dr Rayann Heman on 09/29/17, weight was 96 Kg at her visit. SCr on 12/23/17 from [facility] was 1.8. CrCl is 35 mL/min. Referring back to telephone encounter on 06/18/17 per Dr Rayann Heman keep pt on lower dose of Pradaxa 75mg  BID. Thus, will refill.

## 2018-02-17 ENCOUNTER — Other Ambulatory Visit: Payer: Self-pay | Admitting: Internal Medicine

## 2018-03-10 ENCOUNTER — Other Ambulatory Visit: Payer: Self-pay | Admitting: Internal Medicine

## 2018-07-06 ENCOUNTER — Other Ambulatory Visit: Payer: Self-pay | Admitting: Internal Medicine

## 2018-07-06 DIAGNOSIS — R112 Nausea with vomiting, unspecified: Secondary | ICD-10-CM

## 2018-07-15 ENCOUNTER — Ambulatory Visit
Admission: RE | Admit: 2018-07-15 | Discharge: 2018-07-15 | Disposition: A | Discharge status: Home / Self Care | Payer: Medicare Other | Source: Ambulatory Visit | Attending: Internal Medicine | Admitting: Internal Medicine

## 2018-07-15 DIAGNOSIS — R112 Nausea with vomiting, unspecified: Secondary | ICD-10-CM

## 2019-10-15 ENCOUNTER — Ambulatory Visit: Payer: Medicare Other | Attending: Internal Medicine

## 2019-10-15 DIAGNOSIS — Z23 Encounter for immunization: Secondary | ICD-10-CM | POA: Insufficient documentation

## 2019-10-15 NOTE — Progress Notes (Signed)
   Covid-19 Vaccination Clinic  Name:  Sonya Kaufman    MRN: [id number] DOB: April 12, 1931  10/15/2019  Sonya Kaufman was observed post Covid-19 immunization for 30 minutes based on pre-vaccination screening without incidence. She was provided with Vaccine Information Sheet and instruction to access the V-Safe system.   Sonya Kaufman was instructed to call 911 with any severe reactions post vaccine: Marland Kitchen Difficulty breathing  . Swelling of your face and throat  . A fast heartbeat  . A bad rash all over your body  . Dizziness and weakness    Immunizations Administered    Name Date Dose VIS Date Route   Pfizer COVID-19 Vaccine 10/15/2019 12:50 PM 0.3 mL 09/10/2019 Intramuscular   Manufacturer: [place]   Lot: [id number]   [place]: [address]-9

## 2019-11-05 ENCOUNTER — Ambulatory Visit: Payer: Medicare Other | Attending: Internal Medicine

## 2019-11-05 DIAGNOSIS — Z23 Encounter for immunization: Secondary | ICD-10-CM | POA: Insufficient documentation

## 2019-11-05 NOTE — Progress Notes (Signed)
   Covid-19 Vaccination Clinic  Name:  Sonya Kaufman    MRN: [id number] DOB: [birth year]  11/05/2019  [name] was observed post Covid-19 immunization for 15 minutes without incidence. She was provided with Vaccine Information Sheet and instruction to access the V-Safe system.   [name] was instructed to call 911 with any severe reactions post vaccine: Marland Kitchen Difficulty breathing  . Swelling of your face and throat  . A fast heartbeat  . A bad rash all over your body  . Dizziness and weakness    Immunizations Administered    Name Date Dose VIS Date Route   Pfizer COVID-19 Vaccine 11/05/2019  1:30 PM 0.3 mL 09/10/2019 Intramuscular   Manufacturer: [place]   Lot: [id number]   [place]: [address]-9

## 2019-12-16 ENCOUNTER — Emergency Department (HOSPITAL_COMMUNITY): Payer: Medicare Other

## 2019-12-16 ENCOUNTER — Inpatient Hospital Stay (HOSPITAL_COMMUNITY)
Admission: EM | Admit: 2019-12-16 | Discharge: 2019-12-19 | DRG: 291 | Disposition: A | Discharge status: Home Health | Payer: Medicare Other | Attending: Internal Medicine | Admitting: Internal Medicine

## 2019-12-16 ENCOUNTER — Encounter (HOSPITAL_COMMUNITY): Payer: Self-pay

## 2019-12-16 DIAGNOSIS — J440 Chronic obstructive pulmonary disease with acute lower respiratory infection: Secondary | ICD-10-CM | POA: Diagnosis present

## 2019-12-16 DIAGNOSIS — R627 Adult failure to thrive: Secondary | ICD-10-CM | POA: Diagnosis present

## 2019-12-16 DIAGNOSIS — Z79890 Hormone replacement therapy (postmenopausal): Secondary | ICD-10-CM

## 2019-12-16 DIAGNOSIS — E039 Hypothyroidism, unspecified: Secondary | ICD-10-CM | POA: Diagnosis present

## 2019-12-16 DIAGNOSIS — F329 Major depressive disorder, single episode, unspecified: Secondary | ICD-10-CM | POA: Diagnosis present

## 2019-12-16 DIAGNOSIS — N184 Chronic kidney disease, stage 4 (severe): Secondary | ICD-10-CM | POA: Diagnosis present

## 2019-12-16 DIAGNOSIS — I4891 Unspecified atrial fibrillation: Secondary | ICD-10-CM | POA: Diagnosis present

## 2019-12-16 DIAGNOSIS — E785 Hyperlipidemia, unspecified: Secondary | ICD-10-CM | POA: Diagnosis present

## 2019-12-16 DIAGNOSIS — Z8261 Family history of arthritis: Secondary | ICD-10-CM

## 2019-12-16 DIAGNOSIS — Z20822 Contact with and (suspected) exposure to covid-19: Secondary | ICD-10-CM | POA: Diagnosis present

## 2019-12-16 DIAGNOSIS — Z66 Do not resuscitate: Secondary | ICD-10-CM | POA: Diagnosis present

## 2019-12-16 DIAGNOSIS — N189 Chronic kidney disease, unspecified: Secondary | ICD-10-CM

## 2019-12-16 DIAGNOSIS — Z8249 Family history of ischemic heart disease and other diseases of the circulatory system: Secondary | ICD-10-CM

## 2019-12-16 DIAGNOSIS — I5032 Chronic diastolic (congestive) heart failure: Secondary | ICD-10-CM

## 2019-12-16 DIAGNOSIS — J189 Pneumonia, unspecified organism: Secondary | ICD-10-CM | POA: Diagnosis present

## 2019-12-16 DIAGNOSIS — I5033 Acute on chronic diastolic (congestive) heart failure: Secondary | ICD-10-CM | POA: Diagnosis not present

## 2019-12-16 DIAGNOSIS — N289 Disorder of kidney and ureter, unspecified: Secondary | ICD-10-CM

## 2019-12-16 DIAGNOSIS — M8580 Other specified disorders of bone density and structure, unspecified site: Secondary | ICD-10-CM | POA: Diagnosis present

## 2019-12-16 DIAGNOSIS — M1990 Unspecified osteoarthritis, unspecified site: Secondary | ICD-10-CM | POA: Diagnosis present

## 2019-12-16 DIAGNOSIS — Z9981 Dependence on supplemental oxygen: Secondary | ICD-10-CM

## 2019-12-16 DIAGNOSIS — K5730 Diverticulosis of large intestine without perforation or abscess without bleeding: Secondary | ICD-10-CM | POA: Diagnosis present

## 2019-12-16 DIAGNOSIS — I4819 Other persistent atrial fibrillation: Secondary | ICD-10-CM | POA: Diagnosis present

## 2019-12-16 DIAGNOSIS — N390 Urinary tract infection, site not specified: Secondary | ICD-10-CM | POA: Diagnosis present

## 2019-12-16 DIAGNOSIS — F32A Depression, unspecified: Secondary | ICD-10-CM | POA: Diagnosis present

## 2019-12-16 DIAGNOSIS — G9340 Encephalopathy, unspecified: Secondary | ICD-10-CM | POA: Diagnosis present

## 2019-12-16 DIAGNOSIS — J9621 Acute and chronic respiratory failure with hypoxia: Secondary | ICD-10-CM | POA: Diagnosis present

## 2019-12-16 LAB — CBC WITH DIFFERENTIAL/PLATELET
Abs Immature Granulocytes: 0.02 10*3/uL (ref 0.00–0.07)
Basophils Absolute: 0 10*3/uL (ref 0.0–0.1)
Basophils Relative: 0 %
Eosinophils Absolute: 0.1 10*3/uL (ref 0.0–0.5)
Eosinophils Relative: 1 %
HCT: 48.2 % — ABNORMAL HIGH (ref 36.0–46.0)
Hemoglobin: 14.6 g/dL (ref 12.0–15.0)
Immature Granulocytes: 0 %
Lymphocytes Relative: 22 %
Lymphs Abs: 1.5 10*3/uL (ref 0.7–4.0)
MCH: 30.6 pg (ref 26.0–34.0)
MCHC: 30.3 g/dL (ref 30.0–36.0)
MCV: 101 fL — ABNORMAL HIGH (ref 80.0–100.0)
Monocytes Absolute: 0.7 10*3/uL (ref 0.1–1.0)
Monocytes Relative: 10 %
Neutro Abs: 4.8 10*3/uL (ref 1.7–7.7)
Neutrophils Relative %: 67 %
Platelets: 215 10*3/uL (ref 150–400)
RBC: 4.77 MIL/uL (ref 3.87–5.11)
RDW: 13.7 % (ref 11.5–15.5)
WBC: 7.1 10*3/uL (ref 4.0–10.5)
nRBC: 0 % (ref 0.0–0.2)

## 2019-12-16 LAB — URINALYSIS, ROUTINE W REFLEX MICROSCOPIC
Bilirubin Urine: NEGATIVE
Glucose, UA: NEGATIVE mg/dL
Hgb urine dipstick: NEGATIVE
Ketones, ur: NEGATIVE mg/dL
Leukocytes,Ua: NEGATIVE
Nitrite: NEGATIVE
Protein, ur: NEGATIVE mg/dL
Specific Gravity, Urine: 1.019 (ref 1.005–1.030)
pH: 7 (ref 5.0–8.0)

## 2019-12-16 LAB — COMPREHENSIVE METABOLIC PANEL
ALT: 15 U/L (ref 0–44)
AST: 19 U/L (ref 15–41)
Albumin: 3.6 g/dL (ref 3.5–5.0)
Alkaline Phosphatase: 81 U/L (ref 38–126)
Anion gap: 9 (ref 5–15)
BUN: 26 mg/dL — ABNORMAL HIGH (ref 8–23)
CO2: 29 mmol/L (ref 22–32)
Calcium: 9.6 mg/dL (ref 8.9–10.3)
Chloride: 100 mmol/L (ref 98–111)
Creatinine, Ser: 1.71 mg/dL — ABNORMAL HIGH (ref 0.44–1.00)
GFR calc Af Amer: 31 mL/min — ABNORMAL LOW (ref >60)
GFR calc non Af Amer: 27 mL/min — ABNORMAL LOW (ref >60)
Glucose, Bld: 102 mg/dL — ABNORMAL HIGH (ref 70–99)
Potassium: 4.8 mmol/L (ref 3.5–5.1)
Sodium: 138 mmol/L (ref 135–145)
Total Bilirubin: 0.9 mg/dL (ref 0.3–1.2)
Total Protein: 6.7 g/dL (ref 6.5–8.1)

## 2019-12-16 LAB — HEMOGLOBIN A1C
Hgb A1c MFr Bld: 5.9 % — ABNORMAL HIGH (ref 4.8–5.6)
Mean Plasma Glucose: 122.63 mg/dL

## 2019-12-16 LAB — POC SARS CORONAVIRUS 2 AG -  ED: SARS Coronavirus 2 Ag: NEGATIVE

## 2019-12-16 LAB — LACTIC ACID, PLASMA: Lactic Acid, Venous: 1.8 mmol/L (ref 0.5–1.9)

## 2019-12-16 LAB — TROPONIN I (HIGH SENSITIVITY)
Troponin I (High Sensitivity): 20 ng/L — ABNORMAL HIGH (ref <18)
Troponin I (High Sensitivity): 19 ng/L — ABNORMAL HIGH (ref <18)

## 2019-12-16 LAB — TSH: TSH: 1.664 u[IU]/mL (ref 0.350–4.500)

## 2019-12-16 LAB — LITHIUM LEVEL: Lithium Lvl: 0.61 mmol/L (ref 0.60–1.20)

## 2019-12-16 LAB — BRAIN NATRIURETIC PEPTIDE: B Natriuretic Peptide: 504.2 pg/mL — ABNORMAL HIGH (ref 0.0–100.0)

## 2019-12-16 MED ORDER — SENNA 8.6 MG PO TABS
1.0000 | ORAL_TABLET | Freq: Two times a day (BID) | ORAL | Status: DC
  Administered 2019-12-16 – 2019-12-19 (×6): 8.6 mg via ORAL
  Filled 2019-12-16 (×6): qty 1

## 2019-12-16 MED ORDER — POLYETHYLENE GLYCOL 3350 17 G PO PACK: 17.0000 g | PACK | Freq: Every day | ORAL | Status: DC | PRN

## 2019-12-16 MED ORDER — ALBUTEROL SULFATE (2.5 MG/3ML) 0.083% IN NEBU: 2.5000 mg | INHALATION_SOLUTION | RESPIRATORY_TRACT | Status: DC | PRN

## 2019-12-16 MED ORDER — ACETAMINOPHEN 650 MG RE SUPP: 650.0000 mg | Freq: Four times a day (QID) | RECTAL | Status: DC | PRN

## 2019-12-16 MED ORDER — SIMVASTATIN 20 MG PO TABS
20.0000 mg | ORAL_TABLET | Freq: Every day | ORAL | Status: DC
  Administered 2019-12-16 – 2019-12-18 (×3): 20 mg via ORAL
  Filled 2019-12-16 (×3): qty 1

## 2019-12-16 MED ORDER — SODIUM CHLORIDE 0.9 % IV BOLUS
500.0000 mL | Freq: Once | INTRAVENOUS | Status: AC
  Administered 2019-12-16: 13:00:00 500 mL via INTRAVENOUS

## 2019-12-16 MED ORDER — BUPROPION HCL ER (XL) 300 MG PO TB24
300.0000 mg | ORAL_TABLET | Freq: Every day | ORAL | Status: DC
  Administered 2019-12-17 – 2019-12-19 (×3): 300 mg via ORAL
  Filled 2019-12-16 (×3): qty 1

## 2019-12-16 MED ORDER — METOPROLOL SUCCINATE ER 25 MG PO TB24
25.0000 mg | ORAL_TABLET | Freq: Two times a day (BID) | ORAL | Status: DC
  Administered 2019-12-16 – 2019-12-19 (×6): 25 mg via ORAL
  Filled 2019-12-16 (×6): qty 1

## 2019-12-16 MED ORDER — LEVOTHYROXINE SODIUM 112 MCG PO TABS
112.0000 ug | ORAL_TABLET | Freq: Every day | ORAL | Status: DC
  Administered 2019-12-17 – 2019-12-19 (×3): 112 ug via ORAL
  Filled 2019-12-16 (×3): qty 1

## 2019-12-16 MED ORDER — ACETAMINOPHEN 325 MG PO TABS
650.0000 mg | ORAL_TABLET | Freq: Four times a day (QID) | ORAL | Status: DC | PRN
  Administered 2019-12-18: 650 mg via ORAL
  Filled 2019-12-16: qty 2

## 2019-12-16 MED ORDER — LITHIUM CARBONATE ER 300 MG PO TBCR
300.0000 mg | EXTENDED_RELEASE_TABLET | Freq: Every day | ORAL | Status: DC
  Administered 2019-12-16 – 2019-12-18 (×3): 300 mg via ORAL
  Filled 2019-12-16 (×4): qty 1

## 2019-12-16 MED ORDER — ESCITALOPRAM OXALATE 20 MG PO TABS
40.0000 mg | ORAL_TABLET | Freq: Every day | ORAL | Status: DC
  Administered 2019-12-17 – 2019-12-19 (×3): 40 mg via ORAL
  Filled 2019-12-16 (×3): qty 2

## 2019-12-16 MED ORDER — DABIGATRAN ETEXILATE MESYLATE 75 MG PO CAPS
75.0000 mg | ORAL_CAPSULE | Freq: Two times a day (BID) | ORAL | Status: DC
  Administered 2019-12-16 – 2019-12-19 (×6): 75 mg via ORAL
  Filled 2019-12-16 (×7): qty 1

## 2019-12-16 MED ORDER — SODIUM CHLORIDE 0.9 % IV SOLN
1.0000 g | Freq: Once | INTRAVENOUS | Status: AC
  Administered 2019-12-16: 13:00:00 1 g via INTRAVENOUS
  Filled 2019-12-16: qty 10

## 2019-12-16 MED ORDER — FUROSEMIDE 10 MG/ML IJ SOLN
40.0000 mg | Freq: Every day | INTRAMUSCULAR | Status: DC
  Administered 2019-12-16 – 2019-12-17 (×2): 40 mg via INTRAVENOUS
  Filled 2019-12-16 (×2): qty 4

## 2019-12-16 MED ORDER — POTASSIUM CHLORIDE CRYS ER 20 MEQ PO TBCR
20.0000 meq | EXTENDED_RELEASE_TABLET | Freq: Every day | ORAL | Status: DC
  Administered 2019-12-17 – 2019-12-19 (×3): 20 meq via ORAL
  Filled 2019-12-16 (×3): qty 1

## 2019-12-16 MED ORDER — OLANZAPINE 5 MG PO TABS
2.5000 mg | ORAL_TABLET | Freq: Every day | ORAL | Status: DC
  Administered 2019-12-16: 2.5 mg via ORAL
  Filled 2019-12-16: qty 1

## 2019-12-16 NOTE — ED Notes (Signed)
Sonya Kaufman ([place]) [phone].

## 2019-12-16 NOTE — Progress Notes (Signed)
TOC CM received message from [facility], Ronalee Belts. Pt active with HHRN. TOC CM/CSW will continue to follow for any dc needs. [place], [place] ED TOC CM [phone]

## 2019-12-16 NOTE — ED Notes (Signed)
Daughter at bedside.

## 2019-12-16 NOTE — H&P (Signed)
History and Physical        Hospital Admission Note Date: 12/16/2019  Patient name: Sonya Kaufman Medical record number: [id number] Date of birth: November 11, 1930 Age: 86 y.o. Gender: female  PCP: Shon Baton, MD  Patient coming from: Home Lives with: Son with disability and patient's remaining 2 daughters and son rotate throughout the week providing 24/7 care At baseline, ambulates: With rolling walker  Chief Complaint    Failure to thrive   HPI:   This is an 86 year old female with past medical history of atrial fibrillation on Pradaxa and Toprol, CKD 3b/4, heart failure (no echo on file), depression on Wellbutrin, Lexapro, lithium and Zyprexa nightly who presented to [facility] on 3/18 for failure to thrive type symptoms x1 to 2 weeks.  According to patient's daughter, who is an Therapist, sports at this hospital, roughly 1 month ago or so patient was diagnosed and treated for pneumonia and since that time she has been requiring 2 L/min O2 via nasal cannula and has not required O2 prior to this time.  She has since been having persistent and now increasing shortness of breath.  Additionally had low back pain last week and was prescribed Bactrim for empiric UTI coverage however after roughly 3 days of therapy began having failure to thrive, decreased appetite, increased shortness of breath and yesterday began having tremors per daughter and so Bactrim was discontinued as it was thought it may be due to side effect of Bactrim.    Today, daughter states patient is still not at baseline and has not been very ambulatory lately.  Patient states she just does not feel right but unable to specify.  Denies chest pain, fever, chills, night sweats, nausea, vomiting, diarrhea, leg swelling or increased with known weight gain.  No creased salt intake.  Compliant with medications as family manages meds. Of note, patient has  received both Pfizer vaccine doses with last dose in February.  Notable ED labs: Glucose 102, BUN 26, creatinine 1.71 (baseline 1.6-1.8), BNP 504, HS troponin 20-> 19, UA negative, otherwise labs unremarkable ED treatments: Ceftriaxone x1, 500 cc NS bolus ED imaging: CXR unremarkable, CT head without acute abnormality, CT renal stone study (for low back pain)-unremarkable ED vitals:  Vitals:   12/16/19 1600 12/16/19 1630  BP: 130/80 140/68  Pulse: 87 91  Resp: 19 (!) 31  Temp:    SpO2: 95% 94%     Review of Systems:  Review of Systems  Constitutional: Negative for chills and fever.  HENT: Negative.   Eyes: Negative.  Negative for blurred vision.  Respiratory: Positive for shortness of breath and wheezing. Negative for cough.   Cardiovascular: Negative for chest pain and palpitations.  Gastrointestinal: Negative for nausea and vomiting.  Genitourinary: Negative for dysuria and urgency.  Musculoskeletal: Positive for back pain. Negative for falls and myalgias.  Neurological: Negative for dizziness and headaches.  Psychiatric/Behavioral: Negative.   All other systems reviewed and are negative.   Medical/Social/Family History   Past Medical History: Past Medical History:  Diagnosis Date  . Atrial flutter ([place])   . Benign neoplasm of colon   . Depressive disorder, not elsewhere classified   . Diastolic CHF, chronic ([place])   . Disorder of bone  and cartilage, unspecified   . Diverticulosis of colon (without mention of hemorrhage)   . Osteoarthrosis, unspecified whether generalized or localized, unspecified site   . Other and unspecified hyperlipidemia    EF 60-65% by echo 11/2011  . Overweight(278.02)   . Persistent atrial fibrillation ([place])   . Renal insufficiency   . Tachycardia-bradycardia ([place])   . Unspecified hypothyroidism   . Venous insufficiency     Past Surgical History:  Procedure Laterality Date  . TONSILLECTOMY      Medications: Prior to Admission  medications   Medication Sig Start Date End Date Taking? Authorizing Provider  buPROPion (WELLBUTRIN XL) 300 MG 24 hr tablet Take 300 mg by mouth daily.      [provider]  escitalopram (LEXAPRO) 20 MG tablet Take 40 mg by mouth daily.     [provider]  furosemide (LASIX) 20 MG tablet TAKE 2 TABLETS BY MOUTH IN THE MORNING AND 1 TAB IN THE EVENING. MAY TAKE 1 EXTRA TABLET DAILY AS NEEDED FOR WEIGHT GAIN 02/17/18   Allred, Jeneen Rinks, MD  lithium carbonate (LITHOBID) 300 MG CR tablet Take 300 mg by mouth daily.    [provider]  metoprolol succinate (TOPROL-XL) 25 MG 24 hr tablet TAKE ONE TABLET BY MOUTH TWICE DAILY 10/07/17   Allred, Jeneen Rinks, MD  OLANZapine (ZYPREXA) 2.5 MG tablet Take 2.5 mg by mouth daily. 1/2 TAB PO QD 11/21/11   Dunn, Lisbeth Renshaw N, PA-C  potassium chloride SA (K-DUR,KLOR-CON) 20 MEQ tablet TAKE ONE TABLET BY MOUTH ONCE DAILY 03/10/18   Allred, Jeneen Rinks, MD  PRADAXA 75 MG CAPS capsule TAKE 1 CAPSULE BY MOUTH TWICE DAILY 01/13/18   Allred, Jeneen Rinks, MD  simvastatin (ZOCOR) 20 MG tablet Take 20 mg by mouth daily.    [provider]  SYNTHROID 112 MCG tablet Take 1 tablet by mouth daily before breakfast. 03/23/15   [provider]    Allergies:  No Known Allergies  Social History:  reports that she has never smoked. She has never used smokeless tobacco. She reports that she does not drink alcohol or use drugs.  Family History: Family History  Problem Relation Age of Onset  . Heart failure Mother   . Arthritis Mother   . Heart disease Father   . Heart disease Other      Objective   Physical Exam: Blood pressure 140/68, pulse 91, temperature 98.1 F (36.7 C), temperature source Oral, resp. rate (!) 31, SpO2 94 %.  Physical Exam Vitals and nursing note reviewed. Exam conducted with a chaperone present.  Constitutional:      Appearance: She is obese.  HENT:     Head: Normocephalic and atraumatic.  Eyes:     Conjunctiva/sclera:  Conjunctivae normal.  Cardiovascular:     Rate and Rhythm: Normal rate. Rhythm irregular.  Pulmonary:     Effort: Pulmonary effort is normal.     Comments: Mild increased work of breathing and conversational dyspnea Basilar rales noted End expiratory wheeze Abdominal:     General: Abdomen is flat.     Palpations: Abdomen is soft.  Musculoskeletal:        General: No swelling or tenderness.  Skin:    Coloration: Skin is not jaundiced or pale.  Neurological:     General: No focal deficit present.     Mental Status: She is alert.  Psychiatric:        Mood and Affect: Mood normal.        Behavior: Behavior normal.  LABS on Admission: I have personally reviewed all the labs and imaging below    Basic Metabolic Panel: Recent Labs  Lab 12/16/19 1158  NA 138  K 4.8  CL 100  CO2 29  GLUCOSE 102*  BUN 26*  CREATININE 1.71*  CALCIUM 9.6   Liver Function Tests: Recent Labs  Lab 12/16/19 1158  AST 19  ALT 15  ALKPHOS 81  BILITOT 0.9  PROT 6.7  ALBUMIN 3.6   No results for input(s): LIPASE, AMYLASE in the last 168 hours. No results for input(s): AMMONIA in the last 168 hours. CBC: Recent Labs  Lab 12/16/19 1158  WBC 7.1  NEUTROABS 4.8  HGB 14.6  HCT 48.2*  MCV 101.0*  PLT 215   Cardiac Enzymes: No results for input(s): CKTOTAL, CKMB, CKMBINDEX, TROPONINI in the last 168 hours. BNP: Invalid input(s): POCBNP CBG: No results for input(s): GLUCAP in the last 168 hours.  Radiological Exams on Admission:  CT Head Wo Contrast  Result Date: 12/16/2019 CLINICAL DATA:  Dizziness. EXAM: CT HEAD WITHOUT CONTRAST TECHNIQUE: Contiguous axial images were obtained from the base of the skull through the vertex without intravenous contrast. COMPARISON:  December 03, 2015. FINDINGS: Brain: Mild chronic ischemic white matter disease is noted. No mass effect or midline shift is noted. Ventricular size is within normal limits. There is no evidence of mass lesion, hemorrhage or  acute infarction. Vascular: No hyperdense vessel or unexpected calcification. Skull: Normal. Negative for fracture or focal lesion. Sinuses/Orbits: No acute finding. Other: None. IMPRESSION: Mild chronic ischemic white matter disease. No acute intracranial abnormality seen. Electronically Signed   By: Marijo Conception M.D.   On: 12/16/2019 15:14   DG Chest Portable 1 View  Result Date: 12/16/2019 CLINICAL DATA:  Shortness of breath. Diagnosed with UTI 4 days ago and given Bactrim. Stop taking antibiotics 2 days ago. EXAM: PORTABLE CHEST 1 VIEW COMPARISON:  04/02/2014 FINDINGS: Patient slightly rotated to the right. Lungs are adequately inflated without focal airspace consolidation or effusion. No pneumothorax. Borderline stable cardiomegaly. Remainder the exam is unchanged. IMPRESSION: No acute cardiopulmonary disease. Electronically Signed   By: Marin Olp M.D.   On: 12/16/2019 12:22   CT Renal Stone Study  Result Date: 12/16/2019 CLINICAL DATA:  Flank pain.  Recent UTI EXAM: CT ABDOMEN AND PELVIS WITHOUT CONTRAST TECHNIQUE: Multidetector CT imaging of the abdomen and pelvis was performed following the standard protocol without IV contrast. COMPARISON:  None. FINDINGS: Lower chest: Cardiomegaly.  No acute abnormality. Hepatobiliary: Small layering gallstones within the gallbladder. No focal hepatic abnormality. No biliary ductal dilatation. Pancreas: No focal abnormality or ductal dilatation. Spleen: No focal abnormality.  Normal size. Adrenals/Urinary Tract: Low-density nodule within the left adrenal gland measures 2.5 cm most compatible with adenoma. Small exophytic lesion off the upper pole of the left kidney measures 2 cm, likely cyst. No renal or ureteral stones. No hydronephrosis. Urinary bladder unremarkable. Stomach/Bowel: Few scattered right colonic diverticula. No active diverticulitis. Normal appendix. Stomach and small bowel decompressed, unremarkable. Vascular/Lymphatic: Aortic  atherosclerosis. No enlarged abdominal or pelvic lymph nodes. Reproductive: Uterus and adnexa unremarkable.  No mass. Other: No free fluid or free air. Small umbilical hernia containing fat. Musculoskeletal: No acute bony abnormality. IMPRESSION: No renal or ureteral stones.  No hydronephrosis. Cholelithiasis. Aortic atherosclerosis. Umbilical hernia containing fat. Electronically Signed   By: Rolm Baptise M.D.   On: 12/16/2019 15:14      EKG: Independently reviewed.  Rate controlled A. fib with Q waves in V1.  No other pathologic ST changes   A & P   Active Problems:   Failure to thrive in adult   1. Failure to thrive, Concern for secondary to underlying heart failure exacerbation VS.  Hypothyroidism VS.  Depression VS.  Other a. UA negative and has received at least 4 days antibiotic therapy for empiric UTI coverage, hold off antibiotics for now b. TTE c. TSH d. Lasix 40 mg IV e. PT f. Orthostatic vitals g. Telemetry h. Check lithium level (distant history of lithium toxicity)  2. Acute on chronic hypoxic respiratory failure, concern for heart failure exacerbation a. Has been on 2 L/min O2 for the past 1 to 2 months but previously did not require O2.  Apparently was recently treated for pneumonia per daughter who is an Therapist, sports in oncology center has required O2 since that time, now requiring 3 L/min and with some wheezing (may be cardiac wheeze?) b. BNP 504 c. No recent echo on file unknown baseline EF d. No clear etiology for her heart failure exacerbation as she is compliant with diet and medications e. Daily weights f. Lasix 40 mg IV g. Echo h. Albuterol neb as needed 3. Depression a. Continue Lexapro, Wellbutrin, Zyprexa nightly and lithium b. Check TSH 4. Rate controlled atrial fibrillation a. Continue Toprol XL b. Continue Pradaxa 5. Hypothyroidism a. Continue Synthroid b. Check TSH 6. CKD 3b/4 at baseline   DVT prophylaxis: Pradaxa   Code Status: Prior  Diet: Heart  healthy Family Communication: Admission, patients condition and plan of care including tests being ordered have been discussed with the patient who indicates understanding and agrees with the plan and Code Status. Patient's daughter was updated  Disposition Plan: The appropriate patient status for this patient is OBSERVATION. Observation status is judged to be reasonable and necessary in order to provide the required intensity of service to ensure the patient's safety. The patient's presenting symptoms, physical exam findings, and initial radiographic and laboratory data in the context of their medical condition is felt to place them at decreased risk for further clinical deterioration. Furthermore, it is anticipated that the patient will be medically stable for discharge from the hospital within 2 midnights of admission. The following factors support the patient status of observation.   " The patient's presenting symptoms include failure to thrive, dyspnea. " The physical exam findings include increased work of breathing, rales. " The initial radiographic and laboratory data are BNP 504.      Consultants  . None  Procedures  . None  Time Spent on Admission: 65 minutes    Harold Hedge, DO [facility] Pager [phone] 12/16/2019, 4:54 PM

## 2019-12-16 NOTE — ED Notes (Signed)
Sonya Kaufman [phone] daughter, works at [employer] if you have any questions. She can walk over if you need her.

## 2019-12-16 NOTE — ED Provider Notes (Signed)
[facility] DEPT Provider Note   CSN: [id number] Arrival date & time: 12/16/19  1139     History No chief complaint on file.   Sonya Kaufman is a 86 y.o. female.  Patient sent from home with generalized weakness and concern for sepsis.  EMS reports she was diagnosed with UTI 5 days ago by her PCP and placed on Bactrim for 2 days.  This was stopped 2 days later due to "trembling".  Today EMS was called to take patient to the hospital for IV antibiotics due to concern for sepsis.  There is been no fever at home.  There has been increased fatigue and confusion worse than baseline.  No vomiting.  No chest pain or shortness of breath.  Patient does wear home oxygen for history of CHF and COPD without recent increase. Has been on 2L since about 6 weeks ago when she had "pneumonia" but was no on O2 prior to this.  No complaints Pradaxa for history of atrial fibrillation.  Patient states she has back pain from the ambulance ride but denies any falls or trauma.  Denies any pain with urination or blood in the urine.  States last bowel movement was 1 week ago.  No abdominal pain or vomiting.  No chest pain or shortness of breath.  States her breathing is at baseline.  The history is provided by the patient and the EMS personnel. The history is limited by the condition of the patient.       Past Medical History:  Diagnosis Date  . Atrial flutter ([place])   . Benign neoplasm of colon   . Depressive disorder, not elsewhere classified   . Diastolic CHF, chronic ([place])   . Disorder of bone and cartilage, unspecified   . Diverticulosis of colon (without mention of hemorrhage)   . Osteoarthrosis, unspecified whether generalized or localized, unspecified site   . Other and unspecified hyperlipidemia    EF 60-65% by echo 11/2011  . Overweight(278.02)   . Persistent atrial fibrillation ([place])   . Renal insufficiency   . Tachycardia-bradycardia ([place])   . Unspecified  hypothyroidism   . Venous insufficiency     Patient Active Problem List   Diagnosis Date Noted  . Acute on chronic diastolic ACC/AHA stage C congestive heart failure ([place]) 03/27/2015  . Chronic diastolic CHF (congestive heart failure), NYHA class 2 ([place]) 04/02/2014  . Permanent atrial fibrillation ([place]) 04/02/2014  . Chest pain with moderate risk for cardiac etiology 04/02/2014  . Precordial pain 04/02/2014  . Respiratory distress, acute 11/04/2013  . COPD exacerbation ([place]) 11/03/2013  . COPD with acute exacerbation ([place]) 11/03/2013  . Renal insufficiency 11/29/2011  . Bradycardia 11/29/2011  . Respiratory failure with hypoxia ([place]) 11/21/2011  . Polydipsia 07/29/2011  . Atrial flutter ([place]) 01/28/2011  . Atrial flutter --RVR 01/11/2011  . Chronic diastolic heart failure ([place]) 12/13/2010  . Acute on chronic renal insufficiency 12/13/2010  . COLONIC POLYPS 08/03/2010  . Atrial fibrillation ([place]) 08/03/2010  . DIVERTICULOSIS OF COLON 08/03/2010  . CONSTIPATION 06/01/2009  . OVERWEIGHT 02/02/2008  . CEREBROVASCULAR DISEASE 02/02/2008  . VENOUS INSUFFICIENCY 02/02/2008  . OSTEOPENIA 02/02/2008  . HYPOTHYROIDISM 12/31/2007  . HYPERLIPIDEMIA 12/31/2007  . DEPRESSION 12/31/2007  . ASTHMATIC BRONCHITIS, ACUTE 12/31/2007  . OSTEOARTHRITIS 12/31/2007    Past Surgical History:  Procedure Laterality Date  . TONSILLECTOMY       OB History   No obstetric history on file.     Family History  Problem Relation Age  of Onset  . Heart failure Mother   . Arthritis Mother   . Heart disease Father   . Heart disease Other     Social History   Tobacco Use  . Smoking status: Never Smoker  . Smokeless tobacco: Never Used  Substance Use Topics  . Alcohol use: No  . Drug use: No    Home Medications Prior to Admission medications   Medication Sig Start Date End Date Taking? Authorizing Provider  buPROPion (WELLBUTRIN XL) 300 MG 24 hr tablet Take 300 mg by mouth daily.       [provider]  escitalopram (LEXAPRO) 20 MG tablet Take 40 mg by mouth daily.     [provider]  furosemide (LASIX) 20 MG tablet TAKE 2 TABLETS BY MOUTH IN THE MORNING AND 1 TAB IN THE EVENING. MAY TAKE 1 EXTRA TABLET DAILY AS NEEDED FOR WEIGHT GAIN 02/17/18   Allred, Jeneen Rinks, MD  lithium carbonate (LITHOBID) 300 MG CR tablet Take 300 mg by mouth daily.    [provider]  metoprolol succinate (TOPROL-XL) 25 MG 24 hr tablet TAKE ONE TABLET BY MOUTH TWICE DAILY 10/07/17   Allred, Jeneen Rinks, MD  OLANZapine (ZYPREXA) 2.5 MG tablet Take 2.5 mg by mouth daily. 1/2 TAB PO QD 11/21/11   Dunn, Lisbeth Renshaw N, PA-C  potassium chloride SA (K-DUR,KLOR-CON) 20 MEQ tablet TAKE ONE TABLET BY MOUTH ONCE DAILY 03/10/18   Allred, Jeneen Rinks, MD  PRADAXA 75 MG CAPS capsule TAKE 1 CAPSULE BY MOUTH TWICE DAILY 01/13/18   Allred, Jeneen Rinks, MD  simvastatin (ZOCOR) 20 MG tablet Take 20 mg by mouth daily.    [provider]  SYNTHROID 112 MCG tablet Take 1 tablet by mouth daily before breakfast. 03/23/15   [provider]    Allergies    Patient has no known allergies.  Review of Systems   Review of Systems  Constitutional: Positive for activity change, appetite change and fatigue. Negative for fever.  HENT: Negative for congestion and rhinorrhea.   Eyes: Negative for visual disturbance.  Respiratory: Negative for cough, chest tightness and shortness of breath.   Cardiovascular: Negative for chest pain and leg swelling.  Gastrointestinal: Positive for constipation. Negative for abdominal pain, nausea and vomiting.  Genitourinary: Negative for dysuria and hematuria.  Musculoskeletal: Positive for arthralgias and myalgias.  Skin: Negative for rash.  Neurological: Positive for weakness and light-headedness. Negative for facial asymmetry.   all other systems are negative except as noted in the HPI and PMH.    Physical Exam Updated Vital Signs BP 131/86 (BP Location: Right Arm)   Pulse 86    Temp 98.1 F (36.7 C) (Oral)   Resp (!) 24   SpO2 94%   Physical Exam Vitals and nursing note reviewed.  Constitutional:      General: She is not in acute distress.    Appearance: She is well-developed. She is obese.  HENT:     Head: Normocephalic and atraumatic.     Mouth/Throat:     Pharynx: No oropharyngeal exudate.  Eyes:     Conjunctiva/sclera: Conjunctivae normal.     Pupils: Pupils are equal, round, and reactive to light.  Neck:     Comments: No meningismus. Cardiovascular:     Rate and Rhythm: Normal rate. Rhythm irregular.     Heart sounds: Normal heart sounds. No murmur.  Pulmonary:     Effort: Pulmonary effort is normal. No respiratory distress.     Breath sounds: Normal breath sounds.  Abdominal:  Palpations: Abdomen is soft.     Tenderness: There is no abdominal tenderness. There is no guarding or rebound.     Comments: Reducible Umbilical hernia  Musculoskeletal:        General: Tenderness present. Normal range of motion.     Cervical back: Normal range of motion and neck supple.     Comments: Paraspinal lumbar tenderness bilaterally  Equal DP and PT pulses Able to raise legs off bed bilaterally  Skin:    General: Skin is warm.     Capillary Refill: Capillary refill takes less than 2 seconds.  Neurological:     General: No focal deficit present.     Mental Status: She is alert and oriented to person, place, and time. Mental status is at baseline.     Cranial Nerves: No cranial nerve deficit.     Motor: No abnormal muscle tone.     Coordination: Coordination normal.     Comments:  5/5 strength throughout. CN 2-12 intact.Equal grip strength.   Psychiatric:        Behavior: Behavior normal.     ED Results / Procedures / Treatments   Labs (all labs ordered are listed, but only abnormal results are displayed) Labs Reviewed  CBC WITH DIFFERENTIAL/PLATELET - Abnormal; Notable for the following components:      Result Value   HCT 48.2 (*)    MCV  101.0 (*)    All other components within normal limits  COMPREHENSIVE METABOLIC PANEL - Abnormal; Notable for the following components:   Glucose, Bld 102 (*)    BUN 26 (*)    Creatinine, Ser 1.71 (*)    GFR calc non Af Amer 27 (*)    GFR calc Af Amer 31 (*)    All other components within normal limits  BRAIN NATRIURETIC PEPTIDE - Abnormal; Notable for the following components:   B Natriuretic Peptide 504.2 (*)    All other components within normal limits  TROPONIN I (HIGH SENSITIVITY) - Abnormal; Notable for the following components:   Troponin I (High Sensitivity) 20 (*)    All other components within normal limits  TROPONIN I (HIGH SENSITIVITY) - Abnormal; Notable for the following components:   Troponin I (High Sensitivity) 19 (*)    All other components within normal limits  CULTURE, BLOOD (ROUTINE X 2)  CULTURE, BLOOD (ROUTINE X 2)  URINE CULTURE  SARS CORONAVIRUS 2 (TAT 6-24 HRS)  LACTIC ACID, PLASMA  URINALYSIS, ROUTINE W REFLEX MICROSCOPIC  POC SARS CORONAVIRUS 2 AG -  ED    EKG EKG Interpretation  Date/Time:  Thursday December 16 2019 12:09:22 EDT Ventricular Rate:  87 PR Interval:    QRS Duration: 88 QT Interval:  374 QTC Calculation: 450 R Axis:   59 Text Interpretation: Atrial fibrillation Nonspecific T abnormalities, lateral leads No significant change was found Confirmed by Ezequiel Essex [phone]) on 12/16/2019 12:23:06 PM   Radiology CT Head Wo Contrast  Result Date: 12/16/2019 CLINICAL DATA:  Dizziness. EXAM: CT HEAD WITHOUT CONTRAST TECHNIQUE: Contiguous axial images were obtained from the base of the skull through the vertex without intravenous contrast. COMPARISON:  December 03, 2015. FINDINGS: Brain: Mild chronic ischemic white matter disease is noted. No mass effect or midline shift is noted. Ventricular size is within normal limits. There is no evidence of mass lesion, hemorrhage or acute infarction. Vascular: No hyperdense vessel or unexpected  calcification. Skull: Normal. Negative for fracture or focal lesion. Sinuses/Orbits: No acute finding. Other: None. IMPRESSION: Mild chronic  ischemic white matter disease. No acute intracranial abnormality seen. Electronically Signed   By: Marijo Conception M.D.   On: 12/16/2019 15:14   DG Chest Portable 1 View  Result Date: 12/16/2019 CLINICAL DATA:  Shortness of breath. Diagnosed with UTI 4 days ago and given Bactrim. Stop taking antibiotics 2 days ago. EXAM: PORTABLE CHEST 1 VIEW COMPARISON:  04/02/2014 FINDINGS: Patient slightly rotated to the right. Lungs are adequately inflated without focal airspace consolidation or effusion. No pneumothorax. Borderline stable cardiomegaly. Remainder the exam is unchanged. IMPRESSION: No acute cardiopulmonary disease. Electronically Signed   By: Marin Olp M.D.   On: 12/16/2019 12:22   CT Renal Stone Study  Result Date: 12/16/2019 CLINICAL DATA:  Flank pain.  Recent UTI EXAM: CT ABDOMEN AND PELVIS WITHOUT CONTRAST TECHNIQUE: Multidetector CT imaging of the abdomen and pelvis was performed following the standard protocol without IV contrast. COMPARISON:  None. FINDINGS: Lower chest: Cardiomegaly.  No acute abnormality. Hepatobiliary: Small layering gallstones within the gallbladder. No focal hepatic abnormality. No biliary ductal dilatation. Pancreas: No focal abnormality or ductal dilatation. Spleen: No focal abnormality.  Normal size. Adrenals/Urinary Tract: Low-density nodule within the left adrenal gland measures 2.5 cm most compatible with adenoma. Small exophytic lesion off the upper pole of the left kidney measures 2 cm, likely cyst. No renal or ureteral stones. No hydronephrosis. Urinary bladder unremarkable. Stomach/Bowel: Few scattered right colonic diverticula. No active diverticulitis. Normal appendix. Stomach and small bowel decompressed, unremarkable. Vascular/Lymphatic: Aortic atherosclerosis. No enlarged abdominal or pelvic lymph nodes. Reproductive:  Uterus and adnexa unremarkable.  No mass. Other: No free fluid or free air. Small umbilical hernia containing fat. Musculoskeletal: No acute bony abnormality. IMPRESSION: No renal or ureteral stones.  No hydronephrosis. Cholelithiasis. Aortic atherosclerosis. Umbilical hernia containing fat. Electronically Signed   By: Rolm Baptise M.D.   On: 12/16/2019 15:14    Procedures Procedures (including critical care time)  Medications Ordered in ED Medications - No data to display  ED Course  I have reviewed the triage vital signs and the nursing notes.  Pertinent labs & imaging results that were available during my care of the patient were reviewed by me and considered in my medical decision making (see chart for details).    MDM Rules/Calculators/A&P                      Patient from home with generalized weakness and confusion with concern for UTI.  Treated with Bactrim for 2 days which was stopped.  Patient with mild tachypnea but denies any shortness of breath.  She has no hypoxia on home oxygen.  Chest x-ray is negative.  Labs are reassuring with stable creatinine.  Given gentle IV hydration. Urinalysis is negative for infection.  CT head was obtained and negative. CT renal stone study performed for back pain and negative as well. No AAA. No hydronephrosis.  Still not at baseline mentation per daughter. Concern for possible partially treated UTI, though UA negative today.  CXR negative but does have slight BNP elevation and troponin elevated. Denies CP or SOB. No echo in system. Has been on O2 for past several months. Doubt PE, on pradaxa. May have early CHF exacerbation.   D/w Dr. Neysa Bonito who will admit for observation.  Jaynie Hitch was evaluated in Emergency Department on 12/16/2019 for the symptoms described in the history of present illness. She was evaluated in the context of the global COVID-19 pandemic, which necessitated consideration that the patient might be at  risk for  infection with the SARS-CoV-2 virus that causes COVID-19. Institutional protocols and algorithms that pertain to the evaluation of patients at risk for COVID-19 are in a state of rapid change based on information released by regulatory bodies including the CDC and federal and state organizations. These policies and algorithms were followed during the patient's care in the ED.  Final Clinical Impression(s) / ED Diagnoses Final diagnoses:  Acute encephalopathy    Rx / DC Orders ED Discharge Orders    None       Chuck Caban, Annie Main, MD 12/16/19 1918

## 2019-12-16 NOTE — ED Notes (Signed)
Patient given meal tray.

## 2019-12-16 NOTE — ED Triage Notes (Signed)
Pt arriving by EMS from home  Dx with UTI on Sunday, given bactrim. Pt's daughter reports she stopped giving her the abx on Tuesday "because pt was trembling"   AOx4, daughter reports some confusion at baseline  BP 160/84 HR 80-110 T 98.4 CBG 164

## 2019-12-17 ENCOUNTER — Inpatient Hospital Stay (HOSPITAL_COMMUNITY): Payer: Medicare Other

## 2019-12-17 ENCOUNTER — Other Ambulatory Visit: Payer: Self-pay

## 2019-12-17 DIAGNOSIS — J189 Pneumonia, unspecified organism: Secondary | ICD-10-CM | POA: Diagnosis present

## 2019-12-17 DIAGNOSIS — I4819 Other persistent atrial fibrillation: Secondary | ICD-10-CM | POA: Diagnosis present

## 2019-12-17 DIAGNOSIS — M8580 Other specified disorders of bone density and structure, unspecified site: Secondary | ICD-10-CM | POA: Diagnosis present

## 2019-12-17 DIAGNOSIS — R0602 Shortness of breath: Secondary | ICD-10-CM | POA: Diagnosis not present

## 2019-12-17 DIAGNOSIS — N390 Urinary tract infection, site not specified: Secondary | ICD-10-CM | POA: Diagnosis present

## 2019-12-17 DIAGNOSIS — J440 Chronic obstructive pulmonary disease with acute lower respiratory infection: Secondary | ICD-10-CM | POA: Diagnosis present

## 2019-12-17 DIAGNOSIS — M1990 Unspecified osteoarthritis, unspecified site: Secondary | ICD-10-CM | POA: Diagnosis present

## 2019-12-17 DIAGNOSIS — Z9981 Dependence on supplemental oxygen: Secondary | ICD-10-CM | POA: Diagnosis not present

## 2019-12-17 DIAGNOSIS — G9340 Encephalopathy, unspecified: Secondary | ICD-10-CM | POA: Diagnosis present

## 2019-12-17 DIAGNOSIS — R627 Adult failure to thrive: Secondary | ICD-10-CM | POA: Diagnosis present

## 2019-12-17 DIAGNOSIS — Z8249 Family history of ischemic heart disease and other diseases of the circulatory system: Secondary | ICD-10-CM | POA: Diagnosis not present

## 2019-12-17 DIAGNOSIS — K5730 Diverticulosis of large intestine without perforation or abscess without bleeding: Secondary | ICD-10-CM | POA: Diagnosis present

## 2019-12-17 DIAGNOSIS — Z79890 Hormone replacement therapy (postmenopausal): Secondary | ICD-10-CM | POA: Diagnosis not present

## 2019-12-17 DIAGNOSIS — E785 Hyperlipidemia, unspecified: Secondary | ICD-10-CM | POA: Diagnosis present

## 2019-12-17 DIAGNOSIS — Z66 Do not resuscitate: Secondary | ICD-10-CM | POA: Diagnosis present

## 2019-12-17 DIAGNOSIS — I5033 Acute on chronic diastolic (congestive) heart failure: Secondary | ICD-10-CM | POA: Diagnosis present

## 2019-12-17 DIAGNOSIS — J9621 Acute and chronic respiratory failure with hypoxia: Secondary | ICD-10-CM | POA: Diagnosis present

## 2019-12-17 DIAGNOSIS — Z20822 Contact with and (suspected) exposure to covid-19: Secondary | ICD-10-CM | POA: Diagnosis present

## 2019-12-17 DIAGNOSIS — Z8261 Family history of arthritis: Secondary | ICD-10-CM | POA: Diagnosis not present

## 2019-12-17 DIAGNOSIS — E039 Hypothyroidism, unspecified: Secondary | ICD-10-CM | POA: Diagnosis present

## 2019-12-17 DIAGNOSIS — N184 Chronic kidney disease, stage 4 (severe): Secondary | ICD-10-CM | POA: Diagnosis present

## 2019-12-17 DIAGNOSIS — F329 Major depressive disorder, single episode, unspecified: Secondary | ICD-10-CM | POA: Diagnosis present

## 2019-12-17 LAB — URINE CULTURE: Culture: NO GROWTH

## 2019-12-17 LAB — BASIC METABOLIC PANEL
Anion gap: 11 (ref 5–15)
BUN: 23 mg/dL (ref 8–23)
CO2: 29 mmol/L (ref 22–32)
Calcium: 9.4 mg/dL (ref 8.9–10.3)
Chloride: 100 mmol/L (ref 98–111)
Creatinine, Ser: 1.67 mg/dL — ABNORMAL HIGH (ref 0.44–1.00)
GFR calc Af Amer: 32 mL/min — ABNORMAL LOW (ref >60)
GFR calc non Af Amer: 27 mL/min — ABNORMAL LOW (ref >60)
Glucose, Bld: 124 mg/dL — ABNORMAL HIGH (ref 70–99)
Potassium: 4.2 mmol/L (ref 3.5–5.1)
Sodium: 140 mmol/L (ref 135–145)

## 2019-12-17 LAB — CBC
HCT: 48.6 % — ABNORMAL HIGH (ref 36.0–46.0)
Hemoglobin: 14.7 g/dL (ref 12.0–15.0)
MCH: 30.6 pg (ref 26.0–34.0)
MCHC: 30.2 g/dL (ref 30.0–36.0)
MCV: 101 fL — ABNORMAL HIGH (ref 80.0–100.0)
Platelets: 186 10*3/uL (ref 150–400)
RBC: 4.81 MIL/uL (ref 3.87–5.11)
RDW: 13.9 % (ref 11.5–15.5)
WBC: 6.7 10*3/uL (ref 4.0–10.5)
nRBC: 0 % (ref 0.0–0.2)

## 2019-12-17 LAB — ECHOCARDIOGRAM COMPLETE
Height: 67 in
Weight: 3280 oz

## 2019-12-17 LAB — SARS CORONAVIRUS 2 (TAT 6-24 HRS): SARS Coronavirus 2: NEGATIVE

## 2019-12-17 LAB — MAGNESIUM: Magnesium: 2.1 mg/dL (ref 1.7–2.4)

## 2019-12-17 MED ORDER — PRO-STAT SUGAR FREE PO LIQD
30.0000 mL | Freq: Two times a day (BID) | ORAL | Status: DC
  Administered 2019-12-17 – 2019-12-19 (×4): 30 mL via ORAL
  Filled 2019-12-17 (×4): qty 30

## 2019-12-17 MED ORDER — PERFLUTREN LIPID MICROSPHERE
1.0000 mL | INTRAVENOUS | Status: AC | PRN
  Administered 2019-12-17: 11:00:00 2 mL via INTRAVENOUS
  Filled 2019-12-17: qty 10

## 2019-12-17 MED ORDER — LABETALOL HCL 5 MG/ML IV SOLN
10.0000 mg | INTRAVENOUS | Status: DC | PRN
  Administered 2019-12-17: 07:00:00 10 mg via INTRAVENOUS
  Filled 2019-12-17: qty 4

## 2019-12-17 MED ORDER — FUROSEMIDE 10 MG/ML IJ SOLN
40.0000 mg | Freq: Two times a day (BID) | INTRAMUSCULAR | Status: DC
  Administered 2019-12-17 – 2019-12-18 (×2): 40 mg via INTRAVENOUS
  Filled 2019-12-17 (×2): qty 4

## 2019-12-17 MED ORDER — ADULT MULTIVITAMIN W/MINERALS CH
1.0000 | ORAL_TABLET | Freq: Every day | ORAL | Status: DC
  Administered 2019-12-17 – 2019-12-19 (×3): 1 via ORAL
  Filled 2019-12-17 (×3): qty 1

## 2019-12-17 MED ORDER — OLANZAPINE 5 MG PO TABS
5.0000 mg | ORAL_TABLET | Freq: Every day | ORAL | Status: DC
  Administered 2019-12-17 – 2019-12-18 (×2): 5 mg via ORAL
  Filled 2019-12-17 (×2): qty 1

## 2019-12-17 NOTE — Progress Notes (Signed)
Initial Nutrition Assessment  DOCUMENTATION CODES:   Not applicable  INTERVENTION:  - will order 30 ml prostat BID, each supplement provides 100 kcal and 15 grams protein. - will order Magic Cup with lunch meals, each supplement provides 290 kcal and 9 grams of protein. - will order daily multivitamin with minerals.    NUTRITION DIAGNOSIS:   Increased nutrient needs related to acute illness as evidenced by estimated needs.  GOAL:   Patient will meet greater than or equal to 90% of their needs  MONITOR:   PO intake, Supplement acceptance, Labs, Weight trends  REASON FOR ASSESSMENT:   Malnutrition Screening Tool  ASSESSMENT:   86 year old female with medical history of afib on Pradaxa and Toprol, stage 3-4 CKD, heart failure, and depression on Wellbutrin, Lexapro, lithium and Zyprexa nightly. She presented to the ED on 3/18 due to FTT x1-2 weeks. She was admitted for heart failure exacerbation and diuretics were ordered.  Patient reports that she usually has a good appetite but that over the past ~2 weeks she has noticed a decrease and a lack of interest in eating. Patient reports that lack of desire to eat has been ongoing for several months and she attributes this to feeling more depressed during the winter/cold months and not being as active.  She is unsure if her weight changed during that time. She denies any chewing or swallowing issues at baseline and denies experiencing any abdominal pain/pressure or nausea when she eats.   Per chart review, weight today is 205 lb, weight yesterday was 208 lb, and weight on 12/08/18 at [facility] was 230 lb. This indicates 25 lb weight loss (11% body weight) in the past 1 year; not significant for time frame. It is unable to be determined if this weight loss occurred more acutely and how much is attributable to the past 2 weeks.  Per notes: - FTT--thought to be 2/2 heart failure and depression - acute on chronic hypoxic respiratory failure   - recent flare in depression   Labs reviewed; creatinine: 1.67 mg/dl, GFR: 27 ml/min. Medications reviewed; 40 mg IV lasix BID, 112 mcg oral synthroid/day, 20 mEq Klor-Con/day, 1 tablet senokot BID.     NUTRITION - FOCUSED PHYSICAL EXAM:  completed; no muscle or fat wasting.   Diet Order:   Diet Order            Diet Heart Room service appropriate? Yes; Fluid consistency: Thin  Diet effective now              EDUCATION NEEDS:   Not appropriate for education at this time  Skin:  Skin Assessment: Reviewed RN Assessment  Last BM:  3/19  Height:   Ht Readings from Last 1 Encounters:  12/16/19 5\' 7"  (1.702 m)    Weight:   Wt Readings from Last 1 Encounters:  12/17/19 93 kg    Ideal Body Weight:  61.4 kg  BMI:  Body mass index is 32.11 kg/m.  Estimated Nutritional Needs:   Kcal:  1600-1800 kcal  Protein:  70-85 grams  Fluid:  >/= 1.5 L/day     Jarome Matin, MS, RD, LDN, CNSC Inpatient Clinical Dietitian RD pager # available in [facility]  After hours/weekend pager # available in [facility]

## 2019-12-17 NOTE — Progress Notes (Signed)
Patient struggling to remain sitting for orthostatic vital signs d/t weakness even with staff assist. Pt was straining and not holding arm still. Pulse became elevated. Pt refused to attempt to stand for the rest of the orthostatic vital signs. BP elevated. NP Ouma notified. New order for prn labetalol received and given.

## 2019-12-17 NOTE — Progress Notes (Signed)
Echocardiogram 2D Echocardiogram has been performed.  [place] Deputy Lashunta Frieden 12/17/2019, 11:05 AM

## 2019-12-17 NOTE — Progress Notes (Signed)
PROGRESS NOTE    Sonya Kaufman    Code Status: DNR  IRJ:[id number] DOB: Nov 28, 1930 DOA: 12/16/2019 LOS: 0 days  PCP: Shon Baton, MD CC:  Chief Complaint  Patient presents with  . Flank Pain  . Urinary Tract Infection       Hospital Summary   This is an 86 year old female with past medical history of atrial fibrillation on Pradaxa and Toprol, CKD 3b/4, heart failure (no echo on file), depression on Wellbutrin, Lexapro, lithium and Zyprexa nightly who presented to [facility] on 3/18 for failure to thrive x1 to 2 weeks.  She has since been admitted for heart failure exacerbation and has been receiving diuresis with significant output.  Further cardiac work-up ending.    A & P   Active Problems:   Failure to thrive in adult    1. Failure to thrive, Concern for secondary to suspected heart failure exacerbation and Depression a. TTE pending b. TSH unremarkable c. Increase Lasix to twice daily d. PT pending e. Orthostatic vitals unable to be obtained f. Continue telemetry 2. Acute on chronic hypoxic respiratory failure, concern for heart failure exacerbation a. Has been on 2 L/min O2 for the past 1 to 2 months but previously did not require O2.  Apparently was recently treated for pneumonia per daughter who is an Therapist, sports in oncology center has required O2 since that time, still requiring 3 L/min with improved cardiac wheeze b. BNP 504 c. No clear etiology for her heart failure exacerbation as she is compliant with diet and medications d. Weight down, unknown baseline.   e. Continue daily weights and intake/output f. Lasix 40 mg IV twice daily g. Echo pending h. Albuterol neb as needed 3. Depression a. Reports recent flareup of depression due to sedentary lifestyle, winter season, taking care of disabled child who is 53 years old and exacerbated by Covid pandemic.  Currently without suicidal ideation or plan.  She believes her depression has been affecting her overall mood and may be related  to her failure to thrive b. Continue Lexapro, Wellbutrin, Zyprexa nightly and lithium for now and if failure to thrive does not improve with diuresis then will consider antidepressant med rec 4. Rate controlled atrial fibrillation a. Continue Toprol XL b. Continue Pradaxa 5. Hypothyroidism a. Continue Synthroid b. Check TSH 6. CKD 3b/4 at baseline  DVT prophylaxis: Pradaxa Family Communication: Patient's daughter has been updated  Disposition Plan:   Patient came from:   Home                                                                                          Anticipated d/c place: TBD  Barriers to d/c: Continuing IV diuresis, echo, PT eval.  Hopeful discharge in 24 to 48 hours  Pressure injury documentation    None  Consultants  None  Procedures  None  Antibiotics   Anti-infectives (From admission, onward)   Start     Dose/Rate Route Frequency Ordered Stop   12/16/19 1330  cefTRIAXone (ROCEPHIN) 1 g in sodium chloride 0.9 % 100 mL IVPB     1 g 200 mL/hr over 30 Minutes Intravenous  Once  12/16/19 1245 12/16/19 1433        Subjective   Patient seen and examined at bedside in no acute distress and resting comfortably. No acute events overnight.  Admits to recent flareup of depression over the past several months as she takes care of her son who 57 years old and disabled in addition to winter season as well as COVID-19 pandemic leading to isolation.  Denies any suicidal or homicidal ideation/plan.  No other complaints  Objective   Vitals:   12/17/19 0628 12/17/19 0633 12/17/19 0634 12/17/19 0637  BP: (!) 172/90 (!) 164/110  (!) 175/95  Pulse: 88 (!) 120 (!) 104 91  Resp: 18 19  18   Temp: 98.6 F (37 C)     TempSrc: Oral     SpO2: 96% 93% 93% 96%  Weight:      Height:        Intake/Output Summary (Last 24 hours) at 12/17/2019 1323 Last data filed at 12/17/2019 0600 Gross per 24 hour  Intake 1080 ml  Output 2600 ml  Net -1520 ml   Filed Weights    12/16/19 1953 12/17/19 0500  Weight: 94.3 kg 93 kg    Examination:  Physical Exam Vitals and nursing note reviewed.  Constitutional:      Appearance: Normal appearance.  HENT:     Head: Normocephalic and atraumatic.  Eyes:     Conjunctiva/sclera: Conjunctivae normal.  Cardiovascular:     Rate and Rhythm: Normal rate and regular rhythm.  Pulmonary:     Effort: Pulmonary effort is normal.     Breath sounds: Normal breath sounds.  Abdominal:     General: Abdomen is flat.     Palpations: Abdomen is soft.  Musculoskeletal:        General: No swelling or tenderness.  Skin:    Coloration: Skin is not jaundiced or pale.  Neurological:     Mental Status: She is alert. Mental status is at baseline.  Psychiatric:        Mood and Affect: Mood is depressed.        Behavior: Behavior normal.     Data Reviewed: I have personally reviewed following labs and imaging studies  CBC: Recent Labs  Lab 12/16/19 1158 12/17/19 0346  WBC 7.1 6.7  NEUTROABS 4.8  --   HGB 14.6 14.7  HCT 48.2* 48.6*  MCV 101.0* 101.0*  PLT 215 867   Basic Metabolic Panel: Recent Labs  Lab 12/16/19 1158 12/17/19 0346 12/17/19 0434  NA 138 140  --   K 4.8 4.2  --   CL 100 100  --   CO2 29 29  --   GLUCOSE 102* 124*  --   BUN 26* 23  --   CREATININE 1.71* 1.67*  --   CALCIUM 9.6 9.4  --   MG  --   --  2.1   GFR: Estimated Creatinine Clearance: 28.3 mL/min (A) (by C-G formula based on SCr of 1.67 mg/dL (H)). Liver Function Tests: Recent Labs  Lab 12/16/19 1158  AST 19  ALT 15  ALKPHOS 81  BILITOT 0.9  PROT 6.7  ALBUMIN 3.6   No results for input(s): LIPASE, AMYLASE in the last 168 hours. No results for input(s): AMMONIA in the last 168 hours. Coagulation Profile: No results for input(s): INR, PROTIME in the last 168 hours. Cardiac Enzymes: No results for input(s): CKTOTAL, CKMB, CKMBINDEX, TROPONINI in the last 168 hours. BNP (last 3 results) No results for input(s): PROBNP in the  last 8760 hours. HbA1C: Recent Labs    12/16/19 2027  HGBA1C 5.9*   CBG: No results for input(s): GLUCAP in the last 168 hours. Lipid Profile: No results for input(s): CHOL, HDL, LDLCALC, TRIG, CHOLHDL, LDLDIRECT in the last 72 hours. Thyroid Function Tests: Recent Labs    12/16/19 1212  TSH 1.664   Anemia Panel: No results for input(s): VITAMINB12, FOLATE, FERRITIN, TIBC, IRON, RETICCTPCT in the last 72 hours. Sepsis Labs: Recent Labs  Lab 12/16/19 1158  LATICACIDVEN 1.8    Recent Results (from the past 240 hour(s))  Blood culture (routine x 2)     Status: None (Preliminary result)   Collection Time: 12/16/19 11:58 AM   Specimen: BLOOD  Result Value Ref Range Status   Specimen Description   Final    BLOOD RIGHT ANTECUBITAL Performed at [facility] [address] [place], [place] [address]    Special Requests   Final    BOTTLES DRAWN AEROBIC AND ANAEROBIC Blood Culture results may not be optimal due to an excessive volume of blood received in culture bottles Performed at [facility] [address], [place], [place] [address]    Culture   Final    NO GROWTH < 24 HOURS Performed at [facility] [address], [place], [place] [address]    Report Status PENDING  Incomplete  Urine Culture     Status: None   Collection Time: 12/16/19 11:59 AM   Specimen: In/Out Cath Urine  Result Value Ref Range Status   Specimen Description   Final    IN/OUT CATH URINE Performed at [facility] [address], [place], [place] [address]    Special Requests   Final    NONE Performed at [facility], [place] [address], [place], [place] [address]    Culture   Final    NO GROWTH Performed at [facility], [place] [address], [place], [place] [address]    Report Status 12/17/2019 FINAL  Final  Blood culture (routine x 2)     Status: None (Preliminary result)   Collection Time: 12/16/19 12:03  PM   Specimen: BLOOD RIGHT HAND  Result Value Ref Range Status   Specimen Description   Final    BLOOD RIGHT HAND Performed at [facility] [address] [place], [place] [address]    Special Requests   Final    BOTTLES DRAWN AEROBIC AND ANAEROBIC Blood Culture adequate volume Performed at [facility] [address], [place], [place] [address]    Culture   Final    NO GROWTH < 24 HOURS Performed at [facility] [address], [place],  [address]    Report Status PENDING  Incomplete  SARS CORONAVIRUS 2 (TAT 6-24 HRS) Nasopharyngeal Nasopharyngeal Swab     Status: None   Collection Time: 12/16/19  2:37 PM   Specimen: Nasopharyngeal Swab  Result Value Ref Range Status   SARS Coronavirus 2 NEGATIVE NEGATIVE Final    Comment: (NOTE) SARS-CoV-2 target nucleic acids are NOT DETECTED. The SARS-CoV-2 RNA is generally detectable in upper and lower respiratory specimens during the acute phase of infection. Negative results do not preclude SARS-CoV-2 infection, do not rule out co-infections with other pathogens, and should not be used as the sole basis for treatment or other patient management decisions. Negative results must be combined with clinical observations, patient history, and epidemiological information. The expected result is  Negative. Fact Sheet for Patients: SugarRoll.be Fact Sheet for Healthcare Providers: https://www.woods-mathews.com/ This test is not yet approved or cleared by the [place] FDA and  has been authorized for detection and/or diagnosis of SARS-CoV-2 by FDA under an Emergency Use Authorization (EUA). This EUA will remain  in effect (meaning this test can be used) for the duration of the COVID-19 declaration under Section 56 4(b)(1) of the Act, 21 U.S.C. section 360bbb-3(b)(1), unless the authorization is terminated or revoked sooner. Performed at [facility], [place] [address], [place], [place] [address]          Radiology Studies: CT Head Wo Contrast  Result Date: 12/16/2019 CLINICAL DATA:  Dizziness. EXAM: CT HEAD WITHOUT CONTRAST TECHNIQUE: Contiguous axial images were obtained from the base of the skull through the vertex without intravenous contrast. COMPARISON:  December 03, 2015. FINDINGS: Brain: Mild chronic ischemic white matter disease is noted. No mass effect or midline shift is noted. Ventricular size is within normal limits. There is no evidence of mass lesion, hemorrhage or acute infarction. Vascular: No hyperdense vessel or unexpected calcification. Skull: Normal. Negative for fracture or focal lesion. Sinuses/Orbits: No acute finding. Other: None. IMPRESSION: Mild chronic ischemic white matter disease. No acute intracranial abnormality seen. Electronically Signed   By: Marijo Conception M.D.   On: 12/16/2019 15:14   DG Chest Portable 1 View  Result Date: 12/16/2019 CLINICAL DATA:  Shortness of breath. Diagnosed with UTI 4 days ago and given Bactrim. Stop taking antibiotics 2 days ago. EXAM: PORTABLE CHEST 1 VIEW COMPARISON:  04/02/2014 FINDINGS: Patient slightly rotated to the right. Lungs are adequately inflated without focal airspace consolidation or effusion. No pneumothorax. Borderline stable cardiomegaly. Remainder the exam is unchanged. IMPRESSION: No acute cardiopulmonary disease. Electronically Signed   By: Marin Olp M.D.   On: 12/16/2019 12:22   CT Renal Stone Study  Result Date: 12/16/2019 CLINICAL DATA:  Flank pain.  Recent UTI EXAM: CT ABDOMEN AND PELVIS WITHOUT CONTRAST TECHNIQUE: Multidetector CT imaging of the abdomen and pelvis was performed following the standard protocol without IV contrast. COMPARISON:  None. FINDINGS: Lower chest: Cardiomegaly.  No acute abnormality. Hepatobiliary: Small layering gallstones within the gallbladder. No focal hepatic abnormality. No biliary ductal dilatation. Pancreas: No focal  abnormality or ductal dilatation. Spleen: No focal abnormality.  Normal size. Adrenals/Urinary Tract: Low-density nodule within the left adrenal gland measures 2.5 cm most compatible with adenoma. Small exophytic lesion off the upper pole of the left kidney measures 2 cm, likely cyst. No renal or ureteral stones. No hydronephrosis. Urinary bladder unremarkable. Stomach/Bowel: Few scattered right colonic diverticula. No active diverticulitis. Normal appendix. Stomach and small bowel decompressed, unremarkable. Vascular/Lymphatic: Aortic atherosclerosis. No enlarged abdominal or pelvic lymph nodes. Reproductive: Uterus and adnexa unremarkable.  No mass. Other: No free fluid or free air. Small umbilical hernia containing fat. Musculoskeletal: No acute bony abnormality. IMPRESSION: No renal or ureteral stones.  No hydronephrosis. Cholelithiasis. Aortic atherosclerosis. Umbilical hernia containing fat. Electronically Signed   By: Rolm Baptise M.D.   On: 12/16/2019 15:14        Scheduled Meds: . buPROPion  300 mg Oral Daily  . dabigatran  75 mg Oral BID  . escitalopram  40 mg Oral Daily  . furosemide  40 mg Intravenous Daily  . levothyroxine  112 mcg Oral Q0600  . lithium carbonate  300 mg Oral Daily  . metoprolol succinate  25 mg Oral BID  . OLANZapine  2.5 mg Oral QHS  .  potassium chloride SA  20 mEq Oral Daily  . senna  1 tablet Oral BID  . simvastatin  20 mg Oral Daily   Continuous Infusions:   Time spent: 26 minutes with over 50% of the time coordinating the patient's care    Harold Hedge, DO [facility] Pager [phone]  Call night coverage person covering after 7pm

## 2019-12-17 NOTE — Evaluation (Signed)
Physical Therapy Evaluation Patient Details Name: Sonya Kaufman MRN: [id number] DOB: 1931-04-16 Today's Date: 12/17/2019   History of Present Illness  Pt is an 86 year old female admitted for Failure to thrive, Concern for secondary to suspected heart failure exacerbation and Depression and has PMHx significant for tachycardia-bradycardia, afib, CHF, venous insufficiency  Clinical Impression  Pt admitted with above diagnosis.  Pt currently with functional limitations due to the deficits listed below (see PT Problem List). Pt will benefit from skilled PT to increase their independence and safety with mobility to allow discharge to the venue listed below.  Pt requiring mod assist to stand and pivot to recliner today.  Pt reports generally feeling weak and fatigued.  Pt is caregiver to her son who has CP, however her 2 daughters also assist her.  Pt would benefit from SNF upon d/c.     Follow Up Recommendations SNF    Equipment Recommendations  None recommended by PT    Recommendations for Other Services       Precautions / Restrictions Precautions Precautions: Fall Precaution Comments: chronic oxygen      Mobility  Bed Mobility Overal bed mobility: Needs Assistance Bed Mobility: Supine to Sit     Supine to sit: Min assist;HOB elevated     General bed mobility comments: assist for trunk upright  Transfers Overall transfer level: Needs assistance Equipment used: Rolling walker (2 wheeled) Transfers: Sit to/from [facility] Sit to Stand: Mod assist Stand pivot transfers: Mod assist;Min assist       General transfer comment: verbal cues for hand placement and technique, pt with posterior lean upon standing and able to correct with cues and use of RW, slow to pivot to recliner, remained on 2L O2 [place]  Ambulation/Gait                Stairs            Wheelchair Mobility    Modified Rankin (Stroke Patients Only)       Balance Overall  balance assessment: Needs assistance         Standing balance support: Bilateral upper extremity supported Standing balance-Leahy Scale: Poor Standing balance comment: reliant on UE support                             Pertinent Vitals/Pain Pain Assessment: No/denies pain    Home Living Family/patient expects to be discharged to:: Private residence Living Arrangements: Children Available Help at Discharge: Family;Available PRN/intermittently Type of Home: House Home Access: Ramped entrance     Home Layout: One level Home Equipment: Walker - 4 wheels Additional Comments: 2 daughters help pt with pt's son who has CP and w/c level    Prior Function Level of Independence: Independent with assistive device(s)         Comments: uses rollator     Hand Dominance        Extremity/Trunk Assessment   Upper Extremity Assessment Upper Extremity Assessment: Generalized weakness    Lower Extremity Assessment Lower Extremity Assessment: Generalized weakness       Communication   Communication: HOH  Cognition Arousal/Alertness: Awake/alert Behavior During Therapy: WFL for tasks assessed/performed Overall Cognitive Status: Within Functional Limits for tasks assessed  General Comments      Exercises     Assessment/Plan    PT Assessment Patient needs continued PT services  PT Problem List Decreased strength;Decreased mobility;Decreased activity tolerance;Decreased balance;Decreased knowledge of use of DME       PT Treatment Interventions DME instruction;Gait training;Balance training;Therapeutic exercise;Functional mobility training;Therapeutic activities;Patient/family education    PT Goals (Current goals can be found in the Care Plan section)  Acute Rehab PT Goals PT Goal Formulation: With patient Time For Goal Achievement: 12/24/19 Potential to Achieve Goals: Good    Frequency Min 2X/week    Barriers to discharge        Co-evaluation               AM-PAC PT "6 Clicks" Mobility  Outcome Measure Help needed turning from your back to your side while in a flat bed without using bedrails?: A Little Help needed moving from lying on your back to sitting on the side of a flat bed without using bedrails?: A Little Help needed moving to and from a bed to a chair (including a wheelchair)?: A Lot Help needed standing up from a chair using your arms (e.g., wheelchair or bedside chair)?: A Lot Help needed to walk in hospital room?: A Lot Help needed climbing 3-5 steps with a railing? : Total 6 Click Score: 13    End of Session Equipment Utilized During Treatment: Gait belt;Oxygen Activity Tolerance: Patient tolerated treatment well Patient left: in chair;with call bell/phone within reach;with chair alarm set Nurse Communication: Mobility status PT Visit Diagnosis: Other abnormalities of gait and mobility (R26.89);Muscle weakness (generalized) (M62.81)    Time: 0350-0938 PT Time Calculation (min) (ACUTE ONLY): 15 min   Charges:   PT Evaluation $PT Eval Low Complexity: 1 Low         Kati PT, DPT Acute Rehabilitation Services Office: [phone]   York Ram E 12/17/2019, 4:14 PM

## 2019-12-18 LAB — CBC
HCT: 47.9 % — ABNORMAL HIGH (ref 36.0–46.0)
Hemoglobin: 14.7 g/dL (ref 12.0–15.0)
MCH: 30.7 pg (ref 26.0–34.0)
MCHC: 30.7 g/dL (ref 30.0–36.0)
MCV: 100 fL (ref 80.0–100.0)
Platelets: 184 10*3/uL (ref 150–400)
RBC: 4.79 MIL/uL (ref 3.87–5.11)
RDW: 13.7 % (ref 11.5–15.5)
WBC: 6.8 10*3/uL (ref 4.0–10.5)
nRBC: 0 % (ref 0.0–0.2)

## 2019-12-18 LAB — BASIC METABOLIC PANEL
Anion gap: 11 (ref 5–15)
BUN: 25 mg/dL — ABNORMAL HIGH (ref 8–23)
CO2: 31 mmol/L (ref 22–32)
Calcium: 9.4 mg/dL (ref 8.9–10.3)
Chloride: 99 mmol/L (ref 98–111)
Creatinine, Ser: 1.6 mg/dL — ABNORMAL HIGH (ref 0.44–1.00)
GFR calc Af Amer: 33 mL/min — ABNORMAL LOW (ref >60)
GFR calc non Af Amer: 29 mL/min — ABNORMAL LOW (ref >60)
Glucose, Bld: 106 mg/dL — ABNORMAL HIGH (ref 70–99)
Potassium: 4 mmol/L (ref 3.5–5.1)
Sodium: 141 mmol/L (ref 135–145)

## 2019-12-18 LAB — PHOSPHORUS: Phosphorus: 4.4 mg/dL (ref 2.5–4.6)

## 2019-12-18 MED ORDER — ACETAMINOPHEN 325 MG PO TABS
650.0000 mg | ORAL_TABLET | Freq: Once | ORAL | Status: AC
  Administered 2019-12-18: 650 mg via ORAL
  Filled 2019-12-18: qty 2

## 2019-12-18 MED ORDER — FUROSEMIDE 10 MG/ML IJ SOLN: 40.0000 mg | Freq: Every day | INTRAMUSCULAR | Status: DC

## 2019-12-18 NOTE — TOC Initial Note (Addendum)
Transition of Care [facility]) - Initial/Assessment Note    Patient Details  Name: Sonya Kaufman MRN: [id number] Date of Birth: 1931-01-14  [facility] CM/SW Contact:    Trish Mage, LCSW Phone Number: 12/18/2019, 2:33 PM  Clinical Narrative:   Met with patient in response to PT recommendation of SNF.  Daughter Sonya Kaufman was bedside, and [name] gave me permission to speak with her as well.  Sonya Kaufman states at some point, her mother may need to go to SNF, but right now they are still able to manage her at home.  She or her sister or brother are always with her mother in the home to help. They are used to helping her with transfers, ADLs as needed.  Besides patient, there is another brother in the home with CP that requires care as well.  DME includes wheelchair, rollator, RW, ramp into home, 3 in 1 and shower bench.Also has O2 as supplied by [employer]. [name] is currently receiving [facility] RN services from [place], and Sonya Kaufman requested they we ask for expanded services of PT and OT as well.  TOC will continue to follow during the course of hospitalization.  [name] has gotten both her COVID vaccines. Daughter states that patient suffers from Major depression and is prescribed 2 anti depressants and an anti-psychotic.  I did not ask who prescribes.              Expected Discharge Plan: [place] Barriers to Discharge: No Barriers Identified   Patient Goals and CMS Choice Patient states their goals for this hospitalization and ongoing recovery are:: "I want to go home."      Expected Discharge Plan and Services Expected Discharge Plan: [place]   Discharge Planning Services: CM Consult Post Acute Care Choice: [place] arrangements for the past 2 months: Single Family Home                                      Prior Living Arrangements/Services Living arrangements for the past 2 months: Single Family Home Lives with:: Self,  Adult Children Patient language and need for interpreter reviewed:: Yes Do you feel safe going back to the place where you live?: Yes      Need for Family Participation in Patient Care: Yes (Comment) Care giver support system in place?: Yes (comment) Current home services: DME, Home RN Criminal Activity/Legal Involvement Pertinent to Current Situation/Hospitalization: No - Comment as needed  Activities of Daily Living Home Assistive Devices/Equipment: Walker (specify type), Bedside commode/3-in-1, Oxygen ADL Screening (condition at time of admission) Patient's cognitive ability adequate to safely complete daily activities?: Yes Is the patient deaf or have difficulty hearing?: Yes Does the patient have difficulty seeing, even when wearing glasses/contacts?: No Does the patient have difficulty concentrating, remembering, or making decisions?: No Patient able to express need for assistance with ADLs?: Yes Does the patient have difficulty dressing or bathing?: Yes Independently performs ADLs?: No Communication: Independent Dressing (OT): Needs assistance Is this a change from baseline?: Pre-admission baseline Grooming: Needs assistance Is this a change from baseline?: Pre-admission baseline Feeding: Needs assistance Is this a change from baseline?: Pre-admission baseline Bathing: Needs assistance Is this a change from baseline?: Pre-admission baseline Toileting: Needs assistance Is this a change from baseline?: Pre-admission baseline In/Out Bed: Needs assistance Is this a change from baseline?: Pre-admission baseline Walks in Home:  Needs assistance Is this a change from baseline?: Pre-admission baseline Does the patient have difficulty walking or climbing stairs?: Yes Weakness of Legs: Both Weakness of Arms/Hands: Both  Permission Sought/Granted Permission sought to share information with : Family Supports Permission granted to share information with : Yes, Verbal Permission  Granted  Share Information with NAME: Sonya Kaufman     Permission granted to share info w Relationship: daughter     Emotional Assessment Appearance:: Appears stated age Attitude/Demeanor/Rapport: Engaged Affect (typically observed): Appropriate Orientation: : Oriented to Self, Oriented to Place, Oriented to Situation Alcohol / Substance Use: Not Applicable Psych Involvement: Yes (comment)(depression)  Admission diagnosis:  Failure to thrive in adult [R62.7] Acute encephalopathy [G93.40] Patient Active Problem List   Diagnosis Date Noted  . Failure to thrive in adult 12/16/2019  . Acute on chronic diastolic ACC/AHA stage C congestive heart failure ([place]) 03/27/2015  . Chronic diastolic CHF (congestive heart failure), NYHA class 2 ([place]) 04/02/2014  . Permanent atrial fibrillation ([place]) 04/02/2014  . Chest pain with moderate risk for cardiac etiology 04/02/2014  . Precordial pain 04/02/2014  . Respiratory distress, acute 11/04/2013  . COPD exacerbation ([place]) 11/03/2013  . COPD with acute exacerbation ([place]) 11/03/2013  . Renal insufficiency 11/29/2011  . Bradycardia 11/29/2011  . Respiratory failure with hypoxia ([place]) 11/21/2011  . Polydipsia 07/29/2011  . Atrial flutter ([place]) 01/28/2011  . Atrial flutter --RVR 01/11/2011  . Chronic diastolic heart failure ([place]) 12/13/2010  . Acute on chronic renal insufficiency 12/13/2010  . COLONIC POLYPS 08/03/2010  . Atrial fibrillation ([place]) 08/03/2010  . DIVERTICULOSIS OF COLON 08/03/2010  . CONSTIPATION 06/01/2009  . OVERWEIGHT 02/02/2008  . CEREBROVASCULAR DISEASE 02/02/2008  . VENOUS INSUFFICIENCY 02/02/2008  . OSTEOPENIA 02/02/2008  . HYPOTHYROIDISM 12/31/2007  . HYPERLIPIDEMIA 12/31/2007  . DEPRESSION 12/31/2007  . ASTHMATIC BRONCHITIS, ACUTE 12/31/2007  . OSTEOARTHRITIS 12/31/2007   PCP:  Shon Baton, MD Pharmacy:   [place], [place] - [address] [place] [place]  [address] Phone: [phone] Fax: [phone]     Social Determinants of Health (SDOH) Interventions    Readmission Risk Interventions No flowsheet data found.

## 2019-12-18 NOTE — Progress Notes (Addendum)
PROGRESS NOTE    Sonya Kaufman    Code Status: DNR  JME:[id number] DOB: January 10, 1931 DOA: 12/16/2019 LOS: 1 days  PCP: Shon Baton, MD CC:  Chief Complaint  Patient presents with  . Flank Pain  . Urinary Tract Infection       Hospital Summary   This is an 86 year old female with past medical history of atrial fibrillation on Pradaxa and Toprol, CKD 4, heart failure (no echo on file), depression on Wellbutrin, Lexapro, lithium and Zyprexa nightly who presented to [facility] on 3/18 for failure to thrive x1 to 2 weeks.  She has since been admitted for heart failure exacerbation and has been receiving diuresis with improved respiratory symptoms. Still on O2    A & P   Active Problems:   Failure to thrive in adult     1. Failure to thrive, Concern for secondary to suspected heart failure exacerbation and Depression a. Echo improved from prior b. TSH unremarkable c. Increase Lasix to twice daily d. PT pending e. Orthostatic vitals unable to be obtained f. Continue telemetry 2. Acute on chronic hypoxic respiratory failure, concern for heart failure exacerbation a. Has been on 2 L/min O2 for the past 1 to 2 months but previously did not require O2.  Apparently was recently treated for pneumonia per daughter who is an Therapist, sports in oncology center has required O2 since that time, still requiring 3 L/min with improved cardiac wheeze b. BNP 504 c. No clear etiology for her heart failure exacerbation as she is compliant with diet and medications d. Weight down, unknown baseline.   e. Continue daily weights and intake/output f. Lasix 40 mg IV daily g. Echo pending h. Albuterol neb as needed i. Incentive spirometry added 3. Depression a. Reports recent flareup of depression due to sedentary lifestyle, winter season, taking care of disabled child who is 57 years old and exacerbated by Covid pandemic.  Currently without suicidal ideation or plan.  She believes her depression has been affecting her overall  mood and may be related to her failure to thrive b. Continue Lexapro, Wellbutrin, Zyprexa nightly and lithium for now and if failure to thrive does not improve with diuresis then will consider antidepressant med rec c. Increased Zyprexa at night 4. Rate controlled atrial fibrillation a. Continue Toprol XL b. Continue Pradaxa 5. Hypothyroidism a. Continue Synthroid b. Check TSH 6. CKD 4 at baseline  DVT prophylaxis: Pradaxa Family Communication: Patient's daughter has been updated  Disposition Plan:   Patient came from:   Home                                                                                          Anticipated d/c place: [facility] PT/OT/RN  Barriers to d/c: Continuing IV diuresis. Hopeful discharge in 24 to 48 hours  Pressure injury documentation    None  Consultants  None  Procedures  None  Antibiotics   Anti-infectives (From admission, onward)    Start     Dose/Rate Route Frequency Ordered Stop   12/16/19 1330  cefTRIAXone (ROCEPHIN) 1 g in sodium chloride 0.9 % 100 mL IVPB     1 g  200 mL/hr over 30 Minutes Intravenous  Once 12/16/19 1245 12/16/19 1433         Subjective   States she feels a bit better today. Improved breathing. Had trouble sleeping yesterday so Zyprexa was increased last night and she had slept better. No other complaints.   Objective   Vitals:   12/17/19 2119 12/18/19 0439 12/18/19 0503 12/18/19 1446  BP: (!) 142/76 (!) 157/101 (!) 147/98 139/67  Pulse: 61 (!) 112 91 85  Resp: 18 18  (!) 22  Temp: 98.2 F (36.8 C) 98.7 F (37.1 C)  97.6 F (36.4 C)  TempSrc: Oral Oral  Oral  SpO2: 91% 94%  94%  Weight:      Height:        Intake/Output Summary (Last 24 hours) at 12/18/2019 1452 Last data filed at 12/18/2019 1452 Gross per 24 hour  Intake --  Output 1600 ml  Net -1600 ml   Filed Weights   12/16/19 1953 12/17/19 0500  Weight: 94.3 kg 93 kg    Examination:  Physical Exam Vitals and nursing note reviewed. Exam  conducted with a chaperone present.  Constitutional:      Appearance: Normal appearance.  HENT:     Head: Normocephalic and atraumatic.  Eyes:     Conjunctiva/sclera: Conjunctivae normal.  Pulmonary:     Effort: Pulmonary effort is normal. No respiratory distress.     Comments: Still with nasal canula  Abdominal:     General: Abdomen is flat.     Palpations: Abdomen is soft.  Musculoskeletal:        General: No swelling or tenderness.  Skin:    Coloration: Skin is not jaundiced or pale.  Neurological:     Mental Status: She is alert. Mental status is at baseline.  Psychiatric:        Mood and Affect: Mood normal.        Behavior: Behavior normal.     Data Reviewed: I have personally reviewed following labs and imaging studies  CBC: Recent Labs  Lab 12/16/19 1158 12/17/19 0346 12/18/19 0319  WBC 7.1 6.7 6.8  NEUTROABS 4.8  --   --   HGB 14.6 14.7 14.7  HCT 48.2* 48.6* 47.9*  MCV 101.0* 101.0* 100.0  PLT 215 186 812   Basic Metabolic Panel: Recent Labs  Lab 12/16/19 1158 12/17/19 0346 12/17/19 0434 12/18/19 0319  NA 138 140  --  141  K 4.8 4.2  --  4.0  CL 100 100  --  99  CO2 29 29  --  31  GLUCOSE 102* 124*  --  106*  BUN 26* 23  --  25*  CREATININE 1.71* 1.67*  --  1.60*  CALCIUM 9.6 9.4  --  9.4  MG  --   --  2.1  --    GFR: Estimated Creatinine Clearance: 29.6 mL/min (A) (by C-G formula based on SCr of 1.6 mg/dL (H)). Liver Function Tests: Recent Labs  Lab 12/16/19 1158  AST 19  ALT 15  ALKPHOS 81  BILITOT 0.9  PROT 6.7  ALBUMIN 3.6   No results for input(s): LIPASE, AMYLASE in the last 168 hours. No results for input(s): AMMONIA in the last 168 hours. Coagulation Profile: No results for input(s): INR, PROTIME in the last 168 hours. Cardiac Enzymes: No results for input(s): CKTOTAL, CKMB, CKMBINDEX, TROPONINI in the last 168 hours. BNP (last 3 results) No results for input(s): PROBNP in the last 8760 hours. HbA1C: Recent Labs  12/16/19 2027  HGBA1C 5.9*   CBG: No results for input(s): GLUCAP in the last 168 hours. Lipid Profile: No results for input(s): CHOL, HDL, LDLCALC, TRIG, CHOLHDL, LDLDIRECT in the last 72 hours. Thyroid Function Tests: Recent Labs    12/16/19 1212  TSH 1.664   Anemia Panel: No results for input(s): VITAMINB12, FOLATE, FERRITIN, TIBC, IRON, RETICCTPCT in the last 72 hours. Sepsis Labs: Recent Labs  Lab 12/16/19 1158  LATICACIDVEN 1.8    Recent Results (from the past 240 hour(s))  Blood culture (routine x 2)     Status: None (Preliminary result)   Collection Time: 12/16/19 11:58 AM   Specimen: BLOOD  Result Value Ref Range Status   Specimen Description   Final    BLOOD RIGHT ANTECUBITAL Performed at [facility] [address] [place], [place] [address]    Special Requests   Final    BOTTLES DRAWN AEROBIC AND ANAEROBIC Blood Culture results may not be optimal due to an excessive volume of blood received in culture bottles Performed at [facility] [address] [place], [place] [address]    Culture   Final    NO GROWTH 2 DAYS Performed at [facility] [address], [place], [place] [address]    Report Status PENDING  Incomplete  Urine Culture     Status: None   Collection Time: 12/16/19 11:59 AM   Specimen: In/Out Cath Urine  Result Value Ref Range Status   Specimen Description   Final    IN/OUT CATH URINE Performed at [facility] [address], [place], [place] [address]    Special Requests   Final    NONE Performed at [facility], [place] [address] [place], [place] [address]    Culture   Final    NO GROWTH Performed at [facility], [place] [address], [place], [place] [address]    Report Status 12/17/2019 FINAL  Final  Blood culture (routine x 2)     Status: None (Preliminary result)   Collection Time: 12/16/19 12:03 PM   Specimen: BLOOD RIGHT HAND  Result Value  Ref Range Status   Specimen Description   Final    BLOOD RIGHT HAND Performed at [facility] [address] [place], [place] [address]    Special Requests   Final    BOTTLES DRAWN AEROBIC AND ANAEROBIC Blood Culture adequate volume Performed at [facility] [address] [place], [place] [address]    Culture   Final    NO GROWTH 2 DAYS Performed at [facility] [address], [place],  [address]    Report Status PENDING  Incomplete  SARS CORONAVIRUS 2 (TAT 6-24 HRS) Nasopharyngeal Nasopharyngeal Swab     Status: None   Collection Time: 12/16/19  2:37 PM   Specimen: Nasopharyngeal Swab  Result Value Ref Range Status   SARS Coronavirus 2 NEGATIVE NEGATIVE Final    Comment: (NOTE) SARS-CoV-2 target nucleic acids are NOT DETECTED. The SARS-CoV-2 RNA is generally detectable in upper and lower respiratory specimens during the acute phase of infection. Negative results do not preclude SARS-CoV-2 infection, do not rule out co-infections with other pathogens, and should not be used as the sole basis for treatment or other patient management decisions. Negative results must be combined with clinical observations, patient history, and epidemiological information. The expected result is Negative. Fact Sheet for Patients: SugarRoll.be Fact Sheet for Healthcare Providers:  https://www.woods-mathews.com/ This test is not yet approved or cleared by the [place] and  has been authorized for detection and/or diagnosis of SARS-CoV-2 by FDA under an Emergency Use Authorization (EUA). This EUA will remain  in effect (meaning this test can be used) for the duration of the COVID-19 declaration under Section 56 4(b)(1) of the Act, 21 U.S.C. section 360bbb-3(b)(1), unless the authorization is terminated or revoked sooner. Performed at [facility], [place] [address] [place],  [place] [address]          Radiology Studies: CT Head Wo Contrast  Result Date: 12/16/2019 CLINICAL DATA:  Dizziness. EXAM: CT HEAD WITHOUT CONTRAST TECHNIQUE: Contiguous axial images were obtained from the base of the skull through the vertex without intravenous contrast. COMPARISON:  December 03, 2015. FINDINGS: Brain: Mild chronic ischemic white matter disease is noted. No mass effect or midline shift is noted. Ventricular size is within normal limits. There is no evidence of mass lesion, hemorrhage or acute infarction. Vascular: No hyperdense vessel or unexpected calcification. Skull: Normal. Negative for fracture or focal lesion. Sinuses/Orbits: No acute finding. Other: None. IMPRESSION: Mild chronic ischemic white matter disease. No acute intracranial abnormality seen. Electronically Signed   By: Marijo Conception M.D.   On: 12/16/2019 15:14   ECHOCARDIOGRAM COMPLETE  Result Date: 12/17/2019    ECHOCARDIOGRAM REPORT   Patient Name:   LAKENZIE MCCLAFFERTY Date of Exam: 12/17/2019 Medical Rec #:  [id number]     Height:       67.0 in Accession #:    [id number]    Weight:       205.0 lb Date of Birth:  03/06/1931     BSA:          2.044 m Patient Age:    54 years      BP:           175/95 mmHg Patient Gender: F             HR:           86 bpm. Exam Location:  Inpatient Procedure: 2D Echo, Color Doppler, Cardiac Doppler and Intracardiac            Opacification Agent                               MODIFIED REPORT: This report was modified by Cherlynn Kaiser MD on 12/17/2019 due to amendment.  Indications:     R06.9 DOE  History:         Patient has prior history of Echocardiogram examinations, most                  recent 11/18/2011. CHF, COPD, Arrythmias:Atrial Fibrillation and                  Atrial Flutter; Risk Factors:Dyslipidemia.  Sonographer:     Raquel Sarna Senior RDCS Referring Phys:  [id number] Harold Hedge Diagnosing Phys: Cherlynn Kaiser MD  Sonographer Comments: Technically difficult study due to poor echo windows.  IMPRESSIONS  1. Left ventricular ejection fraction, by estimation, is 65 to 70%. The left ventricle has normal function. Left ventricular endocardial border not optimally defined to evaluate regional wall motion. There is mild left ventricular hypertrophy. Left ventricular diastolic function could not be evaluated. Left ventricular diastolic parameters are indeterminate.  2. Right ventricular systolic function is normal. The right ventricular size is normal. Tricuspid regurgitation signal is inadequate  for assessing PA pressure.  3. Left atrial size was mild to moderately dilated.  4. The mitral valve is grossly normal. No evidence of mitral valve regurgitation. No evidence of mitral stenosis.  5. The aortic valve was not well visualized. Aortic valve regurgitation is not visualized. No aortic stenosis is present.  6. The inferior vena cava is normal in size with greater than 50% respiratory variability, suggesting right atrial pressure of 3 mmHg. FINDINGS  Left Ventricle: Left ventricular ejection fraction, by estimation, is 65 to 70%. The left ventricle has normal function. Left ventricular endocardial border not optimally defined to evaluate regional wall motion. The left ventricular internal cavity size was normal in size. There is mild left ventricular hypertrophy. Left ventricular diastolic function could not be evaluated due to nondiagnostic images. Left ventricular diastolic parameters are indeterminate. Right Ventricle: The right ventricular size is normal. No increase in right ventricular wall thickness. Right ventricular systolic function is normal. Tricuspid regurgitation signal is inadequate for assessing PA pressure. Left Atrium: Left atrial size was mild to moderately dilated. Right Atrium: Right atrial size was not well visualized. Pericardium: There is no evidence of pericardial effusion. Presence of pericardial fat pad. Mitral Valve: The mitral valve is grossly normal. Normal mobility of the mitral  valve leaflets. No evidence of mitral valve regurgitation. No evidence of mitral valve stenosis. Tricuspid Valve: The tricuspid valve is not well visualized. Tricuspid valve regurgitation is not demonstrated. No evidence of tricuspid stenosis. Aortic Valve: The aortic valve was not well visualized. Aortic valve regurgitation is not visualized. No aortic stenosis is present. Pulmonic Valve: The pulmonic valve was not well visualized. Pulmonic valve regurgitation is not visualized. No evidence of pulmonic stenosis. Aorta: The aortic root is normal in size and structure. Venous: The inferior vena cava is normal in size with greater than 50% respiratory variability, suggesting right atrial pressure of 3 mmHg. IAS/Shunts: No atrial level shunt detected by color flow Doppler.  LEFT VENTRICLE PLAX 2D LVIDd:         4.30 cm LVIDs:         3.50 cm LV PW:         1.00 cm LV IVS:        1.00 cm LVOT diam:     2.00 cm LV SV:         47 LV SV Index:   23 LVOT Area:     3.14 cm  RIGHT VENTRICLE RV S prime:     9.00 cm/s TAPSE (M-mode): 1.6 cm LEFT ATRIUM             Index       RIGHT ATRIUM           Index LA diam:        3.60 cm 1.76 cm/m  RA Area:     14.40 cm LA Vol (A2C):   62.0 ml 30.34 ml/m RA Volume:   28.30 ml  13.85 ml/m LA Vol (A4C):   73.8 ml 36.11 ml/m LA Biplane Vol: 70.2 ml 34.35 ml/m  AORTIC VALVE LVOT Vmax:   75.93 cm/s LVOT Vmean:  50.300 cm/s LVOT VTI:    0.150 m  AORTA Ao Root diam: 3.00 cm  SHUNTS Systemic VTI:  0.15 m Systemic Diam: 2.00 cm Cherlynn Kaiser MD Electronically signed by Cherlynn Kaiser MD Signature Date/Time: 12/17/2019/3:56:52 PM    Final (Updated)    CT Renal Stone Study  Result Date: 12/16/2019 CLINICAL DATA:  Flank pain.  Recent UTI EXAM:  CT ABDOMEN AND PELVIS WITHOUT CONTRAST TECHNIQUE: Multidetector CT imaging of the abdomen and pelvis was performed following the standard protocol without IV contrast. COMPARISON:  None. FINDINGS: Lower chest: Cardiomegaly.  No acute abnormality.  Hepatobiliary: Small layering gallstones within the gallbladder. No focal hepatic abnormality. No biliary ductal dilatation. Pancreas: No focal abnormality or ductal dilatation. Spleen: No focal abnormality.  Normal size. Adrenals/Urinary Tract: Low-density nodule within the left adrenal gland measures 2.5 cm most compatible with adenoma. Small exophytic lesion off the upper pole of the left kidney measures 2 cm, likely cyst. No renal or ureteral stones. No hydronephrosis. Urinary bladder unremarkable. Stomach/Bowel: Few scattered right colonic diverticula. No active diverticulitis. Normal appendix. Stomach and small bowel decompressed, unremarkable. Vascular/Lymphatic: Aortic atherosclerosis. No enlarged abdominal or pelvic lymph nodes. Reproductive: Uterus and adnexa unremarkable.  No mass. Other: No free fluid or free air. Small umbilical hernia containing fat. Musculoskeletal: No acute bony abnormality. IMPRESSION: No renal or ureteral stones.  No hydronephrosis. Cholelithiasis. Aortic atherosclerosis. Umbilical hernia containing fat. Electronically Signed   By: Rolm Baptise M.D.   On: 12/16/2019 15:14        Scheduled Meds: . buPROPion  300 mg Oral Daily  . dabigatran  75 mg Oral BID  . escitalopram  40 mg Oral Daily  . feeding supplement (PRO-STAT SUGAR FREE 64)  30 mL Oral BID  . [START ON 12/19/2019] furosemide  40 mg Intravenous Daily  . levothyroxine  112 mcg Oral Q0600  . lithium carbonate  300 mg Oral Daily  . metoprolol succinate  25 mg Oral BID  . multivitamin with minerals  1 tablet Oral Daily  . OLANZapine  5 mg Oral QHS  . potassium chloride SA  20 mEq Oral Daily  . senna  1 tablet Oral BID  . simvastatin  20 mg Oral Daily   Continuous Infusions:    Time spent: 20 minutes with over 50% of the time coordinating the patient's care    Harold Hedge, DO [facility] Pager [phone]  Call night coverage person covering after 7pm

## 2019-12-19 LAB — BASIC METABOLIC PANEL
Anion gap: 8 (ref 5–15)
BUN: 33 mg/dL — ABNORMAL HIGH (ref 8–23)
CO2: 33 mmol/L — ABNORMAL HIGH (ref 22–32)
Calcium: 9.2 mg/dL (ref 8.9–10.3)
Chloride: 99 mmol/L (ref 98–111)
Creatinine, Ser: 1.84 mg/dL — ABNORMAL HIGH (ref 0.44–1.00)
GFR calc Af Amer: 28 mL/min — ABNORMAL LOW (ref >60)
GFR calc non Af Amer: 24 mL/min — ABNORMAL LOW (ref >60)
Glucose, Bld: 101 mg/dL — ABNORMAL HIGH (ref 70–99)
Potassium: 4 mmol/L (ref 3.5–5.1)
Sodium: 140 mmol/L (ref 135–145)

## 2019-12-19 LAB — CBC
HCT: 49.8 % — ABNORMAL HIGH (ref 36.0–46.0)
Hemoglobin: 14.8 g/dL (ref 12.0–15.0)
MCH: 30.5 pg (ref 26.0–34.0)
MCHC: 29.7 g/dL — ABNORMAL LOW (ref 30.0–36.0)
MCV: 102.5 fL — ABNORMAL HIGH (ref 80.0–100.0)
Platelets: 186 10*3/uL (ref 150–400)
RBC: 4.86 MIL/uL (ref 3.87–5.11)
RDW: 14 % (ref 11.5–15.5)
WBC: 7.2 10*3/uL (ref 4.0–10.5)
nRBC: 0 % (ref 0.0–0.2)

## 2019-12-19 MED ORDER — FUROSEMIDE 20 MG PO TABS: 40.0000 mg | ORAL_TABLET | Freq: Every day | ORAL | 1 refills | Status: DC

## 2019-12-19 MED ORDER — LABETALOL HCL 5 MG/ML IV SOLN
20.0000 mg | INTRAVENOUS | Status: DC | PRN
  Filled 2019-12-19: qty 4

## 2019-12-19 NOTE — TOC Progression Note (Signed)
Transition of Care [facility]) - Progression Note    Patient Details  Name: Tsion Inghram MRN: [id number] Date of Birth: [birth year]  [facility] CM/SW Contact  Joaquin Courts, RN Phone Number: 12/19/2019, 2:23 PM  Clinical Narrative:  HHPT/OT added to home health. [facility] rep made aware.         Expected Discharge Plan: [place] Barriers to Discharge: No Barriers Identified  Expected Discharge Plan and Services Expected Discharge Plan: [place]   Discharge Planning Services: CM Consult Post Acute Care Choice: [place] arrangements for the past 2 months: Single Family Home Expected Discharge Date: 12/19/19                                     Social Determinants of Health (SDOH) Interventions    Readmission Risk Interventions No flowsheet data found.

## 2019-12-19 NOTE — Progress Notes (Signed)
Pt BP was 155/115 paged ON call Argie Ramming, MD who ordered 20 mg Labetalol. BP check before med admistraination was outside the parameters set for drug to be given so held and not given incoming nurse aware.

## 2019-12-19 NOTE — Discharge Summary (Signed)
Physician Discharge Summary  Sonya Kaufman KZS:[id number] DOB: 09-20-1931   PCP: Shon Baton, MD  Admit date: 12/16/2019 Discharge date: 12/19/2019 Length of Stay: 2 days   Code Status: DNR  Admitted From:  Home Discharged to:   [place]: [facility] PT/OT/RN  Equipment/Devices: Home O2  Discharge Condition: Stable  Recommendations for Outpatient Follow-up   1. Follow up with PCP in 1 week 2. Follow up BMP/CBC  3. Lasix increased to 40 mg daily 4. Likely a component of depression leading to her symptoms and needs further outpatient management  Hospital Summary  This is an 86 year old female with past medical history of atrial fibrillation on Pradaxa and Toprol, CKD 4, diastolic heart failure, depression on Wellbutrin, Lexapro, lithium and Zyprexa nightly who [facility] for failure to thrive x1 to 2 weeks.  Patient was treated for diastolic heart failure exacerbation with diuresis and had significant improvement in her symptoms.  Had improvement from 3 L/min nasal cannula to 1 L/min nasal cannula.  Has O2 at home.  Lasix was increased from 30 to 40 mg p.o. daily at discharge and recommended outpatient follow-up with labs after discharge.  She was noted to have been on room air prior to 1 to 2 months ago and has likely had volume overload since that time leading to her chronic hypoxic respiratory failure and O2 requirements at home as of recent.    Evaluated by PT initially recommending SNF however improved and discharged with home health with PT/OT/RN.  It is also likely that her failure to thrive has a component of poorly controlled depression and should be evaluated by her psychiatrist as an outpatient   A & P   Principal Problem:   Failure to thrive in adult Active Problems:   Hypothyroidism   Depression   Atrial fibrillation (HCC)   Acute on chronic renal insufficiency   Acute on chronic diastolic ACC/AHA stage C congestive heart failure  ([place])      1. Failure to thrive,Concern for secondary to suspected diastolic heart failure exacerbation andDepression a. Symptomatically improved with IV diuresis b. Outpatient follow-up for depression 2. Acute on chronic hypoxic respiratory failure, concern for diastolic heart failure exacerbation a. Has been on 2 L/min O2 for the past 1 to 2 months but previously did not require O2. Apparently was recently treated for pneumonia per daughter who is an Therapist, sports in oncology center has required O2 since that time.  Was on 3 L/min in ED on admission and with cardiac wheeze.  Wheezing resolved with diuresis and O2 was titrated to 1 L/min prior to discharge b. BNP 504 c. No clear etiology for her heart failure exacerbation as she is compliant with diet and medications d. Increase Lasix from 30 to 40 mg p.o. daily e. Follow-up BMP 3. Depression a. Reports recent flareup of depression due to sedentary lifestyle, winter season, taking care of disabled child who is 59 years old and exacerbated by Covid pandemic.  Currently without suicidal ideation or plan.  She believes her depression has been affecting her overall mood and may be related to her failure to thrive b. Continue Lexapro, Wellbutrin, Zyprexa nightly and lithium for now and if failure to thrive does not improve with diuresis then consider antidepressant med rec 4. Rate controlled atrial fibrillation a. Continue Toprol XL b. Continue Pradaxa 5. Hypothyroidism a. Continue Synthroid b. Check TSH 6. Increased creatinine with history of CKD 4  1. Increased creatinine due to Lasix, hold Lasix today 2.  Follow-up BMP outpatient with increased diuresis at home     Procedures  . None  Antibiotics   Anti-infectives (From admission, onward)   Start     Dose/Rate Route Frequency Ordered Stop   12/16/19 1330  cefTRIAXone (ROCEPHIN) 1 g in sodium chloride 0.9 % 100 mL IVPB     1 g 200 mL/hr over 30 Minutes Intravenous  Once 12/16/19 1245  12/16/19 1433       Subjective  Patient seen and examined at bedside no acute distress and resting comfortably.  Feels much better today than when she presented and does not wish to increase her antidepressants at this time.  No events overnight.  Tolerating diet. In good spirits and anticipating discharge.  Daughter at bedside states that patient has home health set up.  Denies any chest pain, shortness of breath, fever, nausea, vomiting, urinary or bowel complaints. Otherwise ROS negative    Objective   Discharge Exam: Vitals:   12/19/19 0501 12/19/19 0731  BP: (!) 155/115 (!) 153/89  Pulse: 87 86  Resp: 16   Temp: 97.7 F (36.5 C)   SpO2: 95%    Vitals:   12/18/19 2124 12/19/19 0501 12/19/19 0504 12/19/19 0731  BP: 124/81 (!) 155/115  (!) 153/89  Pulse: 68 87  86  Resp: 20 16    Temp: 97.8 F (36.6 C) 97.7 F (36.5 C)    TempSrc:  Oral    SpO2: 95% 95%    Weight:   90.1 kg   Height:        Physical Exam Vitals and nursing note reviewed. Exam conducted with a chaperone present.  Constitutional:      Appearance: Normal appearance. She is not ill-appearing.  HENT:     Head: Normocephalic and atraumatic.  Eyes:     Conjunctiva/sclera: Conjunctivae normal.  Cardiovascular:     Rate and Rhythm: Normal rate and regular rhythm.  Pulmonary:     Effort: Pulmonary effort is normal.     Breath sounds: Normal breath sounds.  Abdominal:     General: Abdomen is flat.     Palpations: Abdomen is soft.  Musculoskeletal:        General: No swelling or tenderness.  Skin:    Coloration: Skin is not jaundiced or pale.  Neurological:     Mental Status: She is alert and oriented to person, place, and time. Mental status is at baseline.  Psychiatric:        Mood and Affect: Mood normal.        Behavior: Behavior normal.       The results of significant diagnostics from this hospitalization (including imaging, microbiology, ancillary and laboratory) are listed below for  reference.     Microbiology: Recent Results (from the past 240 hour(s))  Blood culture (routine x 2)     Status: None (Preliminary result)   Collection Time: 12/16/19 11:58 AM   Specimen: BLOOD  Result Value Ref Range Status   Specimen Description   Final    BLOOD RIGHT ANTECUBITAL Performed at [facility] [address], [place], [place] [address]    Special Requests   Final    BOTTLES DRAWN AEROBIC AND ANAEROBIC Blood Culture results may not be optimal due to an excessive volume of blood received in culture bottles Performed at [facility] [address] [place], [place] [address]    Culture   Final    NO GROWTH 3 DAYS Performed at [facility], [address] [place], [place] [address]    Report Status PENDING  Incomplete  Urine Culture     Status: None   Collection Time: 12/16/19 11:59 AM   Specimen: In/Out Cath Urine  Result Value Ref Range Status   Specimen Description   Final    IN/OUT CATH URINE Performed at [facility] [address] [place], [place] [address]    Special Requests   Final    NONE Performed at [facility], [place] [address] [place], [place] [address]    Culture   Final    NO GROWTH Performed at [facility], [place] [address], [place], [place] [address]    Report Status 12/17/2019 FINAL  Final  Blood culture (routine x 2)     Status: None (Preliminary result)   Collection Time: 12/16/19 12:03 PM   Specimen: BLOOD RIGHT HAND  Result Value Ref Range Status   Specimen Description   Final    BLOOD RIGHT HAND Performed at [facility] [address], [place], [place] [address]    Special Requests   Final    BOTTLES DRAWN AEROBIC AND ANAEROBIC Blood Culture adequate volume Performed at [facility] [address], [place], [place] [address]    Culture   Final    NO GROWTH 3 DAYS Performed at [facility],  [place] [address], [place], [place] [address]    Report Status PENDING  Incomplete  SARS CORONAVIRUS 2 (TAT 6-24 HRS) Nasopharyngeal Nasopharyngeal Swab     Status: None   Collection Time: 12/16/19  2:37 PM   Specimen: Nasopharyngeal Swab  Result Value Ref Range Status   SARS Coronavirus 2 NEGATIVE NEGATIVE Final    Comment: (NOTE) SARS-CoV-2 target nucleic acids are NOT DETECTED. The SARS-CoV-2 RNA is generally detectable in upper and lower respiratory specimens during the acute phase of infection. Negative results do not preclude SARS-CoV-2 infection, do not rule out co-infections with other pathogens, and should not be used as the sole basis for treatment or other patient management decisions. Negative results must be combined with clinical observations, patient history, and epidemiological information. The expected result is Negative. Fact Sheet for Patients: SugarRoll.be Fact Sheet for Healthcare Providers: https://www.woods-mathews.com/ This test is not yet approved or cleared by the [place] FDA and  has been authorized for detection and/or diagnosis of SARS-CoV-2 by FDA under an Emergency Use Authorization (EUA). This EUA will remain  in effect (meaning this test can be used) for the duration of the COVID-19 declaration under Section 56 4(b)(1) of the Act, 21 U.S.C. section 360bbb-3(b)(1), unless the authorization is terminated or revoked sooner. Performed at [facility], [place] [address] [place], [place] [address]      Labs: BNP (last 3 results) Recent Labs    12/16/19 1159  BNP 366.4*   Basic Metabolic Panel: Recent Labs  Lab 12/16/19 1158 12/17/19 0346 12/17/19 0434 12/18/19 0319 12/18/19 1525 12/19/19 0354  NA 138 140  --  141  --  140  K 4.8 4.2  --  4.0  --  4.0  CL 100 100  --  99  --  99  CO2 29 29  --  31  --  33*  GLUCOSE 102* 124*  --  106*  --  101*  BUN 26* 23  --  25*  --  33*  CREATININE 1.71*  1.67*  --  1.60*  --  1.84*  CALCIUM 9.6 9.4  --  9.4  --  9.2  MG  --   --  2.1  --   --   --   PHOS  --   --   --   --  4.4  --    Liver Function Tests: Recent Labs  Lab 12/16/19 1158  AST 19  ALT 15  ALKPHOS 81  BILITOT 0.9  PROT 6.7  ALBUMIN 3.6   No results for input(s): LIPASE, AMYLASE in the last 168 hours. No results for input(s): AMMONIA in the last 168 hours. CBC: Recent Labs  Lab 12/16/19 1158 12/17/19 0346 12/18/19 0319 12/19/19 0354  WBC 7.1 6.7 6.8 7.2  NEUTROABS 4.8  --   --   --   HGB 14.6 14.7 14.7 14.8  HCT 48.2* 48.6* 47.9* 49.8*  MCV 101.0* 101.0* 100.0 102.5*  PLT 215 186 184 186   Cardiac Enzymes: No results for input(s): CKTOTAL, CKMB, CKMBINDEX, TROPONINI in the last 168 hours. BNP: Invalid input(s): POCBNP CBG: No results for input(s): GLUCAP in the last 168 hours. D-Dimer No results for input(s): DDIMER in the last 72 hours. Hgb A1c Recent Labs    12/16/19 2027  HGBA1C 5.9*   Lipid Profile No results for input(s): CHOL, HDL, LDLCALC, TRIG, CHOLHDL, LDLDIRECT in the last 72 hours. Thyroid function studies No results for input(s): TSH, T4TOTAL, T3FREE, THYROIDAB in the last 72 hours.  Invalid input(s): FREET3 Anemia work up No results for input(s): VITAMINB12, FOLATE, FERRITIN, TIBC, IRON, RETICCTPCT in the last 72 hours. Urinalysis    Component Value Date/Time   COLORURINE YELLOW 12/16/2019 1159   APPEARANCEUR CLEAR 12/16/2019 1159   LABSPEC 1.019 12/16/2019 1159   PHURINE 7.0 12/16/2019 1159   GLUCOSEU NEGATIVE 12/16/2019 1159   GLUCOSEU NEGATIVE 04/06/2013 0820   HGBUR NEGATIVE 12/16/2019 1159   BILIRUBINUR NEGATIVE 12/16/2019 1159   KETONESUR NEGATIVE 12/16/2019 1159   PROTEINUR NEGATIVE 12/16/2019 1159   UROBILINOGEN 1.0 04/06/2013 0820   NITRITE NEGATIVE 12/16/2019 1159   LEUKOCYTESUR NEGATIVE 12/16/2019 1159   Sepsis Labs Invalid input(s): PROCALCITONIN,  WBC,  LACTICIDVEN Microbiology Recent Results (from the  past 240 hour(s))  Blood culture (routine x 2)     Status: None (Preliminary result)   Collection Time: 12/16/19 11:58 AM   Specimen: BLOOD  Result Value Ref Range Status   Specimen Description   Final    BLOOD RIGHT ANTECUBITAL Performed at [facility], [place] [address], [place], [place] [address]    Special Requests   Final    BOTTLES DRAWN AEROBIC AND ANAEROBIC Blood Culture results may not be optimal due to an excessive volume of blood received in culture bottles Performed at [facility] [address] [place], [place] [address]    Culture   Final    NO GROWTH 3 DAYS Performed at [facility], [place] [address] [place], [place] [address]    Report Status PENDING  Incomplete  Urine Culture     Status: None   Collection Time: 12/16/19 11:59 AM   Specimen: In/Out Cath Urine  Result Value Ref Range Status   Specimen Description   Final    IN/OUT CATH URINE Performed at [facility] [address], [place], [place] [address]    Special Requests   Final    NONE Performed at [facility], [place] [address], [place], [place] [address]    Culture   Final    NO  GROWTH Performed at [facility], [place] [address], [place], [place] [address]    Report Status 12/17/2019 FINAL  Final  Blood culture (routine x 2)     Status: None (Preliminary result)   Collection Time: 12/16/19 12:03 PM   Specimen: BLOOD RIGHT HAND  Result Value Ref Range Status   Specimen Description   Final    BLOOD RIGHT HAND Performed at [facility] [address] [place], [place] [address]    Special Requests   Final    BOTTLES DRAWN AEROBIC AND ANAEROBIC Blood Culture adequate volume Performed at [place] [address] [place], [place] [address]    Culture   Final    NO GROWTH 3 DAYS Performed at [facility], [place] [address] [place], [place] [address]    Report Status  PENDING  Incomplete  SARS CORONAVIRUS 2 (TAT 6-24 HRS) Nasopharyngeal Nasopharyngeal Swab     Status: None   Collection Time: 12/16/19  2:37 PM   Specimen: Nasopharyngeal Swab  Result Value Ref Range Status   SARS Coronavirus 2 NEGATIVE NEGATIVE Final    Comment: (NOTE) SARS-CoV-2 target nucleic acids are NOT DETECTED. The SARS-CoV-2 RNA is generally detectable in upper and lower respiratory specimens during the acute phase of infection. Negative results do not preclude SARS-CoV-2 infection, do not rule out co-infections with other pathogens, and should not be used as the sole basis for treatment or other patient management decisions. Negative results must be combined with clinical observations, patient history, and epidemiological information. The expected result is Negative. Fact Sheet for Patients: SugarRoll.be Fact Sheet for Healthcare Providers: https://www.woods-mathews.com/ This test is not yet approved or cleared by the [place] FDA and  has been authorized for detection and/or diagnosis of SARS-CoV-2 by FDA under an Emergency Use Authorization (EUA). This EUA will remain  in effect (meaning this test can be used) for the duration of the COVID-19 declaration under Section 56 4(b)(1) of the Act, 21 U.S.C. section 360bbb-3(b)(1), unless the authorization is terminated or revoked sooner. Performed at [facility], [place] [address] [place], [place] [address]     Discharge Instructions     Discharge Instructions    Diet - low sodium heart healthy   Complete by: As directed    Discharge instructions   Complete by: As directed    You were seen and examined in the hospital for generalized weakness and cared for by a hospitalist.   Upon Discharge:  - increase Lasix dose from 30 to 40 mg daily - get lab work this week to follow up on your kidney function - follow up with your primary care physician and your psychiatrist to  discuss Zyprexa dosing and depression  Request that your primary physician go over all hospital tests and procedures/radiological results at the follow up.   Please get all hospital records sent to your physician by signing a hospital release before you go home.   Read the complete instructions along with all the possible side effects for all the medicines you take and that have been prescribed to you. Take any new medicines after you have completely understood and accept all the possible adverse reactions/side effects.   If you have any questions about your discharge medications or the care you received while you were in the hospital, you can call the unit and asked to speak with the hospitalist on call. Once you are discharged, your primary care physician will handle any  further medical issues. Please note that NO REFILLS for any discharge medications will be authorized, as it is imperative that you return to your primary care physician (or establish a relationship with a primary care physician if you do not have one) for your aftercare needs so that they can reassess your need for medications and monitor your lab values.   Do not drive, operate heavy machinery, perform activities at heights, swimming or participation in water activities or provide baby sitting services if your were admitted for loss of consciousness/seizures or if you are on sedating medications including, but not limited to benzodiazepines, sleep medications, narcotic pain medications, etc., until you have been cleared to do so by a medical doctor.   Do not take more than prescribed medications.   Wear a seat belt while driving.  If you have smoked or chewed Tobacco in the last 2 years please stop smoking; also stop any regular Alcohol and/or any Recreational drug use including marijuana.  If you experience worsening of your admission symptoms or develop shortness of breath, chest pain, suicidal or homicidal thoughts or  experience a life threatening emergency, you must seek medical attention immediately by calling 911 or calling your PCP immediately.   Increase activity slowly   Complete by: As directed      Allergies as of 12/19/2019   No Known Allergies     Medication List    TAKE these medications   acetaminophen 500 MG tablet Commonly known as: TYLENOL Take 500 mg by mouth every 6 (six) hours as needed for moderate pain.   albuterol 108 (90 Base) MCG/ACT inhaler Commonly known as: VENTOLIN HFA Inhale 2 puffs into the lungs every 6 (six) hours as needed for wheezing or shortness of breath.   buPROPion 300 MG 24 hr tablet Commonly known as: WELLBUTRIN XL Take 300 mg by mouth daily.   escitalopram 20 MG tablet Commonly known as: LEXAPRO Take 40 mg by mouth daily.   esomeprazole 20 MG capsule Commonly known as: NEXIUM Take 20 mg by mouth daily at 12 noon.   furosemide 20 MG tablet Commonly known as: LASIX Take 2 tablets (40 mg total) by mouth daily. What changed: See the new instructions.   ipratropium-albuterol 0.5-2.5 (3) MG/3ML Soln Commonly known as: DUONEB Inhale 3 mLs into the lungs 2 (two) times daily as needed (sob/wheezing).   lithium carbonate 300 MG CR tablet Commonly known as: LITHOBID Take 300 mg by mouth daily.   metoprolol succinate 25 MG 24 hr tablet Commonly known as: TOPROL-XL TAKE ONE TABLET BY MOUTH TWICE DAILY   OLANZapine 2.5 MG tablet Commonly known as: ZYPREXA Take 2.5 mg by mouth at bedtime.   potassium chloride SA 20 MEQ tablet Commonly known as: KLOR-CON TAKE ONE TABLET BY MOUTH ONCE DAILY   Pradaxa 75 MG Caps capsule Generic drug: dabigatran TAKE 1 CAPSULE BY MOUTH TWICE DAILY   simvastatin 20 MG tablet Commonly known as: ZOCOR Take 20 mg by mouth daily.   Synthroid 112 MCG tablet Generic drug: levothyroxine Take 1 tablet by mouth daily before breakfast.      Follow-up Information    [facility] Follow up.   Specialty: Home  Health Services Why: agency will provide home health physical therapy, occupational therapy, and nurse.  Contact information: [address] [place] [address] [phone]          No Known Allergies  Time coordinating discharge: Over 30 minutes   SIGNED:   Chauncy Passy  Neysa Bonito, D.O. [facility] Pager: [phone]  12/19/2019, 2:48 PM

## 2019-12-21 LAB — CULTURE, BLOOD (ROUTINE X 2)
Culture: NO GROWTH
Culture: NO GROWTH
Special Requests: ADEQUATE

## 2020-03-10 ENCOUNTER — Telehealth: Payer: Self-pay | Admitting: Internal Medicine

## 2020-03-10 NOTE — Telephone Encounter (Signed)
Scheduled [facility] Palliative visit for 03-16-20 at 1:30.

## 2020-03-16 ENCOUNTER — Other Ambulatory Visit: Payer: Self-pay

## 2020-03-16 ENCOUNTER — Other Ambulatory Visit: Payer: Medicare Other | Admitting: Internal Medicine

## 2020-03-16 ENCOUNTER — Encounter: Payer: Self-pay | Admitting: Internal Medicine

## 2020-03-16 DIAGNOSIS — Z515 Encounter for palliative care: Secondary | ICD-10-CM

## 2020-03-16 DIAGNOSIS — Z7189 Other specified counseling: Secondary | ICD-10-CM

## 2020-03-16 NOTE — Progress Notes (Signed)
June 17th, 2021 Sonya Kaufman Palliative Care Consult Note Telephone: [phone]  Fax: [phone]  PATIENT NAME: Sonya Kaufman DOB: [birth year] MRN: [id number]  [name],  [name]   PRIMARY CARE PROVIDER:   Shon Baton, MD  REFERRING PROVIDER:  Shon Baton, MD [address] [place],  [place] [address]  (Sonya Kaufman) Sonya Kaufman  [phone]. F/U in August.   RESPONSIBLE PARTY: Sonya Kaufman (Daughter) [phone] Sonya Kaufman). Sonya Kaufman ([facility], [phone].  ASSESSMENT / RECOMMENDATIONS:  1. Advance Care Planning: A. Directives: Discussed use of CPR in the event of a cardiopulmonary arrest. Patient, in the presence of her daughter, desires DNR in that circumstance. I completed 2 forms, and uploaded copy into Sonya Kaufman. Recommended placing one copy on the fridge, where EMS would look for it should they be summoned. B. Goals of Care: Recent hospice.evaluation; defers so she can continue with her specialists. She enjoys watching her cats.  2. Cognitive / Functional status: Patient is pleasantly conversant and on topic. Oriented to self and place. Maintaining good eye contact. Able to follow commands. Poor short-term memory (couldn't remember what she had for lunch). Discussed feelings of being blue. Reports no real joy in her life, though enjoys watching her kitties. She is under the care of Sonya. Albertine Kaufman (Sonya Kaufman) who is titrating up patient's Welbutrin over this past week. Patient has f/u in 2 months.  Daughter notes patient can have repetitive speech, worse as evening progresses. Can get angry; gets mad and cusses which is very out of character for her.   Weight on June 4th was 209 lbs.  In Oct of 2020 weight was also 209.  Pt will dip to 199 intermittently. At a height of 5"5" her BMI is 34.78 kg/m2. The family supplement her diet with protein and electrolyte enriched chocolate milk.  Pt uses oxygen at night at 2  liters/min and PRN during the day.  Her daytime oxygen saturations average 92-93 %.   Pt ambulates with a rollator walker.  Sometimes needs 1 person assist to transfer. Stays in recliner most of the day. She is continent of bowel and bladder. Sleeps well at night; has a bell she rings if needs Tylenol etc. Not at risk for wandering.   -I reviewed with patient and daughter chair and sit to stand exercises as well as arm exercises. Goal 5-10 each set up to tid.  Has some back and sacral discomfort thought d/t sitting most of the time on her recliner. We discussed importance of shifting her weight; standing and relieving bottom pressure at least every hour or two  4. Family Supports: Spouse deceased of oral cancer 3 yrs ago. She really misses him. Patient's son Sonya Kaufman is disabled with Cerebral Palsy. He lives in patient's home and has aide service and the care of patient's daughters. Pt's 2 daughters Sonya over to care for both the patient and her son. They alternate shifts of 48 hours. Daughter Sonya Kaufman is a Marine scientist.  The sisters have just hired a woman to work 4 hours on Mon and Wed each week, to allow them to get some outside chores done.   5. Follow up Palliative Care Visit: family will call me on a prn basis, for assist with symptom management or if signs of decline.   I spent 60 minutes providing this consultation from 1:30pm to 2:30pm. More than 50% of the time in this consultation was spent coordinating communication.   HISTORY OF  PRESENT ILLNESS:  Sonya Kaufman is an 86 y.o.  female with  Pro Cal Malnutrition, Chronic dCHF (EF 65-70%) Stage C, CKD IV, A-Fib (pradaxa, Toprol), Cerebrovascular Disease, Venous insufficiency, COPD/Asthmatic Bronchitis, Bradycardia, Hypothyroid, OA, Severe Depression (Wellbutrin, lexapro, lithium, Zyprexa).    3/18-3/21/202: hospitalized dCHF.   Palliative Care was asked to help address goals of care.   CODE STATUS: DNR  PPS: weak 40%  HOSPICE  ELIGIBILITY/DIAGNOSIS: TBD  PAST MEDICAL HISTORY:  Past Medical History:  Diagnosis Date  . Atrial flutter ([facility])   . Benign neoplasm of colon   . Depressive disorder, not elsewhere classified   . Diastolic CHF, chronic ([facility])   . Disorder of bone and cartilage, unspecified   . Diverticulosis of colon (without mention of hemorrhage)   . Osteoarthrosis, unspecified whether generalized or localized, unspecified site   . Other and unspecified hyperlipidemia    EF 60-65% by echo 11/2011  . Overweight(278.02)   . Persistent atrial fibrillation ([facility])   . Renal insufficiency   . Tachycardia-bradycardia ([facility])   . Unspecified hypothyroidism   . Venous insufficiency     SOCIAL HX:  Social History   Tobacco Use  . Smoking status: Never Smoker  . Smokeless tobacco: Never Used  Substance Use Topics  . Alcohol use: No    ALLERGIES: No Known Allergies   PERTINENT MEDICATIONS:  Outpatient Encounter Medications as of 03/16/2020  Medication Sig  . acetaminophen (TYLENOL) 500 MG tablet Take 500 mg by mouth every 6 (six) hours as needed for moderate pain.  Marland Kitchen albuterol (VENTOLIN HFA) 108 (90 Base) MCG/ACT inhaler Inhale 2 puffs into the lungs every 6 (six) hours as needed for wheezing or shortness of breath.   Marland Kitchen buPROPion HCl ER, XL, 450 MG TB24 Take 450 mg by mouth daily. 03/16/2020: Wellbutrin being titrated upwards  Over a week's time (300mg  alternating with 450mg ) towards a dose of 450mg  qd  . escitalopram (LEXAPRO) 20 MG tablet Take 40 mg by mouth daily.   Marland Kitchen esomeprazole (NEXIUM) 20 MG capsule Take 20 mg by mouth daily at 12 noon.  . furosemide (LASIX) 20 MG tablet Take 2 tablets (40 mg total) by mouth daily.  Marland Kitchen ipratropium-albuterol (DUONEB) 0.5-2.5 (3) MG/3ML SOLN Inhale 3 mLs into the lungs 2 (two) times daily as needed (sob/wheezing).   Marland Kitchen lithium carbonate (LITHOBID) 300 MG CR tablet Take 300 mg by mouth daily.  . metoprolol succinate (TOPROL-XL) 25 MG 24 hr tablet TAKE ONE TABLET BY MOUTH  TWICE DAILY  . OLANZapine (ZYPREXA) 2.5 MG tablet Take 2.5 mg by mouth at bedtime.   . potassium chloride SA (K-DUR,KLOR-CON) 20 MEQ tablet TAKE ONE TABLET BY MOUTH ONCE DAILY  . PRADAXA 75 MG CAPS capsule TAKE 1 CAPSULE BY MOUTH TWICE DAILY  . simvastatin (ZOCOR) 20 MG tablet Take 20 mg by mouth daily.  Marland Kitchen SYNTHROID 112 MCG tablet Take 1 tablet by mouth daily before breakfast.   No facility-administered encounter medications on file as of 03/16/2020.    Julianne Handler, NP   [place]

## 2020-12-05 ENCOUNTER — Other Ambulatory Visit: Payer: Medicare Other | Admitting: Nurse Practitioner

## 2020-12-05 ENCOUNTER — Other Ambulatory Visit: Payer: Self-pay

## 2020-12-05 DIAGNOSIS — Z515 Encounter for palliative care: Secondary | ICD-10-CM

## 2020-12-05 DIAGNOSIS — K5900 Constipation, unspecified: Secondary | ICD-10-CM

## 2020-12-05 NOTE — Progress Notes (Addendum)
  [facility] Palliative [facility] Consult Note Telephone: [phone]  Fax: [phone]  PATIENT NAME: Sonya Kaufman [address] [place] [address] [phone] (home)  DOB: 04/08/1931 MRN: [id number]  PRIMARY CARE PROVIDER:    Russo, John, MD,  [address] [place] [address] [phone]  REFERRING PROVIDER:   Russo, John, MD [address] [place],   [address] [phone]  RESPONSIBLE PARTY:   Extended Emergency Contact Information Primary Emergency Contact: Bell,Lisa  [place] Mobile Phone: [phone] Relation: Daughter Secondary Emergency Contact: Bass,Robin  [place] Home Phone: [phone] Work Phone: [phone] Relation: Daughter  I met face to face with patient in home.  ASSESSMENT AND RECOMMENDATIONS:   Advance Care Planning: Daughter Robin present at visit. Goal of care: Goal of care is comfort. Family desire to keep patient at home for as long as possible.  Directives: Patient has a signed DNR form on [facility] Epic EMR. Family unable to locate signed DNR form in home. A new DNR form was signed patient today, recommended placing it on the fridge, where EMS would look for it should they be called. Blank DNR form left with patient daughter to discuss with patient and rest of family, we would review and complete at next the visit.  I spent 30 minutes providing this consultation. More than 50% of the time in this consultation was spent counseling and coordinating communication.  ---------------------------------------------------------------------------------- Symptom Management:  Poor oral intake: family report intake of 25-50%. Patient and family did not think she has lost weight. Recommend starting patient on nutritional supplement like Ensure between meals. Ask and offer patient what she wants to eat. Monitor for evidence of weight loss, like loose fitting cloths.  Frequent  falls: Frequent fall related to weakness. Encourage to use wheel chair for ambulation if walking is becoming difficult, advise 2 persons for transfers as patient falls mostly when transferring from bed to commode.  Constipation: last BM 3 days ago. Patient currently takes Miralax and Senokot as needed. Recommend taking Senokot 8.6mg daily and keep Miralax 17g as needed if no BM in more than 2 days. Pain: ongoing arthritic pain in joints. Tylenol 650mg as needed at bedtime provides relief  And promote sleeps. Recommend scheduling Tylenol 650mg daily at bedtime. Visual Hallucination: Hallucination in the last one month is ongoing. Patient sees ants and spiders in home, symptoms not distressing to patient. No report of fever, chills, or headache. Family not interested in Neurology consult or any diagnostics as they do not desire aggressive treatment. Family report patient more forgetful and now has difficulty recognizing family members. Patient followed by Psych Dr. Steiner for depression, she is on Zyprexa, Lithium and Lexapro. Follow up with psych as scheduled, recommended supportive care, and redirection as indicated. Consider earlier evaluation by Psych if symptoms becomes bothersome.  Follow up Palliative Care Visit: Palliative care will continue to follow for complex decision making and symptom management. Return in about 4 weeks or prn.  Family /Caregiver/Community Supports: Patient lives at home with her disabled son. She has paid caregiver 4 days a week, from 10am-2pm and 12:30am-3:30pm 2 days a week.   Cognitive / Functional decline: Patient awake and alert. She is verbal, mild confusion noted She is continent of bowel and bladder. Ambulates with one person standby assist, unable to self transfer, unable turn in bed independently.   CHIEF COMPLAINT: constipation  History obtained from review of EMR and discussion with patient and family. Records reviewed and summarized   bellow.  HISTORY OF  PRESENT ILLNESS:  Sonya Kaufman is an 87 y.o.  female with  Pro Cal Malnutrition, Chronic dCHF (EF 65-70%, CKD IV, A-Fib (pradaxa, Toprol), Cerebrovascular Disease, Venous insufficiency, COPD/Asthmatic Bronchitis on oxygen supplementation as needed, Bradycardia, Hypothyroid, OA, Severe Depression (Wellbutrin, lexapro, lithium, Zyprexa). Palliative Care was asked to help address goals of care and symptoms management.  CODE STATUS: DNR  PPS: 40%  HOSPICE ELIGIBILITY/DIAGNOSIS: TBD  ROS Constitutional: No report of fever, chills EYES: denies acute vision changes ENMT: denies dysphagia Cardiovascular: denies chest pain, denies palpitation Pulmonary: denies cough, denies increased SOB Abdomen: endorses poor appetite, endorsed  constipation, denies incontinence of bowel GU: denies dysuria, endorsed occassional incontinence of urine MSK:  endorses ROM limitations, frequent falls reported Skin: denies rashes, report pressure area on tip of left great toe Neurological: endorses weakness, denies pain, denies insomnia Psych: Endorses mostly positive mood Heme/lymph/immuno: denies bruises, abnormal bleeding   Physical Exam: Vital signs: BP 120/68, P 75, RR 16, 94% on room air Current and past weights: no current weight reported, BMI 34.78 on 03/16/2020 General: frail appearing, cooperative, sitting in recliner in NAD EYES: anicteric sclera, lids intact, no discharge  ENMT: mildly hard of hearing, oral mucous membranes moist CV:  no LE edema Pulmonary: no increased work of breathing, no cough, no audible wheezes, room air Abdomen: no ascites GU: deferred MSK: moderate sarcopenia, no contractures of LE, ambulatory Skin: warm and dry, no rashes or wounds on visible skin Neuro: Generalized weakness, A and O x 1 Psych: non-anxious affect today Hem/lymph/immuno: no widespread bruising   PAST MEDICAL HISTORY:  Past Medical History:  Diagnosis Date  . Atrial flutter ([facility])   . Benign neoplasm of  colon   . Depressive disorder, not elsewhere classified   . Diastolic CHF, chronic ([facility])   . Disorder of bone and cartilage, unspecified   . Diverticulosis of colon (without mention of hemorrhage)   . Osteoarthrosis, unspecified whether generalized or localized, unspecified site   . Other and unspecified hyperlipidemia    EF 60-65% by echo 11/2011  . Overweight(278.02)   . Persistent atrial fibrillation ([facility])   . Renal insufficiency   . Tachycardia-bradycardia ([facility])   . Unspecified hypothyroidism   . Venous insufficiency     SOCIAL HX:  Social History   Tobacco Use  . Smoking status: Never Smoker  . Smokeless tobacco: Never Used  Substance Use Topics  . Alcohol use: No   FAMILY HX:  Family History  Problem Relation Age of Onset  . Heart failure Mother   . Arthritis Mother   . Heart disease Father   . Heart disease Other     ALLERGIES: No Known Allergies   PERTINENT MEDICATIONS:  Outpatient Encounter Medications as of 12/05/2020  Medication Sig  . acetaminophen (TYLENOL) 500 MG tablet Take 500 mg by mouth every 6 (six) hours as needed for moderate pain.  . albuterol (VENTOLIN HFA) 108 (90 Base) MCG/ACT inhaler Inhale 2 puffs into the lungs every 6 (six) hours as needed for wheezing or shortness of breath.   . buPROPion HCl ER, XL, 450 MG TB24 Take 450 mg by mouth daily. 03/16/2020: Wellbutrin being titrated upwards  Over a week's time (300mg alternating with 450mg) towards a dose of 450mg qd  . escitalopram (LEXAPRO) 20 MG tablet Take 40 mg by mouth daily.   . esomeprazole (NEXIUM) 20 MG capsule Take 20 mg by mouth daily at 12 noon.  . furosemide (LASIX) 20 MG tablet Take 2   tablets (40 mg total) by mouth daily.  . ipratropium-albuterol (DUONEB) 0.5-2.5 (3) MG/3ML SOLN Inhale 3 mLs into the lungs 2 (two) times daily as needed (sob/wheezing).   . lithium carbonate (LITHOBID) 300 MG CR tablet Take 300 mg by mouth daily.  . metoprolol succinate (TOPROL-XL) 25 MG 24 hr tablet TAKE  ONE TABLET BY MOUTH TWICE DAILY  . OLANZapine (ZYPREXA) 2.5 MG tablet Take 2.5 mg by mouth at bedtime.   . potassium chloride SA (K-DUR,KLOR-CON) 20 MEQ tablet TAKE ONE TABLET BY MOUTH ONCE DAILY  . PRADAXA 75 MG CAPS capsule TAKE 1 CAPSULE BY MOUTH TWICE DAILY  . simvastatin (ZOCOR) 20 MG tablet Take 20 mg by mouth daily.  . SYNTHROID 112 MCG tablet Take 1 tablet by mouth daily before breakfast.   No facility-administered encounter medications on file as of 12/05/2020.    Thank you for the opportunity to participate in the care of Ms. Keyri Elliot. The palliative care team will continue to follow. Please call our office at [phone] if we can be of additional assistance.  Queeneth Mbemena, DNP, AGPCNP-BC   

## 2020-12-15 ENCOUNTER — Other Ambulatory Visit: Payer: Self-pay

## 2020-12-15 ENCOUNTER — Encounter (HOSPITAL_COMMUNITY): Payer: Self-pay | Admitting: *Deleted

## 2020-12-15 ENCOUNTER — Emergency Department (HOSPITAL_COMMUNITY): Payer: Medicare Other

## 2020-12-15 ENCOUNTER — Inpatient Hospital Stay (HOSPITAL_COMMUNITY)
Admission: EM | Admit: 2020-12-15 | Discharge: 2020-12-19 | DRG: 640 | Disposition: A | Discharge status: Skilled Nursing Facility | Payer: Medicare Other | Attending: Internal Medicine | Admitting: Internal Medicine

## 2020-12-15 DIAGNOSIS — E785 Hyperlipidemia, unspecified: Secondary | ICD-10-CM | POA: Diagnosis present

## 2020-12-15 DIAGNOSIS — F32A Depression, unspecified: Secondary | ICD-10-CM | POA: Diagnosis present

## 2020-12-15 DIAGNOSIS — R410 Disorientation, unspecified: Secondary | ICD-10-CM

## 2020-12-15 DIAGNOSIS — G4733 Obstructive sleep apnea (adult) (pediatric): Secondary | ICD-10-CM | POA: Diagnosis present

## 2020-12-15 DIAGNOSIS — Z20822 Contact with and (suspected) exposure to covid-19: Secondary | ICD-10-CM | POA: Diagnosis present

## 2020-12-15 DIAGNOSIS — M1990 Unspecified osteoarthritis, unspecified site: Secondary | ICD-10-CM | POA: Diagnosis present

## 2020-12-15 DIAGNOSIS — I4891 Unspecified atrial fibrillation: Secondary | ICD-10-CM | POA: Diagnosis present

## 2020-12-15 DIAGNOSIS — Z8673 Personal history of transient ischemic attack (TIA), and cerebral infarction without residual deficits: Secondary | ICD-10-CM

## 2020-12-15 DIAGNOSIS — K5730 Diverticulosis of large intestine without perforation or abscess without bleeding: Secondary | ICD-10-CM | POA: Diagnosis present

## 2020-12-15 DIAGNOSIS — R627 Adult failure to thrive: Secondary | ICD-10-CM | POA: Diagnosis present

## 2020-12-15 DIAGNOSIS — M47819 Spondylosis without myelopathy or radiculopathy, site unspecified: Secondary | ICD-10-CM | POA: Diagnosis present

## 2020-12-15 DIAGNOSIS — Z66 Do not resuscitate: Secondary | ICD-10-CM | POA: Diagnosis present

## 2020-12-15 DIAGNOSIS — N184 Chronic kidney disease, stage 4 (severe): Secondary | ICD-10-CM | POA: Diagnosis present

## 2020-12-15 DIAGNOSIS — I4821 Permanent atrial fibrillation: Secondary | ICD-10-CM | POA: Diagnosis present

## 2020-12-15 DIAGNOSIS — I4892 Unspecified atrial flutter: Secondary | ICD-10-CM | POA: Diagnosis present

## 2020-12-15 DIAGNOSIS — Z8249 Family history of ischemic heart disease and other diseases of the circulatory system: Secondary | ICD-10-CM

## 2020-12-15 DIAGNOSIS — Z6831 Body mass index (BMI) 31.0-31.9, adult: Secondary | ICD-10-CM

## 2020-12-15 DIAGNOSIS — E860 Dehydration: Principal | ICD-10-CM | POA: Diagnosis present

## 2020-12-15 DIAGNOSIS — G9341 Metabolic encephalopathy: Secondary | ICD-10-CM | POA: Diagnosis present

## 2020-12-15 DIAGNOSIS — Z79899 Other long term (current) drug therapy: Secondary | ICD-10-CM

## 2020-12-15 DIAGNOSIS — F05 Delirium due to known physiological condition: Secondary | ICD-10-CM | POA: Diagnosis present

## 2020-12-15 DIAGNOSIS — I5032 Chronic diastolic (congestive) heart failure: Secondary | ICD-10-CM | POA: Diagnosis present

## 2020-12-15 DIAGNOSIS — J449 Chronic obstructive pulmonary disease, unspecified: Secondary | ICD-10-CM | POA: Diagnosis present

## 2020-12-15 DIAGNOSIS — Z8261 Family history of arthritis: Secondary | ICD-10-CM

## 2020-12-15 DIAGNOSIS — R4182 Altered mental status, unspecified: Secondary | ICD-10-CM | POA: Diagnosis not present

## 2020-12-15 DIAGNOSIS — E039 Hypothyroidism, unspecified: Secondary | ICD-10-CM | POA: Diagnosis present

## 2020-12-15 DIAGNOSIS — Z79890 Hormone replacement therapy (postmenopausal): Secondary | ICD-10-CM

## 2020-12-15 DIAGNOSIS — R0600 Dyspnea, unspecified: Secondary | ICD-10-CM

## 2020-12-15 DIAGNOSIS — E538 Deficiency of other specified B group vitamins: Secondary | ICD-10-CM | POA: Diagnosis present

## 2020-12-15 DIAGNOSIS — I679 Cerebrovascular disease, unspecified: Secondary | ICD-10-CM | POA: Diagnosis present

## 2020-12-15 DIAGNOSIS — R0902 Hypoxemia: Secondary | ICD-10-CM | POA: Diagnosis not present

## 2020-12-15 LAB — CBC WITH DIFFERENTIAL/PLATELET
Abs Immature Granulocytes: 0.02 10*3/uL (ref 0.00–0.07)
Basophils Absolute: 0 10*3/uL (ref 0.0–0.1)
Basophils Relative: 0 %
Eosinophils Absolute: 0.1 10*3/uL (ref 0.0–0.5)
Eosinophils Relative: 2 %
HCT: 50.3 % — ABNORMAL HIGH (ref 36.0–46.0)
Hemoglobin: 15.3 g/dL — ABNORMAL HIGH (ref 12.0–15.0)
Immature Granulocytes: 0 %
Lymphocytes Relative: 26 %
Lymphs Abs: 1.6 10*3/uL (ref 0.7–4.0)
MCH: 31.2 pg (ref 26.0–34.0)
MCHC: 30.4 g/dL (ref 30.0–36.0)
MCV: 102.4 fL — ABNORMAL HIGH (ref 80.0–100.0)
Monocytes Absolute: 0.5 10*3/uL (ref 0.1–1.0)
Monocytes Relative: 8 %
Neutro Abs: 3.7 10*3/uL (ref 1.7–7.7)
Neutrophils Relative %: 64 %
Platelets: 184 10*3/uL (ref 150–400)
RBC: 4.91 MIL/uL (ref 3.87–5.11)
RDW: 14.3 % (ref 11.5–15.5)
WBC: 5.9 10*3/uL (ref 4.0–10.5)
nRBC: 0 % (ref 0.0–0.2)

## 2020-12-15 LAB — COMPREHENSIVE METABOLIC PANEL
ALT: 13 U/L (ref 0–44)
AST: 17 U/L (ref 15–41)
Albumin: 3.5 g/dL (ref 3.5–5.0)
Alkaline Phosphatase: 79 U/L (ref 38–126)
Anion gap: 8 (ref 5–15)
BUN: 26 mg/dL — ABNORMAL HIGH (ref 8–23)
CO2: 28 mmol/L (ref 22–32)
Calcium: 9 mg/dL (ref 8.9–10.3)
Chloride: 105 mmol/L (ref 98–111)
Creatinine, Ser: 1.43 mg/dL — ABNORMAL HIGH (ref 0.44–1.00)
GFR, Estimated: 35 mL/min — ABNORMAL LOW (ref >60)
Glucose, Bld: 97 mg/dL (ref 70–99)
Potassium: 4.5 mmol/L (ref 3.5–5.1)
Sodium: 141 mmol/L (ref 135–145)
Total Bilirubin: 0.3 mg/dL (ref 0.3–1.2)
Total Protein: 6.5 g/dL (ref 6.5–8.1)

## 2020-12-15 LAB — TSH: TSH: 1.386 u[IU]/mL (ref 0.350–4.500)

## 2020-12-15 LAB — URINALYSIS, ROUTINE W REFLEX MICROSCOPIC
Bilirubin Urine: NEGATIVE
Glucose, UA: NEGATIVE mg/dL
Hgb urine dipstick: NEGATIVE
Ketones, ur: NEGATIVE mg/dL
Leukocytes,Ua: NEGATIVE
Nitrite: NEGATIVE
Protein, ur: NEGATIVE mg/dL
Specific Gravity, Urine: 1.013 (ref 1.005–1.030)
pH: 7 (ref 5.0–8.0)

## 2020-12-15 LAB — LITHIUM LEVEL: Lithium Lvl: 0.71 mmol/L (ref 0.60–1.20)

## 2020-12-15 LAB — CK: Total CK: 29 U/L — ABNORMAL LOW (ref 38–234)

## 2020-12-15 LAB — VITAMIN B12: Vitamin B-12: 213 pg/mL (ref 180–914)

## 2020-12-15 LAB — AMMONIA: Ammonia: 21 umol/L (ref 9–35)

## 2020-12-15 MED ORDER — BUPROPION HCL ER (XL) 300 MG PO TB24
450.0000 mg | ORAL_TABLET | Freq: Every day | ORAL | Status: DC
  Administered 2020-12-16 – 2020-12-19 (×4): 450 mg via ORAL
  Filled 2020-12-15 (×4): qty 1

## 2020-12-15 MED ORDER — OLANZAPINE 5 MG PO TABS
5.0000 mg | ORAL_TABLET | Freq: Every day | ORAL | Status: DC
Start: 2020-12-15 — End: 2020-12-19
  Administered 2020-12-15 – 2020-12-18 (×4): 5 mg via ORAL
  Filled 2020-12-15 (×4): qty 1

## 2020-12-15 MED ORDER — THIAMINE HCL 100 MG/ML IJ SOLN
100.0000 mg | Freq: Every day | INTRAMUSCULAR | Status: DC
  Administered 2020-12-15 – 2020-12-16 (×2): 100 mg via INTRAVENOUS
  Filled 2020-12-15 (×2): qty 2

## 2020-12-15 MED ORDER — SIMVASTATIN 20 MG PO TABS
20.0000 mg | ORAL_TABLET | Freq: Every day | ORAL | Status: DC
  Administered 2020-12-15 – 2020-12-19 (×5): 20 mg via ORAL
  Filled 2020-12-15 (×5): qty 1

## 2020-12-15 MED ORDER — ACETAMINOPHEN 650 MG RE SUPP: 650.0000 mg | Freq: Four times a day (QID) | RECTAL | Status: DC | PRN

## 2020-12-15 MED ORDER — PANTOPRAZOLE SODIUM 40 MG PO TBEC
40.0000 mg | DELAYED_RELEASE_TABLET | Freq: Every day | ORAL | Status: DC
  Administered 2020-12-16 – 2020-12-19 (×4): 40 mg via ORAL
  Filled 2020-12-15 (×4): qty 1

## 2020-12-15 MED ORDER — SODIUM CHLORIDE 0.9 % IV SOLN: INTRAVENOUS | Status: DC

## 2020-12-15 MED ORDER — IPRATROPIUM-ALBUTEROL 0.5-2.5 (3) MG/3ML IN SOLN
3.0000 mL | Freq: Every day | RESPIRATORY_TRACT | Status: DC
  Administered 2020-12-16 – 2020-12-18 (×3): 3 mL via RESPIRATORY_TRACT
  Filled 2020-12-15 (×3): qty 3

## 2020-12-15 MED ORDER — LITHIUM CARBONATE ER 300 MG PO TBCR
300.0000 mg | EXTENDED_RELEASE_TABLET | Freq: Every day | ORAL | Status: DC
  Administered 2020-12-15 – 2020-12-19 (×5): 300 mg via ORAL
  Filled 2020-12-15 (×6): qty 1

## 2020-12-15 MED ORDER — LEVOTHYROXINE SODIUM 112 MCG PO TABS
112.0000 ug | ORAL_TABLET | Freq: Every day | ORAL | Status: DC
  Administered 2020-12-16 – 2020-12-19 (×4): 112 ug via ORAL
  Filled 2020-12-15 (×4): qty 1

## 2020-12-15 MED ORDER — ACETAMINOPHEN 325 MG PO TABS
650.0000 mg | ORAL_TABLET | Freq: Four times a day (QID) | ORAL | Status: DC | PRN
  Administered 2020-12-15 – 2020-12-18 (×5): 650 mg via ORAL
  Filled 2020-12-15 (×5): qty 2

## 2020-12-15 MED ORDER — DABIGATRAN ETEXILATE MESYLATE 75 MG PO CAPS
75.0000 mg | ORAL_CAPSULE | Freq: Two times a day (BID) | ORAL | Status: DC
Start: 2020-12-15 — End: 2020-12-19
  Administered 2020-12-15 – 2020-12-19 (×8): 75 mg via ORAL
  Filled 2020-12-15 (×10): qty 1

## 2020-12-15 MED ORDER — ESCITALOPRAM OXALATE 20 MG PO TABS
40.0000 mg | ORAL_TABLET | Freq: Every day | ORAL | Status: DC
  Administered 2020-12-16 – 2020-12-19 (×4): 40 mg via ORAL
  Filled 2020-12-15 (×4): qty 2

## 2020-12-15 MED ORDER — FUROSEMIDE 40 MG PO TABS: 20.0000 mg | ORAL_TABLET | Freq: Every day | ORAL | Status: DC

## 2020-12-15 MED ORDER — METOPROLOL TARTRATE 25 MG PO TABS
25.0000 mg | ORAL_TABLET | Freq: Two times a day (BID) | ORAL | Status: DC
  Administered 2020-12-15 – 2020-12-19 (×8): 25 mg via ORAL
  Filled 2020-12-15 (×8): qty 1

## 2020-12-15 NOTE — H&P (Signed)
History and Physical    Sonya Kaufman YBO:[id number] DOB: 1930-11-10 DOA: 12/15/2020  PCP: Shon Baton, MD   Patient coming from: Home  Chief Complaint  Patient presents with  . Altered Mental Status      HPI: Sonya Kaufman is a 87 y.o. female with medical history significant for chronic diastolic CHF EF 65 to 27%, CKD stage IV baseline creatinine 1.6-1.8, A. fib/a flutter on Pradaxa, Toprol, history of CVA, venous insufficiency, COPD/asthmatic bronchitis on oxygen as needed, hypothyroidism, OSA, severe depression on Wellbutrin Lexapro lithium and Zyprexa brought to the ED for evaluation of altered mental status difficulty with ambulation. Patient lives with her son who is disabled w/ cerebral palsy and also has a caregiver at home.  Patient has been having progressive change in mental status for past 1 to 2 weeks feeling unwell progressively declining with difficulty ambulation worsening confusion, poor oral intake, frequent semisolid bowel movement about 4 times a day incontinent, but has bladder control, family also reporting she has been hallucinating seeing ants and spider. At baseline normal able to walk with a walker but now not able to stand without falling backwards. No report of nausea, vomiting, fever, chills. reviewed epic noted-patient was seen by [facility] for goals of care discussion on 12/05/2020 where she was noted to be mildly confused but alert awake, and also similar hallucination.  In the ED blood pressure was stable, underwent further work-up CT head age-related changes CT spine DJD, chest x-ray evidence of pneumonia, UA with negative nitrite and leuk esterase, CBC BMP shows baseline CKD, hemoconcentration with elevated hemoglobin. Daughter reporting that they have unable to take care of her at home MRI brain was ordered and pending, COVID-19 pending Admission was requested for further work-up.  ED Course:  Vitals:   12/15/20 1352 12/15/20 1356 12/15/20  1545 12/15/20 1733  BP: 131/83  (!) 120/97 137/79  Pulse: 73  (!) 106 87  Resp: 20  20 (!) 28  Temp: (!) 97.5 F (36.4 C)   97.9 F (36.6 C)  TempSrc: Oral   Oral  SpO2: 98%  91% 93%  Height:  5\' 7"  (1.702 m)       Review of Systems: All systems were reviewed and were negative except as mentioned in HPI above. Negative for fever Negative for chest pain Negative for shortness of breath  Past Medical History:  Diagnosis Date  . Atrial flutter ([place])   . Benign neoplasm of colon   . Depressive disorder, not elsewhere classified   . Diastolic CHF, chronic ([place])   . Disorder of bone and cartilage, unspecified   . Diverticulosis of colon (without mention of hemorrhage)   . Osteoarthrosis, unspecified whether generalized or localized, unspecified site   . Other and unspecified hyperlipidemia    EF 60-65% by echo 11/2011  . Overweight(278.02)   . Persistent atrial fibrillation ([place])   . Renal insufficiency   . Tachycardia-bradycardia ([place])   . Unspecified hypothyroidism   . Venous insufficiency     Past Surgical History:  Procedure Laterality Date  . TONSILLECTOMY       reports that she has never smoked. She has never used smokeless tobacco. She reports that she does not drink alcohol and does not use drugs.  No Known Allergies  Family History  Problem Relation Age of Onset  . Heart failure Mother   . Arthritis Mother   . Heart disease Father   . Heart disease Other      Prior to Admission  medications   Medication Sig Start Date End Date Taking? Authorizing Provider  acetaminophen (TYLENOL) 500 MG tablet Take 1,000 mg by mouth every 6 (six) hours as needed for moderate pain.   Yes [provider]  buPROPion (WELLBUTRIN XL) 150 MG 24 hr tablet Take 450 mg by mouth daily. 12/11/20  Yes [provider]  escitalopram (LEXAPRO) 20 MG tablet Take 40 mg by mouth daily.   Yes [provider]  esomeprazole (NEXIUM) 20 MG capsule Take 20 mg by mouth  daily at 12 noon.   Yes [provider]  furosemide (LASIX) 20 MG tablet Take 2 tablets (40 mg total) by mouth daily. Patient taking differently: Take 20 mg by mouth daily. 12/19/19 03/16/20 Yes Harold Hedge, MD  ipratropium-albuterol (DUONEB) 0.5-2.5 (3) MG/3ML SOLN Inhale 3 mLs into the lungs at bedtime. 10/29/19  Yes [provider]  lithium carbonate (LITHOBID) 300 MG CR tablet Take 300 mg by mouth daily.   Yes [provider]  metoprolol tartrate (LOPRESSOR) 50 MG tablet Take 25 mg by mouth 2 (two) times daily. 09/18/20  Yes [provider]  OLANZapine (ZYPREXA) 5 MG tablet Take 5 mg by mouth at bedtime.   Yes [provider]  potassium chloride SA (K-DUR,KLOR-CON) 20 MEQ tablet TAKE ONE TABLET BY MOUTH ONCE DAILY Patient taking differently: Take 20 mEq by mouth daily. 03/10/18  Yes Allred, Jeneen Rinks, MD  PRADAXA 75 MG CAPS capsule TAKE 1 CAPSULE BY MOUTH TWICE DAILY Patient taking differently: Take 75 mg by mouth 2 (two) times daily. 01/13/18  Yes Allred, Jeneen Rinks, MD  simvastatin (ZOCOR) 20 MG tablet Take 20 mg by mouth daily.   Yes [provider]  SYNTHROID 112 MCG tablet Take 112 tablets by mouth daily before breakfast. 03/23/15  Yes [provider]  metoprolol succinate (TOPROL-XL) 25 MG 24 hr tablet TAKE ONE TABLET BY MOUTH TWICE DAILY Patient not taking: Reported on 12/15/2020 10/07/17   Thompson Grayer, MD    Physical Exam: Vitals:   12/15/20 1352 12/15/20 1356 12/15/20 1545 12/15/20 1733  BP: 131/83  (!) 120/97 137/79  Pulse: 73  (!) 106 87  Resp: 20  20 (!) 28  Temp: (!) 97.5 F (36.4 C)   97.9 F (36.6 C)  TempSrc: Oral   Oral  SpO2: 98%  91% 93%  Height:  5\' 7"  (1.702 m)      General exam: AAO to people, self her date of birth knows that she is in the hospital appears very weak frail, obese. HEENT:Oral mucosa DRY, Ear/Nose WNL grossly, dentition normal. Respiratory system: bilaterally clear,no wheezing or crackles,no  use of accessory muscle Cardiovascular system: S1 & S2 +, No JVD,. Gastrointestinal system: Abdomen soft, NT,ND, BS+ Nervous System:Alert, awake, moving extremities all 4 well but grossly weak, nonfocal, sensation intact, no neck stiffness no nystagmus double vision dysphagia Extremities: No edema, distal peripheral pulses palpable.  Skin: No rashes,no icterus. MSK: Normal muscle bulk,tone, power   Labs on Admission: I have personally reviewed following labs and imaging studies  CBC: Recent Labs  Lab 12/15/20 1410  WBC 5.9  NEUTROABS 3.7  HGB 15.3*  HCT 50.3*  MCV 102.4*  PLT 993   Basic Metabolic Panel: Recent Labs  Lab 12/15/20 1410  NA 141  K 4.5  CL 105  CO2 28  GLUCOSE 97  BUN 26*  CREATININE 1.43*  CALCIUM 9.0   GFR: CrCl cannot be calculated (Unknown ideal weight.). Liver Function Tests: Recent Labs  Lab 12/15/20  1410  AST 17  ALT 13  ALKPHOS 79  BILITOT 0.3  PROT 6.5  ALBUMIN 3.5   No results for input(s): LIPASE, AMYLASE in the last 168 hours. No results for input(s): AMMONIA in the last 168 hours. Coagulation Profile: No results for input(s): INR, PROTIME in the last 168 hours. Cardiac Enzymes: Recent Labs  Lab 12/15/20 1410  CKTOTAL 29*   BNP (last 3 results) No results for input(s): PROBNP in the last 8760 hours. HbA1C: No results for input(s): HGBA1C in the last 72 hours. CBG: No results for input(s): GLUCAP in the last 168 hours. Lipid Profile: No results for input(s): CHOL, HDL, LDLCALC, TRIG, CHOLHDL, LDLDIRECT in the last 72 hours. Thyroid Function Tests: No results for input(s): TSH, T4TOTAL, FREET4, T3FREE, THYROIDAB in the last 72 hours. Anemia Panel: No results for input(s): VITAMINB12, FOLATE, FERRITIN, TIBC, IRON, RETICCTPCT in the last 72 hours. Urine analysis:    Component Value Date/Time   COLORURINE YELLOW 12/15/2020 1555   APPEARANCEUR CLEAR 12/15/2020 1555   LABSPEC 1.013 12/15/2020 1555   PHURINE 7.0 12/15/2020  1555   GLUCOSEU NEGATIVE 12/15/2020 1555   GLUCOSEU NEGATIVE 04/06/2013 0820   HGBUR NEGATIVE 12/15/2020 1555   BILIRUBINUR NEGATIVE 12/15/2020 [place] 12/15/2020 1555   PROTEINUR NEGATIVE 12/15/2020 1555   UROBILINOGEN 1.0 04/06/2013 0820   NITRITE NEGATIVE 12/15/2020 1555   LEUKOCYTESUR NEGATIVE 12/15/2020 1555    Radiological Exams on Admission: CT Head Wo Contrast  Result Date: 12/15/2020 CLINICAL DATA:  Altered mental status EXAM: CT HEAD WITHOUT CONTRAST CT CERVICAL SPINE WITHOUT CONTRAST TECHNIQUE: Multidetector CT imaging of the head and cervical spine was performed following the standard protocol without intravenous contrast. Multiplanar CT image reconstructions of the cervical spine were also generated. COMPARISON:  Head CT December 16, 2019. FINDINGS: CT HEAD FINDINGS Brain: No evidence of acute large vascular territory infarction, hemorrhage, hydrocephalus, extra-axial collection or mass lesion/mass effect. Similar mild burden chronic microvascular ischemic white matter disease. Mild age related global parenchymal volume loss with ex vacuo dilatation of ventricular system, stable. Vascular: No hyperdense vessel. Atherosclerotic calcifications of the intracranial portions of the internal carotid and vertebral arteries. Skull: Benign hyperostosis frontalis. Negative for fracture or focal lesion. Sinuses/Orbits: Paranasal sinuses are predominantly clear. Scattered opacification of the mastoid air cells. Orbits are unremarkable. Other: None CT CERVICAL SPINE FINDINGS Alignment: There is some reversal of the normal cervical lordosis. Grade 1 degenerative anterolisthesis of C4 on C5. Skull base and vertebrae: No acute fracture. No primary bone lesion or focal pathologic process. Soft tissues and spinal canal: No prevertebral fluid or swelling. No visible canal hematoma. Disc levels: Moderate multilevel degenerative changes spine with disc space narrowing osteophytosis and  uncovertebral/facet hypertrophy. Upper chest: Evaluation degraded by respiratory motion, no overt abnormality. Other: None IMPRESSION: 1. No acute intracranial abnormality. 2. Similar mild age related global parenchymal volume loss and mild burden chronic microvascular ischemic white matter disease. 3. No evidence of acute fracture or traumatic malalignment of the cervical spine. 4. Moderate multilevel degenerative changes of the cervical spine. Electronically Signed   By: Dahlia Bailiff MD   On: 12/15/2020 16:37   CT Cervical Spine Wo Contrast  Result Date: 12/15/2020 CLINICAL DATA:  Altered mental status EXAM: CT HEAD WITHOUT CONTRAST CT CERVICAL SPINE WITHOUT CONTRAST TECHNIQUE: Multidetector CT imaging of the head and cervical spine was performed following the standard protocol without intravenous contrast. Multiplanar CT image reconstructions of the cervical spine were also generated. COMPARISON:  Head CT  December 16, 2019. FINDINGS: CT HEAD FINDINGS Brain: No evidence of acute large vascular territory infarction, hemorrhage, hydrocephalus, extra-axial collection or mass lesion/mass effect. Similar mild burden chronic microvascular ischemic white matter disease. Mild age related global parenchymal volume loss with ex vacuo dilatation of ventricular system, stable. Vascular: No hyperdense vessel. Atherosclerotic calcifications of the intracranial portions of the internal carotid and vertebral arteries. Skull: Benign hyperostosis frontalis. Negative for fracture or focal lesion. Sinuses/Orbits: Paranasal sinuses are predominantly clear. Scattered opacification of the mastoid air cells. Orbits are unremarkable. Other: None CT CERVICAL SPINE FINDINGS Alignment: There is some reversal of the normal cervical lordosis. Grade 1 degenerative anterolisthesis of C4 on C5. Skull base and vertebrae: No acute fracture. No primary bone lesion or focal pathologic process. Soft tissues and spinal canal: No prevertebral fluid  or swelling. No visible canal hematoma. Disc levels: Moderate multilevel degenerative changes spine with disc space narrowing osteophytosis and uncovertebral/facet hypertrophy. Upper chest: Evaluation degraded by respiratory motion, no overt abnormality. Other: None IMPRESSION: 1. No acute intracranial abnormality. 2. Similar mild age related global parenchymal volume loss and mild burden chronic microvascular ischemic white matter disease. 3. No evidence of acute fracture or traumatic malalignment of the cervical spine. 4. Moderate multilevel degenerative changes of the cervical spine. Electronically Signed   By: Dahlia Bailiff MD   On: 12/15/2020 16:37   DG Chest Port 1 View  Result Date: 12/15/2020 CLINICAL DATA:  Altered mental status. EXAM: PORTABLE CHEST 1 VIEW COMPARISON:  December 16, 2019. FINDINGS: Stable cardiomegaly. No pneumothorax or pleural effusion is noted. Mild right upper lobe subsegmental atelectasis is noted. Left lung is clear. Bony thorax is unremarkable. IMPRESSION: Mild right upper lobe subsegmental atelectasis. Electronically Signed   By: Marijo Conception M.D.   On: 12/15/2020 16:45     Assessment/Plan  Subacute encephalopathy mentation worsening past 1 to 2 weeks-with worsening confusion, difficulty ambulation, poor oral intake, visual hallucination.  Unclear etiology-no acute CT abnormalities, no evidence of infection,lab with hemoconcentrated possible dehydration given frequent bowel movement poor oral intake and also on Lasix at home-BUN/creatinine appears stable,?  Severe depression playing a role. MRI brain pending.  Continue gentle IV hydration, hold Lasix, keep on fall precautions delirium precaution.  Check TSH B12 B1 ammonia level.  Start IV thiamine.consult PT OT and TOC, possible placement.  If work-up negative may need to have neurology see her.  Chronic diastolic CHF EF 65 to 09%, she appears on the dry side, she is on low-dose Lasix which will hold today and reassess  tomorrow. Check bnp  CKD stage IV baseline creatinine 1.6-1.8, renal function stable at baseline.  Holding Lasix.  A. fib/a flutter on Pradaxa, Toprol-resuming at home dose.continue to monitor on telemetry.   History of CVA: Moving all extremities well.  Continue meds as above.  CT head no acute finding  COPD/asthmatic bronchitis on oxygen as needed, respiratory status is stable.  She uses nightly DuoNeb and as needed oxygen.  Hypothyroidism: Resume home Synthroid and check TSH.  OSA- prn [place] o2.  Severe depression on Wellbutrin Lexapro lithium and Zyprexa.  Lithium level normal 0.7.  She is followed by psychiatry and had televisit last Tuesday and they felt she may need to have some lab work done given her confusion.  Morbid obesity with BMI more than 30 outpatient follow-up with PCP  Goals of care recently seen by [facility].  Discussed with daughter at the bedside she has a signed DNR form.  Body mass index is 31.12  kg/m.   Severity of Illness: * I certify that at the point of admission it is my clinical judgment that the patient will require observation level of  hospital care spanning less than 2 midnights from the point of admission due to intensity of service, risk for further deterioration and high frequency of surveillance required.*    DVT prophylaxis: dabigatran (PRADAXA) capsule 75 mg   Code Status:   Code Status: DNR Family Communication: Admission, patients condition and plan of care including tests being ordered have been discussed with the patient and her daughter who indicate understanding and agree with the plan and Code Status.  Consults called:  None  Antonieta Pert MD [facility]  If 7PM-7AM, please contact night-coverage [email or web address]  12/15/2020, 6:17 PM

## 2020-12-15 NOTE — ED Provider Notes (Signed)
[name] DEPT Provider Note   CSN: [id number] Arrival date & time: 12/15/20  1345     History Chief Complaint  Patient presents with  . Altered Mental Status    Sonya Kaufman is a 87 y.o. female.  The history is provided by the patient, a relative and medical records.  Altered Mental Status  Sonya Kaufman is a 87 y.o. female who presents to the Emergency Department complaining of altered mental status. Level V caveat due to confusion. History is provided by the patient, EMS and the daughter. She presents to the emergency department via  EMS for progressive change in mental status.  She reports feeling unwell. Daughter reports progressive decline over the last week with difficulty ambulating, worsening confusion, poor oral intake. No reports of fevers, vomiting, diarrhea. Symptoms are severe, constant, worsening. Family reports that she is normally able to walk with a walker now is unable to even stand without falling backwards. She also has no hallucinations and new confusion over the last week.     Past Medical History:  Diagnosis Date  . Atrial flutter ([place])   . Benign neoplasm of colon   . Depressive disorder, not elsewhere classified   . Diastolic CHF, chronic ([place])   . Disorder of bone and cartilage, unspecified   . Diverticulosis of colon (without mention of hemorrhage)   . Osteoarthrosis, unspecified whether generalized or localized, unspecified site   . Other and unspecified hyperlipidemia    EF 60-65% by echo 11/2011  . Overweight(278.02)   . Persistent atrial fibrillation ([place])   . Renal insufficiency   . Tachycardia-bradycardia ([place])   . Unspecified hypothyroidism   . Venous insufficiency     Patient Active Problem List   Diagnosis Date Noted  . Failure to thrive in adult 12/16/2019  . Acute on chronic diastolic ACC/AHA stage C congestive heart failure ([place]) 03/27/2015  . Chronic diastolic CHF (congestive heart failure), NYHA  class 2 ([place]) 04/02/2014  . Permanent atrial fibrillation ([place]) 04/02/2014  . Chest pain with moderate risk for cardiac etiology 04/02/2014  . Precordial pain 04/02/2014  . Respiratory distress, acute 11/04/2013  . COPD exacerbation ([place]) 11/03/2013  . COPD with acute exacerbation ([place]) 11/03/2013  . Renal insufficiency 11/29/2011  . Bradycardia 11/29/2011  . Respiratory failure with hypoxia ([place]) 11/21/2011  . Polydipsia 07/29/2011  . Atrial flutter ([place]) 01/28/2011  . Atrial flutter --RVR 01/11/2011  . Chronic diastolic heart failure ([place]) 12/13/2010  . Acute on chronic renal insufficiency 12/13/2010  . COLONIC POLYPS 08/03/2010  . Atrial fibrillation ([place]) 08/03/2010  . DIVERTICULOSIS OF COLON 08/03/2010  . CONSTIPATION 06/01/2009  . OVERWEIGHT 02/02/2008  . CEREBROVASCULAR DISEASE 02/02/2008  . VENOUS INSUFFICIENCY 02/02/2008  . OSTEOPENIA 02/02/2008  . Hypothyroidism 12/31/2007  . HYPERLIPIDEMIA 12/31/2007  . Depression 12/31/2007  . ASTHMATIC BRONCHITIS, ACUTE 12/31/2007  . OSTEOARTHRITIS 12/31/2007    Past Surgical History:  Procedure Laterality Date  . TONSILLECTOMY       OB History   No obstetric history on file.     Family History  Problem Relation Age of Onset  . Heart failure Mother   . Arthritis Mother   . Heart disease Father   . Heart disease Other     Social History   Tobacco Use  . Smoking status: Never Smoker  . Smokeless tobacco: Never Used  Substance Use Topics  . Alcohol use: No  . Drug use: No    Home Medications Prior to Admission medications   Medication  Sig Start Date End Date Taking? Authorizing Provider  acetaminophen (TYLENOL) 500 MG tablet Take 500 mg by mouth every 6 (six) hours as needed for moderate pain.    [provider]  albuterol (VENTOLIN HFA) 108 (90 Base) MCG/ACT inhaler Inhale 2 puffs into the lungs every 6 (six) hours as needed for wheezing or shortness of breath.  10/19/19   [provider]   buPROPion HCl ER, XL, 450 MG TB24 Take 450 mg by mouth daily. 03/16/2020: Wellbutrin being titrated upwards  Over a week's time (300mg  alternating with 450mg ) towards a dose of 450mg  qd    [provider]  escitalopram (LEXAPRO) 20 MG tablet Take 40 mg by mouth daily.     [provider]  esomeprazole (NEXIUM) 20 MG capsule Take 20 mg by mouth daily at 12 noon.    [provider]  furosemide (LASIX) 20 MG tablet Take 2 tablets (40 mg total) by mouth daily. 12/19/19 03/16/20  Harold Hedge, MD  ipratropium-albuterol (DUONEB) 0.5-2.5 (3) MG/3ML SOLN Inhale 3 mLs into the lungs 2 (two) times daily as needed (sob/wheezing).  10/29/19   [provider]  lithium carbonate (LITHOBID) 300 MG CR tablet Take 300 mg by mouth daily.    [provider]  metoprolol succinate (TOPROL-XL) 25 MG 24 hr tablet TAKE ONE TABLET BY MOUTH TWICE DAILY 10/07/17   Allred, Jeneen Rinks, MD  OLANZapine (ZYPREXA) 2.5 MG tablet Take 2.5 mg by mouth at bedtime.  11/21/11   Dunn, Nedra Hai, PA-C  potassium chloride SA (K-DUR,KLOR-CON) 20 MEQ tablet TAKE ONE TABLET BY MOUTH ONCE DAILY 03/10/18   Allred, Jeneen Rinks, MD  PRADAXA 75 MG CAPS capsule TAKE 1 CAPSULE BY MOUTH TWICE DAILY 01/13/18   Allred, Jeneen Rinks, MD  simvastatin (ZOCOR) 20 MG tablet Take 20 mg by mouth daily.    [provider]  SYNTHROID 112 MCG tablet Take 1 tablet by mouth daily before breakfast. 03/23/15   [provider]    Allergies    Patient has no known allergies.  Review of Systems   Review of Systems  All other systems reviewed and are negative.   Physical Exam Updated Vital Signs BP (!) 120/97   Pulse (!) 106   Temp (!) 97.5 F (36.4 C) (Oral)   Resp 20   Ht 5\' 7"  (1.702 m)   SpO2 91%   BMI 31.12 kg/m   Physical Exam Vitals and nursing note reviewed.  Constitutional:      Appearance: She is well-developed.  HENT:     Head: Normocephalic and atraumatic.  Cardiovascular:     Rate and Rhythm:  Normal rate and regular rhythm.     Heart sounds: No murmur heard.   Pulmonary:     Effort: Pulmonary effort is normal. No respiratory distress.     Breath sounds: Normal breath sounds.  Abdominal:     Palpations: Abdomen is soft.     Tenderness: There is no abdominal tenderness. There is no guarding or rebound.  Musculoskeletal:        General: No swelling or tenderness.  Skin:    General: Skin is warm and dry.  Neurological:     Mental Status: She is alert.     Comments: Oriented to person, place.  Disoriented to time.  Significant generalized weakness.  Right sided facial weakness.    Psychiatric:        Behavior: Behavior normal.     ED Results / Procedures / Treatments   Labs (  all labs ordered are listed, but only abnormal results are displayed) Labs Reviewed  COMPREHENSIVE METABOLIC PANEL - Abnormal; Notable for the following components:      Result Value   BUN 26 (*)    Creatinine, Ser 1.43 (*)    GFR, Estimated 35 (*)    All other components within normal limits  CBC WITH DIFFERENTIAL/PLATELET - Abnormal; Notable for the following components:   Hemoglobin 15.3 (*)    HCT 50.3 (*)    MCV 102.4 (*)    All other components within normal limits  CK - Abnormal; Notable for the following components:   Total CK 29 (*)    All other components within normal limits  URINE CULTURE  URINALYSIS, ROUTINE W REFLEX MICROSCOPIC  LITHIUM LEVEL    EKG None  Radiology CT Head Wo Contrast  Result Date: 12/15/2020 CLINICAL DATA:  Altered mental status EXAM: CT HEAD WITHOUT CONTRAST CT CERVICAL SPINE WITHOUT CONTRAST TECHNIQUE: Multidetector CT imaging of the head and cervical spine was performed following the standard protocol without intravenous contrast. Multiplanar CT image reconstructions of the cervical spine were also generated. COMPARISON:  Head CT December 16, 2019. FINDINGS: CT HEAD FINDINGS Brain: No evidence of acute large vascular territory infarction, hemorrhage,  hydrocephalus, extra-axial collection or mass lesion/mass effect. Similar mild burden chronic microvascular ischemic white matter disease. Mild age related global parenchymal volume loss with ex vacuo dilatation of ventricular system, stable. Vascular: No hyperdense vessel. Atherosclerotic calcifications of the intracranial portions of the internal carotid and vertebral arteries. Skull: Benign hyperostosis frontalis. Negative for fracture or focal lesion. Sinuses/Orbits: Paranasal sinuses are predominantly clear. Scattered opacification of the mastoid air cells. Orbits are unremarkable. Other: None CT CERVICAL SPINE FINDINGS Alignment: There is some reversal of the normal cervical lordosis. Grade 1 degenerative anterolisthesis of C4 on C5. Skull base and vertebrae: No acute fracture. No primary bone lesion or focal pathologic process. Soft tissues and spinal canal: No prevertebral fluid or swelling. No visible canal hematoma. Disc levels: Moderate multilevel degenerative changes spine with disc space narrowing osteophytosis and uncovertebral/facet hypertrophy. Upper chest: Evaluation degraded by respiratory motion, no overt abnormality. Other: None IMPRESSION: 1. No acute intracranial abnormality. 2. Similar mild age related global parenchymal volume loss and mild burden chronic microvascular ischemic white matter disease. 3. No evidence of acute fracture or traumatic malalignment of the cervical spine. 4. Moderate multilevel degenerative changes of the cervical spine. Electronically Signed   By: Dahlia Bailiff MD   On: 12/15/2020 16:37   CT Cervical Spine Wo Contrast  Result Date: 12/15/2020 CLINICAL DATA:  Altered mental status EXAM: CT HEAD WITHOUT CONTRAST CT CERVICAL SPINE WITHOUT CONTRAST TECHNIQUE: Multidetector CT imaging of the head and cervical spine was performed following the standard protocol without intravenous contrast. Multiplanar CT image reconstructions of the cervical spine were also  generated. COMPARISON:  Head CT December 16, 2019. FINDINGS: CT HEAD FINDINGS Brain: No evidence of acute large vascular territory infarction, hemorrhage, hydrocephalus, extra-axial collection or mass lesion/mass effect. Similar mild burden chronic microvascular ischemic white matter disease. Mild age related global parenchymal volume loss with ex vacuo dilatation of ventricular system, stable. Vascular: No hyperdense vessel. Atherosclerotic calcifications of the intracranial portions of the internal carotid and vertebral arteries. Skull: Benign hyperostosis frontalis. Negative for fracture or focal lesion. Sinuses/Orbits: Paranasal sinuses are predominantly clear. Scattered opacification of the mastoid air cells. Orbits are unremarkable. Other: None CT CERVICAL SPINE FINDINGS Alignment: There is some reversal of the normal cervical lordosis. Grade 1  degenerative anterolisthesis of C4 on C5. Skull base and vertebrae: No acute fracture. No primary bone lesion or focal pathologic process. Soft tissues and spinal canal: No prevertebral fluid or swelling. No visible canal hematoma. Disc levels: Moderate multilevel degenerative changes spine with disc space narrowing osteophytosis and uncovertebral/facet hypertrophy. Upper chest: Evaluation degraded by respiratory motion, no overt abnormality. Other: None IMPRESSION: 1. No acute intracranial abnormality. 2. Similar mild age related global parenchymal volume loss and mild burden chronic microvascular ischemic white matter disease. 3. No evidence of acute fracture or traumatic malalignment of the cervical spine. 4. Moderate multilevel degenerative changes of the cervical spine. Electronically Signed   By: Dahlia Bailiff MD   On: 12/15/2020 16:37   DG Chest Port 1 View  Result Date: 12/15/2020 CLINICAL DATA:  Altered mental status. EXAM: PORTABLE CHEST 1 VIEW COMPARISON:  December 16, 2019. FINDINGS: Stable cardiomegaly. No pneumothorax or pleural effusion is noted. Mild  right upper lobe subsegmental atelectasis is noted. Left lung is clear. Bony thorax is unremarkable. IMPRESSION: Mild right upper lobe subsegmental atelectasis. Electronically Signed   By: Marijo Conception M.D.   On: 12/15/2020 16:45    Procedures Procedures   Medications Ordered in ED Medications - No data to display  ED Course  I have reviewed the triage vital signs and the nursing notes.  Pertinent labs & imaging results that were available during my care of the patient were reviewed by me and considered in my medical decision making (see chart for details).    MDM Rules/Calculators/A&P                         patient here for evaluation of progressive change in mental status with new hallucinations, generalized weakness and inability to ambulate. UA not consistent with UTI. Imaging is negative for acute CVA. Labs with stable renal insufficiency. Source of symptoms is unclear, recommend admission for ongoing evaluation and workup. Hospitalist consulted for admission. Patient and daughter updated findings of studies and they are in agreement with treatment plan.  Final Clinical Impression(s) / ED Diagnoses Final diagnoses:  Delirium    Rx / DC Orders ED Discharge Orders    None       Quintella Reichert, MD 12/15/20 [phone]

## 2020-12-15 NOTE — ED Triage Notes (Signed)
E[facility] brings pt in from home. Family reports pt has been declining over the past 2 weeks. Reports dark colored urine.

## 2020-12-16 ENCOUNTER — Observation Stay (HOSPITAL_COMMUNITY): Payer: Medicare Other

## 2020-12-16 DIAGNOSIS — E039 Hypothyroidism, unspecified: Secondary | ICD-10-CM | POA: Diagnosis not present

## 2020-12-16 LAB — COMPREHENSIVE METABOLIC PANEL
ALT: 10 U/L (ref 0–44)
AST: 17 U/L (ref 15–41)
Albumin: 3.1 g/dL — ABNORMAL LOW (ref 3.5–5.0)
Alkaline Phosphatase: 66 U/L (ref 38–126)
Anion gap: 8 (ref 5–15)
BUN: 23 mg/dL (ref 8–23)
CO2: 28 mmol/L (ref 22–32)
Calcium: 9 mg/dL (ref 8.9–10.3)
Chloride: 104 mmol/L (ref 98–111)
Creatinine, Ser: 1.44 mg/dL — ABNORMAL HIGH (ref 0.44–1.00)
GFR, Estimated: 35 mL/min — ABNORMAL LOW (ref >60)
Glucose, Bld: 91 mg/dL (ref 70–99)
Potassium: 4.6 mmol/L (ref 3.5–5.1)
Sodium: 140 mmol/L (ref 135–145)
Total Bilirubin: 0.9 mg/dL (ref 0.3–1.2)
Total Protein: 5.8 g/dL — ABNORMAL LOW (ref 6.5–8.1)

## 2020-12-16 LAB — CBC
HCT: 47.8 % — ABNORMAL HIGH (ref 36.0–46.0)
Hemoglobin: 14.5 g/dL (ref 12.0–15.0)
MCH: 31.6 pg (ref 26.0–34.0)
MCHC: 30.3 g/dL (ref 30.0–36.0)
MCV: 104.1 fL — ABNORMAL HIGH (ref 80.0–100.0)
Platelets: 168 10*3/uL (ref 150–400)
RBC: 4.59 MIL/uL (ref 3.87–5.11)
RDW: 14.5 % (ref 11.5–15.5)
WBC: 5.5 10*3/uL (ref 4.0–10.5)
nRBC: 0 % (ref 0.0–0.2)

## 2020-12-16 LAB — BRAIN NATRIURETIC PEPTIDE: B Natriuretic Peptide: 332.9 pg/mL — ABNORMAL HIGH (ref 0.0–100.0)

## 2020-12-16 LAB — SARS CORONAVIRUS 2 (TAT 6-24 HRS): SARS Coronavirus 2: NEGATIVE

## 2020-12-16 MED ORDER — CYANOCOBALAMIN 1000 MCG/ML IJ SOLN
500.0000 ug | Freq: Every day | INTRAMUSCULAR | Status: AC
  Administered 2020-12-16 – 2020-12-18 (×3): 500 ug via SUBCUTANEOUS
  Filled 2020-12-16 (×3): qty 0.5

## 2020-12-16 MED ORDER — CYANOCOBALAMIN 1000 MCG/ML IJ SOLN: 1000.0000 ug | Freq: Every day | INTRAMUSCULAR | Status: DC

## 2020-12-16 NOTE — Evaluation (Addendum)
Occupational Therapy Evaluation Patient Details Name: Sonya Kaufman MRN: [id number] DOB: 1931/07/15 Today's Date: 12/16/2020    History of Present Illness Pt is an 87 year old female admitted for altered mental status including visual hallucination, sudden inability to ambulate, decreased PO intake, and constipation.  PMHx significant for tachycardia-bradycardia, afib, CVA, venous insufficiency, severe depression, and diastolic heart failure, COPD.  MRI of brain shows: Cerebral hemispheres show an old infarction  in the left thalamus, old small vessel infarctions of the basal  ganglia and moderate to marked chronic small-vessel ischemic changes  of the cerebral hemispheric white matter. No acute event.   Clinical Impression   Patient is currently requiring assistance with ADLs including Total assist with toileting, and with LE dressing, Maximum assist with bathing,UE dressing and feeding and minimal assist with simple bed level grooming, all of which is below patient's typical baseline of requiring light assistance with dressing, toileting and sponge bathing.  During this evaluation, patient was limited by generalized weakness, impaired motor planning with intention tremor to BUEs, poor sitting and standing balance, impaired activity tolerance, and anxiety with fear of falling, which has the potential to impact patient's and caregiver's safety and independence during functional mobility, as well as performance for ADLs. [place] "6-clicks" Daily Activity Inpatient Short Form score of 11/24 indicates 70.42% ADL impairment this session. Patient lives with her profoundly disabled son who requires total care, and has round the clock care from 3 other children and a caregiver 4 hours a day M-F. One of pt's daughter's recently injured her shoulder while attempting to transfer pt and daughter has had to lower pt to the floor several times in last 2 weeks due to posterior losses of balance.   Patient is not safe to return home at this time, demonstrates good rehab potential, and should benefit from continued skilled occupational therapy services while in acute care to maximize safety, independence and quality of life at home.  Continued occupational therapy services in a SNF setting prior to return home is recommended.  ?    Follow Up Recommendations  SNF    Equipment Recommendations   (Will defer to post-acute recommendations.)    Recommendations for Other Services       Precautions / Restrictions Precautions Precautions: Fall Precaution Comments: Per daughter, pt tends to fall posteriorly. UE intention tremor. multiple recent falls at home Restrictions Weight Bearing Restrictions: No      Mobility Bed Mobility Overal bed mobility: Needs Assistance (Simultaneous filing. User may not have seen previous data.) Bed Mobility: Supine to Sit;Sit to Supine (Simultaneous filing. User may not have seen previous data.)     Supine to sit: Total assist;+2 for safety/equipment;+2 for physical assistance;Mod assist (Simultaneous filing. User may not have seen previous data.)     General bed mobility comments: bed pad used to assist lateral scooting in supine, assist to elevate trunk to full sitting position,incr time and multi-modal cues to assist self (Simultaneous filing. User may not have seen previous data.)    Transfers Overall transfer level: Needs assistance (Simultaneous filing. User may not have seen previous data.) Equipment used: Rolling walker (2 wheeled);Ambulation equipment used (Simultaneous filing. User may not have seen previous data.) Transfers: Sit to/from [employer] (Simultaneous filing. User may not have seen previous data.) Sit to Stand: Min assist;Mod assist;+2 physical assistance;+2 safety/equipment (Simultaneous filing. User may not have seen previous data.)         General transfer comment: sit<>stand repeated for activity tolerance  and use of Sara-stedy for pivot to recliner. verbal cues for anterior-superior wt shift and for knee/hip/trunk extension. pt with difficulty wt shifting in standing d/t R knee pain. very fearful of falling (Simultaneous filing. User may not have seen previous data.)    Balance Overall balance assessment: Needs assistance;History of Falls (Simultaneous filing. User may not have seen previous data.) Sitting-balance support: Feet supported;Single extremity supported;No upper extremity supported Sitting balance-Leahy Scale: Poor (Simultaneous filing. User may not have seen previous data.) Sitting balance - Comments: Pt sitting EOB with fair balance with feet supported and Bil UE support.  Sitting in recliner however poor with RT lateral lean and need of extra pillows to position pt to midline.   Standing balance support: Bilateral upper extremity supported Standing balance-Leahy Scale: Poor (Simultaneous filing. User may not have seen previous data.) Standing balance comment: Requires BUE supoprt, external assistance. (Simultaneous filing. User may not have seen previous data.)                           ADL either performed or assessed with clinical judgement   ADL Overall ADL's : Needs assistance/impaired Eating/Feeding: Moderate assistance;Sitting;Bed level Eating/Feeding Details (indicate cue type and reason): Due to intention tremor. Daughter educated on gyroscopic vs weighted utentsils and use of wrist weights in attempt to inhibit tremor to allow for increased UE control for feeding. Grooming: Minimal assistance;Bed level   Upper Body Bathing: Moderate assistance;Bed level   Lower Body Bathing: Maximal assistance;Sitting/lateral leans;Bed level   Upper Body Dressing : Maximal assistance;Sitting   Lower Body Dressing: Total assistance;Bed level   Toilet Transfer: BSC;Cueing for sequencing Toilet Transfer Details (indicate cue type and reason): Use of Denna Haggard. Toileting-  Clothing Manipulation and Hygiene: Total assistance Toileting - Clothing Manipulation Details (indicate cue type and reason): On pure wick   Tub/Shower Transfer Details (indicate cue type and reason): Pt does not enter her shower or bathroom at baseline. Functional mobility during ADLs: Moderate assistance;+2 for safety/equipment;+2 for physical assistance;Total assistance       Vision Baseline Vision/History: Wears glasses Wears Glasses:  (Bifocals but rarely wears them.) Patient Visual Report: Diplopia Vision Assessment?: Vision impaired- to be further tested in functional context Additional Comments: Pt endorses recent onset of diplopia which comes and goes for last ~2 months.     Perception     Praxis      Pertinent Vitals/Pain Pain Assessment: Faces Faces Pain Scale: Hurts little more Pain Location: with R knee flexion Pain Descriptors / Indicators: Grimacing Pain Intervention(s): Monitored during session;Limited activity within patient's tolerance     Hand Dominance Right   Extremity/Trunk Assessment Upper Extremity Assessment Upper Extremity Assessment: Defer to OT evaluation RUE Deficits / Details: Dtr reports a shoulder injury with decreased ROM to ~90 degrees in supine. RUE Coordination: decreased fine motor;decreased gross motor LUE Deficits / Details: AROM and MMT: WFL. LUE Coordination: decreased fine motor;decreased gross motor   Lower Extremity Assessment Lower Extremity Assessment: RLE deficits/detail;LLE deficits/detail RLE Deficits / Details: knee flexion limited d/t baseline OA (per pt and dtr). strength 2+.5 LLE Deficits / Details: AROM WFL with incr time, strength grossly 3 to 3+/5   Cervical / Trunk Assessment Cervical / Trunk Assessment: Kyphotic;Other exceptions Cervical / Trunk Exceptions: Forward head. RT leaning in sitting which daughter stated is common lately.   Communication Communication Communication: HOH   Cognition Arousal/Alertness:  Awake/alert Behavior During Therapy: Flat affect;WFL for tasks assessed/performed Overall Cognitive Status: Within  Functional Limits for tasks assessed                                 General Comments: Delayed processing vs HOH.  Pleasant and cooperative.  Very fearful of falling. Pt oriented to person, place, and month.  Not year "1922" or situation.   General Comments       Exercises     Shoulder Instructions      Home Living Family/patient expects to be discharged to:: Skilled nursing facility Living Arrangements: Children (Pt's son lives with pt and requires total care with his own daily caregiver. Pt was helping to care for son, but no longer can.  Pt recevies 24/7 supervision and assistance from 3 other children and from her own private caretgiver M-F, 4 hours a day.) Available Help at Discharge: Family;Personal care attendant;Available 24 hours/day Type of Home: House Home Access: Ramped entrance     Home Layout: One level     Bathroom Shower/Tub: Tub/shower unit (Pt no longer enters bathroom due to fear of falling. Sponge bathes and uses bedside commode.)   Bathroom Toilet: Handicapped height     Home Equipment: Walker - 4 wheels;Cane - quad;Cane - single point;Bedside commode;Wheelchair - Insurance claims handler - 2 wheels   Additional Comments: Home O2 PRN, adjustable bed without rails, lift recliner.      Prior Functioning/Environment Level of Independence: Needs assistance  Gait / Transfers Assistance Needed: Household distances with rollator. No falls until ~1-2 weeks ago when pt began falling frequently with need of children lowering her to floor. ADL's / Homemaking Assistance Needed: Pt requires assistance with all dressing, sponge bathing, feeding and grooming due to intention tremor, and lately with all mobility but baseline was able to transfer and ambulate Mod I. Continent of bowel and bladder at baseline but wears a pull up to bed.  Pt does  not drive, and family provides transportation, all cooking.cleaning/laundry and medication management.            OT Problem List: Decreased strength;Decreased coordination;Cardiopulmonary status limiting activity;Decreased safety awareness;Decreased activity tolerance;Impaired balance (sitting and/or standing);Decreased knowledge of use of DME or AE;Impaired vision/perception;Decreased knowledge of precautions      OT Treatment/Interventions: Self-care/ADL training;Therapeutic exercise;Therapeutic activities;Energy conservation;Visual/perceptual remediation/compensation;DME and/or AE instruction;Patient/family education;Balance training    OT Goals(Current goals can be found in the care plan section) Acute Rehab OT Goals Patient Stated Goal: go to rehab (per dtr) OT Goal Formulation: With patient/family Time For Goal Achievement: 12/30/20 Potential to Achieve Goals: Good ADL Goals Pt Will Perform Grooming: sitting;with set-up;with min guard assist Pt Will Perform Upper Body Dressing: with min assist;sitting Pt Will Perform Lower Body Dressing: with min assist;with caregiver independent in assisting;with adaptive equipment;sitting/lateral leans;sit to/from stand Pt Will Transfer to Toilet: stand pivot transfer;bedside commode;with min guard assist Pt Will Perform Toileting - Clothing Manipulation and hygiene: with min assist;with adaptive equipment;with caregiver independent in assisting;sitting/lateral leans;sit to/from stand Additional ADL Goal #1: Pt will improve sitting balance to good static and fair+ dynamic without need of UE support in order to increase safety and participation during seated ADLs.  OT Frequency: Min 2X/week   Barriers to D/C:    Recent family injuries attempting to assist the pt. Currently pt requires more assistance than family can safely provide.       Co-evaluation PT/OT/SLP Co-Evaluation/Treatment: Yes Reason for Co-Treatment: For patient/therapist  safety;Complexity of the patient's impairments (multi-system involvement) PT goals addressed during  session: Mobility/safety with mobility;Proper use of DME OT goals addressed during session: ADL's and self-care;Proper use of Adaptive equipment and DME      AM-PAC OT "6 Clicks" Daily Activity     Outcome Measure Help from another person eating meals?: A Lot Help from another person taking care of personal grooming?: A Little Help from another person toileting, which includes using toliet, bedpan, or urinal?: Total Help from another person bathing (including washing, rinsing, drying)?: A Lot Help from another person to put on and taking off regular upper body clothing?: A Lot Help from another person to put on and taking off regular lower body clothing?: Total 6 Click Score: 11   End of Session Equipment Utilized During Treatment: Gait belt;Rolling walker;Other (comment) Charlaine Dalton) Nurse Communication: Mobility status;Need for lift equipment  Activity Tolerance: Patient tolerated treatment well;Patient limited by fatigue Patient left: with call bell/phone within reach;in chair;with family/visitor present  OT Visit Diagnosis: Unsteadiness on feet (R26.81);Repeated falls (R29.6);Muscle weakness (generalized) (M62.81);History of falling (Z91.81);Other symptoms and signs involving the nervous system (R29.898);Dizziness and giddiness (R42)                Time: 3704-8889 OT Time Calculation (min): 43 min Charges:  OT General Charges $OT Visit: 1 Visit OT Evaluation $OT Eval Moderate Complexity: 1 Mod OT Treatments $Therapeutic Activity: 8-22 mins  Anderson Malta, [place] Office: [phone] 12/16/2020  Julien Girt 12/16/2020, 3:10 PM

## 2020-12-16 NOTE — Progress Notes (Signed)
PROGRESS NOTE    Sonya Kaufman  ZOX:[id number] DOB: 06/07/1931 DOA: 12/15/2020 PCP: Shon Baton, MD   Chief Complaint  Patient presents with  . Altered Mental Status  Brief Narrative: 87 y.o. female with medical history significant for chronic diastolic CHF EF 65 to 81%, CKD stage IV baseline creatinine 1.6-1.8, A. fib/a flutter on Pradaxa, Toprol, history of CVA, venous insufficiency, COPD/asthmatic bronchitis on oxygen as needed, hypothyroidism, OSA, severe depression on Wellbutrin Lexapro lithium and Zyprexa brought to the ED for evaluation of altered mental status difficulty with ambulation. Patient lives with her son who is disabled w/ cerebral palsy and also has a caregiver at home.  Patient has been having progressive change in mental status for past 1 to 2 weeks feeling unwell progressively declining with difficulty ambulation worsening confusion, poor oral intake, frequent semisolid bowel movement about 4 times a day incontinent, but has bladder control, family also reporting she has been hallucinating seeing ants and spider. At baseline normal able to walk with a walker but now not able to stand without falling backwards. No report of nausea, vomiting, fever, chills. reviewed epic noted-patient was seen by [facility] palliative care for goals of care discussion on 12/05/2020 where she was noted to be mildly confused but alert awake, and also similar hallucination.  In the ED blood pressure was stable, underwent further work-up CT head age-related changes CT spine DJD, chest x-ray evidence of pneumonia, UA with negative nitrite and leuk esterase, CBC BMP shows baseline CKD, hemoconcentration with elevated hemoglobin. Daughter reporting that they have unable to take care of her at home MRI brain was ordered and pending, COVID-19 back negative.  Patient was admitted.    Subjective: Seen this morning.  She was just back from MRI.  She appears more alert awake, she knows where she is at,  current date current president but appears weak and deconditioned. No Other acute events overnight  Assessment & Plan:  Subacute metabolic encephalopathy mentation worsening past 1 to 2 weeks-with worsening confusion, difficulty ambulation, poor oral intake, visual hallucinations.  CT head no acute finding,no evidence of infection chest x-ray and UA-MRI brain today shows no acute finding,lab with hemoconcentrated -suspecting component of dehydration, and deconditioning with multiple comorbidities/failure to thrive .  With IV fluid hydration appears to be perked up, continue to hold Lasix today, gentle IV fluid hydration-with close monitoring of fluid status, B12 is borderline 213 suspected B12 insufficiency we will start high-dose replacement, continue thiamine. B1 level Pending, nh3, tsh normal.  Continue PT OT, possible placement  Chronic diastolic CHF EF 65 to 19%, patient is still on dry side.  Continue to hold Lasix and keep on IV fluid hydration.  BNP is stable at 332 previously 504.  Check BMP in a.m.  CKD stage IV baseline creatinine 1.6-1.8, currently 1.4.  Stable continue to hold Lasix.  A. fib/a flutter  controlled.  Continue Pradaxa and Toprol   History of CVA: Moving all extremities but weak diffusely, no acute stroke on MRI brain.  COPD/asthmatic bronchitis on oxygen as needed, monitor respiratory status continue DuoNeb nightly, continue as needed oxygen  Hypothyroidism:  TSH stable continue Synthroid.    OSA- prn [place] o2.  Severe depression  mood is okay although weak and deconditioned.Continue her home Wellbutrin Lexapro lithium and Zyprexa.  Lithium level normal 0.7.  She is followed by psychiatry and had televisit last Tuesday and they felt she may need to have some lab work done given her confusion.  Morbid obesity with BMI  more than 30 outpatient follow-up with PCP  Goals of care recently seen by [facility].  Discussed with daughter at the bedside she has a signed  DNR form. Supportive measures  Diet Order            DIET SOFT Room service appropriate? Yes; Fluid consistency: Thin  Diet effective now                 Patient's Body mass index is 29.25 kg/m.  DVT prophylaxis: Pradaxa Code Status:   Code Status: DNR  Family Communication: plan of care discussed with patient at bedside. Discussed with daughter on admission.  No family at bedside this morning. Called and updated patient's daughter . she is one of our nursing staffs at cancer center  Status [id number] as  Observation Remains  hospitalized for ongoing dehydration weakness deconditioning  Dispo: The patient is from: Home              Anticipated d/c is to: SNF              Patient currently is not medically stable to d/c.   Difficult to place patient No   Unresulted Labs (From admission, onward)          Start     Ordered   12/16/20 0711  Vitamin B1  Once,   R        12/16/20 0711   12/15/20 1434  Urine culture  ONCE - STAT,   STAT        12/15/20 1433        Medications reviewed:  Scheduled Meds: . buPROPion  450 mg Oral Daily  . dabigatran  75 mg Oral BID  . escitalopram  40 mg Oral Daily  . ipratropium-albuterol  3 mL Nebulization QHS  . levothyroxine  112 mcg Oral Q0600  . lithium carbonate  300 mg Oral Daily  . metoprolol tartrate  25 mg Oral BID  . OLANZapine  5 mg Oral QHS  . pantoprazole  40 mg Oral Daily  . simvastatin  20 mg Oral Daily  . thiamine injection  100 mg Intravenous Daily   Continuous Infusions: . sodium chloride 50 mL/hr at 12/15/20 1906    Consultants:see note  Procedures:see note  Antimicrobials: Anti-infectives (From admission, onward)   None     Culture/Microbiology    Component Value Date/Time   SDES BLOOD RIGHT HAND 12/16/2019 1203   SPECREQUEST  12/16/2019 1203    BOTTLES DRAWN AEROBIC AND ANAEROBIC Blood Culture adequate volume Performed at [facility] [address], [place], [place] [address]     CULT NO GROWTH 5 DAYS 12/16/2019 1203   REPTSTATUS 12/21/2019 FINAL 12/16/2019 1203    Other culture-see note  Objective: Vitals: Today's Vitals   12/16/20 0318 12/16/20 0351 12/16/20 0735 12/16/20 1033  BP:  114/74  125/76  Pulse:  69  69  Resp:  14  18  Temp:  97.6 F (36.4 C)  97.6 F (36.4 C)  TempSrc:  Oral  Oral  SpO2:  92%  100%  Weight:      Height:      PainSc: 2   0-No pain     Intake/Output Summary (Last 24 hours) at 12/16/2020 1328 Last data filed at 12/16/2020 1015 Gross per 24 hour  Intake 240 ml  Output --  Net 240 ml   Filed Weights   12/15/20 1943  Weight: 84.7 kg   Weight change:   Intake/Output from previous day:  No intake/output data recorded. Intake/Output this shift: Total I/O In: 240 [P.O.:240] Out: -  Filed Weights   12/15/20 1943  Weight: 84.7 kg    Examination:  General exam: AAOx3 to  place date current president,NAD, weak appearing. HEENT:Oral mucosa moist, Ear/Nose WNL grossly,dentition normal. Respiratory system: bilaterally diminished,no use of accessory muscle, non tender. Cardiovascular system: S1 & S2 +, regular, No JVD. Gastrointestinal system: Abdomen soft, NT,ND, BS+. Nervous System:Alert, awake, moving extremities, but with diffuse generalized weakness, Extremities: No edema, distal peripheral pulses palpable.  Skin: No rashes,no icterus. MSK: Normal muscle bulk,tone, power  Data Reviewed: I have personally reviewed following labs and imaging studies CBC: Recent Labs  Lab 12/15/20 1410 12/16/20 0547  WBC 5.9 5.5  NEUTROABS 3.7  --   HGB 15.3* 14.5  HCT 50.3* 47.8*  MCV 102.4* 104.1*  PLT 184 734   Basic Metabolic Panel: Recent Labs  Lab 12/15/20 1410 12/16/20 0547  NA 141 140  K 4.5 4.6  CL 105 104  CO2 28 28  GLUCOSE 97 91  BUN 26* 23  CREATININE 1.43* 1.44*  CALCIUM 9.0 9.0   GFR: Estimated Creatinine Clearance: 30.8 mL/min (A) (by C-G formula based on SCr of 1.44 mg/dL (H)). Liver Function  Tests: Recent Labs  Lab 12/15/20 1410 12/16/20 0547  AST 17 17  ALT 13 10  ALKPHOS 79 66  BILITOT 0.3 0.9  PROT 6.5 5.8*  ALBUMIN 3.5 3.1*   No results for input(s): LIPASE, AMYLASE in the last 168 hours. Recent Labs  Lab 12/15/20 2007  AMMONIA 21   Coagulation Profile: No results for input(s): INR, PROTIME in the last 168 hours. Cardiac Enzymes: Recent Labs  Lab 12/15/20 1410  CKTOTAL 29*   BNP (last 3 results) No results for input(s): PROBNP in the last 8760 hours. HbA1C: No results for input(s): HGBA1C in the last 72 hours. CBG: No results for input(s): GLUCAP in the last 168 hours. Lipid Profile: No results for input(s): CHOL, HDL, LDLCALC, TRIG, CHOLHDL, LDLDIRECT in the last 72 hours. Thyroid Function Tests: Recent Labs    12/15/20 2007  TSH 1.386   Anemia Panel: Recent Labs    12/15/20 2007  VITAMINB12 213   Sepsis Labs: No results for input(s): PROCALCITON, LATICACIDVEN in the last 168 hours.  Recent Results (from the past 240 hour(s))  SARS CORONAVIRUS 2 (TAT 6-24 HRS) Nasopharyngeal Nasopharyngeal Swab     Status: None   Collection Time: 12/15/20  6:46 PM   Specimen: Nasopharyngeal Swab  Result Value Ref Range Status   SARS Coronavirus 2 NEGATIVE NEGATIVE Final    Comment: (NOTE) SARS-CoV-2 target nucleic acids are NOT DETECTED.  The SARS-CoV-2 RNA is generally detectable in upper and lower respiratory specimens during the acute phase of infection. Negative results do not preclude SARS-CoV-2 infection, do not rule out co-infections with other pathogens, and should not be used as the sole basis for treatment or other patient management decisions. Negative results must be combined with clinical observations, patient history, and epidemiological information. The expected result is Negative.  Fact Sheet for Patients: SugarRoll.be  Fact Sheet for Healthcare  Providers: https://www.woods-mathews.com/  This test is not yet approved or cleared by the [place] FDA and  has been authorized for detection and/or diagnosis of SARS-CoV-2 by FDA under an Emergency Use Authorization (EUA). This EUA will remain  in effect (meaning this test can be used) for the duration of the COVID-19 declaration under Se ction 564(b)(1) of the Act, 21 U.S.C. section  360bbb-3(b)(1), unless the authorization is terminated or revoked sooner.  Performed at [facility], [place] [address] [place], [place] [address]      Radiology Studies: CT Head Wo Contrast  Result Date: 12/15/2020 CLINICAL DATA:  Altered mental status EXAM: CT HEAD WITHOUT CONTRAST CT CERVICAL SPINE WITHOUT CONTRAST TECHNIQUE: Multidetector CT imaging of the head and cervical spine was performed following the standard protocol without intravenous contrast. Multiplanar CT image reconstructions of the cervical spine were also generated. COMPARISON:  Head CT December 16, 2019. FINDINGS: CT HEAD FINDINGS Brain: No evidence of acute large vascular territory infarction, hemorrhage, hydrocephalus, extra-axial collection or mass lesion/mass effect. Similar mild burden chronic microvascular ischemic white matter disease. Mild age related global parenchymal volume loss with ex vacuo dilatation of ventricular system, stable. Vascular: No hyperdense vessel. Atherosclerotic calcifications of the intracranial portions of the internal carotid and vertebral arteries. Skull: Benign hyperostosis frontalis. Negative for fracture or focal lesion. Sinuses/Orbits: Paranasal sinuses are predominantly clear. Scattered opacification of the mastoid air cells. Orbits are unremarkable. Other: None CT CERVICAL SPINE FINDINGS Alignment: There is some reversal of the normal cervical lordosis. Grade 1 degenerative anterolisthesis of C4 on C5. Skull base and vertebrae: No acute fracture. No primary bone lesion or focal pathologic  process. Soft tissues and spinal canal: No prevertebral fluid or swelling. No visible canal hematoma. Disc levels: Moderate multilevel degenerative changes spine with disc space narrowing osteophytosis and uncovertebral/facet hypertrophy. Upper chest: Evaluation degraded by respiratory motion, no overt abnormality. Other: None IMPRESSION: 1. No acute intracranial abnormality. 2. Similar mild age related global parenchymal volume loss and mild burden chronic microvascular ischemic white matter disease. 3. No evidence of acute fracture or traumatic malalignment of the cervical spine. 4. Moderate multilevel degenerative changes of the cervical spine. Electronically Signed   By: Dahlia Bailiff MD   On: 12/15/2020 16:37   CT Cervical Spine Wo Contrast  Result Date: 12/15/2020 CLINICAL DATA:  Altered mental status EXAM: CT HEAD WITHOUT CONTRAST CT CERVICAL SPINE WITHOUT CONTRAST TECHNIQUE: Multidetector CT imaging of the head and cervical spine was performed following the standard protocol without intravenous contrast. Multiplanar CT image reconstructions of the cervical spine were also generated. COMPARISON:  Head CT December 16, 2019. FINDINGS: CT HEAD FINDINGS Brain: No evidence of acute large vascular territory infarction, hemorrhage, hydrocephalus, extra-axial collection or mass lesion/mass effect. Similar mild burden chronic microvascular ischemic white matter disease. Mild age related global parenchymal volume loss with ex vacuo dilatation of ventricular system, stable. Vascular: No hyperdense vessel. Atherosclerotic calcifications of the intracranial portions of the internal carotid and vertebral arteries. Skull: Benign hyperostosis frontalis. Negative for fracture or focal lesion. Sinuses/Orbits: Paranasal sinuses are predominantly clear. Scattered opacification of the mastoid air cells. Orbits are unremarkable. Other: None CT CERVICAL SPINE FINDINGS Alignment: There is some reversal of the normal cervical  lordosis. Grade 1 degenerative anterolisthesis of C4 on C5. Skull base and vertebrae: No acute fracture. No primary bone lesion or focal pathologic process. Soft tissues and spinal canal: No prevertebral fluid or swelling. No visible canal hematoma. Disc levels: Moderate multilevel degenerative changes spine with disc space narrowing osteophytosis and uncovertebral/facet hypertrophy. Upper chest: Evaluation degraded by respiratory motion, no overt abnormality. Other: None IMPRESSION: 1. No acute intracranial abnormality. 2. Similar mild age related global parenchymal volume loss and mild burden chronic microvascular ischemic white matter disease. 3. No evidence of acute fracture or traumatic malalignment of the cervical spine. 4. Moderate multilevel degenerative changes of the cervical spine. Electronically  Signed   By: Dahlia Bailiff MD   On: 12/15/2020 16:37   MR Brain Wo Contrast (neuro protocol)  Result Date: 12/16/2020 CLINICAL DATA:  Mental status changes of unknown cause. EXAM: MRI HEAD WITHOUT CONTRAST TECHNIQUE: Multiplanar, multiecho pulse sequences of the brain and surrounding structures were obtained without intravenous contrast. COMPARISON:  Head CT yesterday. FINDINGS: Brain: Diffusion imaging does not show any acute or subacute infarction or other cause of restricted diffusion. There are chronic small-vessel ischemic changes of the pons, worse on the right. No focal cerebellar insult. Cerebral hemispheres show an old infarction in the left thalamus, old small vessel infarctions of the basal ganglia and moderate to marked chronic small-vessel ischemic changes of the cerebral hemispheric white matter. No cortical or large vessel territory infarction. No mass lesion, hemorrhage, hydrocephalus or extra-axial collection. Vascular: Major vessels at the base of the brain show flow. Skull and upper cervical spine: Negative Sinuses/Orbits: Clear/normal Other: None IMPRESSION: No acute or reversible  finding. Chronic small-vessel ischemic changes throughout the brain as outlined above. Electronically Signed   By: Nelson Chimes M.D.   On: 12/16/2020 10:17   DG Chest Port 1 View  Result Date: 12/15/2020 CLINICAL DATA:  Altered mental status. EXAM: PORTABLE CHEST 1 VIEW COMPARISON:  December 16, 2019. FINDINGS: Stable cardiomegaly. No pneumothorax or pleural effusion is noted. Mild right upper lobe subsegmental atelectasis is noted. Left lung is clear. Bony thorax is unremarkable. IMPRESSION: Mild right upper lobe subsegmental atelectasis. Electronically Signed   By: Marijo Conception M.D.   On: 12/15/2020 16:45     LOS: 0 days   Antonieta Pert, MD [facility]  12/16/2020, 1:28 PM

## 2020-12-16 NOTE — Evaluation (Signed)
Physical Therapy Evaluation Patient Details Name: Sonya Kaufman MRN: [id number] DOB: [birth year] Today's Date: 12/16/2020   History of Present Illness  Pt is an 87 year old female admitted for altered mental status including visual hallucination, sudden inability to ambulate, decreased PO intake, and constipation.  PMHx significant for tachycardia-bradycardia, afib, CVA, venous insufficiency, severe depression, and diastolic heart failure, COPD.  MRI of brain shows: Cerebral hemispheres show an old infarction  in the left thalamus, old small vessel infarctions of the basal  ganglia and moderate to marked chronic small-vessel ischemic changes  of the cerebral hemispheric white matter. No acute event.  Clinical Impression  Pt admitted with above diagnosis.  Pt with incr falls per dtr as well as overall recent functional decline. Pt presents today with global weakness and deconditioning--will benefit from SNF post acute. Pt on 1.5L [place] O2,wears as needed at home. desats to 87-88% on RA with bed to chair transfers. Will continue PT in acute setting. Pt currently with functional limitations due to the deficits listed below (see PT Problem List). Pt will benefit from skilled PT to increase their independence and safety with mobility to allow discharge to the venue listed below.     Follow Up Recommendations SNF    Equipment Recommendations  None recommended by PT    Recommendations for Other Services       Precautions / Restrictions Precautions Precautions: Fall Precaution Comments: Per daughter, pt tends to fall posteriorly. UE intention tremor. multiple recent falls at home Restrictions Weight Bearing Restrictions: No      Mobility  Bed Mobility Overal bed mobility: Needs Assistance (Simultaneous filing. User may not have seen previous data.) Bed Mobility: Supine to Sit;Sit to Supine (Simultaneous filing. User may not have seen previous data.)     Supine to sit: Min assist;+2 for  safety/equipment;+2 for physical assistance;Mod assist (Simultaneous filing. User may not have seen previous data.)     General bed mobility comments: bed pad used to assist lateral scooting in supine, assist to elevate trunk to full sitting position,incr time and multi-modal cues to assist self (Simultaneous filing. User may not have seen previous data.)    Transfers Overall transfer level: Needs assistance (Simultaneous filing. User may not have seen previous data.) Equipment used: Rolling walker (2 wheeled);Ambulation equipment used (Simultaneous filing. User may not have seen previous data.) Transfers: Sit to/from [facility] (Simultaneous filing. User may not have seen previous data.) Sit to Stand: Min assist;Mod assist;+2 physical assistance;+2 safety/equipment (Simultaneous filing. User may not have seen previous data.)         General transfer comment: sit<>stand repeated for activity tolerance and use of Sara-stedy for pivot to recliner. verbal cues for anterior-superior wt shift and for knee/hip/trunk extension. pt with difficulty wt shifting in standing d/t R knee pain. very fearful of falling (Simultaneous filing. User may not have seen previous data.)  Ambulation/Gait             General Gait Details: unable  Stairs            Wheelchair Mobility    Modified Rankin (Stroke Patients Only)       Balance Overall balance assessment: Needs assistance;History of Falls (Simultaneous filing. User may not have seen previous data.) Sitting-balance support: Feet supported;Single extremity supported;No upper extremity supported Sitting balance-Leahy Scale: Poor (Simultaneous filing. User may not have seen previous data.) Sitting balance - Comments: Pt sitting EOB with fair balance with feet supported and Bil UE support.  Sitting in recliner  however poor with RT lateral lean and need of extra pillows to position pt to midline.   Standing balance support:  Bilateral upper extremity supported Standing balance-Leahy Scale: Poor (Simultaneous filing. User may not have seen previous data.) Standing balance comment: Requires BUE supoprt, external assistance. (Simultaneous filing. User may not have seen previous data.)                             Pertinent Vitals/Pain Pain Assessment: Faces Faces Pain Scale: Hurts little more Pain Location: with R knee flexion Pain Descriptors / Indicators: Grimacing Pain Intervention(s): Monitored during session;Limited activity within patient's tolerance    Home Living Family/patient expects to be discharged to:: Skilled nursing facility Living Arrangements: Children (Pt's son lives with pt and requires total care with his own daily caregiver. Pt was helping to care for son, but no longer can.  Pt recevies 24/7 supervision and assistance from 3 other children and from her own private caretgiver M-F, 4 hours a day.) Available Help at Discharge: Family;Personal care attendant;Available 24 hours/day Type of Home: House Home Access: Ramped entrance     Home Layout: One level Home Equipment: Walker - 4 wheels;Cane - quad;Cane - single point;Bedside commode;Wheelchair - Insurance claims handler - 2 wheels Additional Comments: Home O2 PRN, adjustable bed without rails, lift recliner.    Prior Function Level of Independence: Needs assistance   Gait / Transfers Assistance Needed: Household distances with rollator. No falls until ~1-2 weeks ago when pt began falling frequently with need of children lowering her to floor.  ADL's / Homemaking Assistance Needed: Pt requires assistance with all dressing, sponge bathing, feeding and grooming due to intention tremor, and lately with all mobility but baseline was able to transfer and ambulate Mod I. Continent of bowel and bladder at baseline but wears a pull up to bed.  Pt does not drive, and family provides transportation, all cooking.cleaning/laundry and  medication management.        Hand Dominance   Dominant Hand: Right    Extremity/Trunk Assessment   Upper Extremity Assessment Upper Extremity Assessment: Defer to OT evaluation RUE Deficits / Details: Dtr reports a shoulder injury with decreased ROM to ~90 degrees in supine. RUE Coordination: decreased fine motor;decreased gross motor LUE Deficits / Details: AROM and MMT: WFL. LUE Coordination: decreased fine motor;decreased gross motor    Lower Extremity Assessment Lower Extremity Assessment: RLE deficits/detail;LLE deficits/detail RLE Deficits / Details: knee flexion limited d/t baseline OA (per pt and dtr). strength 2+.5 LLE Deficits / Details: AROM WFL with incr time, strength grossly 3 to 3+/5    Cervical / Trunk Assessment Cervical / Trunk Assessment: Kyphotic;Other exceptions Cervical / Trunk Exceptions: Forward head. RT leaning in sitting which daughter stated is common lately.  Communication   Communication: HOH  Cognition Arousal/Alertness: Awake/alert Behavior During Therapy: Flat affect;WFL for tasks assessed/performed Overall Cognitive Status: Within Functional Limits for tasks assessed                                 General Comments: Delayed processing vs HOH.  Pleasant and cooperative.  Very fearful of falling. Pt oriented to person, place, and month.  Not year "1922" or situation.      General Comments      Exercises     Assessment/Plan    PT Assessment Patient needs continued PT services  PT Problem List Decreased strength;Decreased  range of motion;Decreased activity tolerance;Decreased mobility;Decreased balance;Pain;Decreased knowledge of use of DME       PT Treatment Interventions DME instruction;Therapeutic activities;Gait training;Functional mobility training;Therapeutic exercise;Patient/family education;Balance training    PT Goals (Current goals can be found in the Care Plan section)  Acute Rehab PT Goals Patient Stated  Goal: go to rehab (per dtr) PT Goal Formulation: With patient/family Time For Goal Achievement: 12/30/20 Potential to Achieve Goals: Good    Frequency Min 2X/week   Barriers to discharge        Co-evaluation PT/OT/SLP Co-Evaluation/Treatment: Yes Reason for Co-Treatment: For patient/therapist safety;Complexity of the patient's impairments (multi-system involvement) PT goals addressed during session: Mobility/safety with mobility;Proper use of DME OT goals addressed during session: ADL's and self-care;Proper use of [facility] equipment and DME       AM-PAC PT "6 Clicks" Mobility  Outcome Measure Help needed turning from your back to your side while in a flat bed without using bedrails?: A Lot Help needed moving from lying on your back to sitting on the side of a flat bed without using bedrails?: A Lot Help needed moving to and from a bed to a chair (including a wheelchair)?: Total Help needed standing up from a chair using your arms (e.g., wheelchair or bedside chair)?: Total Help needed to walk in hospital room?: Total Help needed climbing 3-5 steps with a railing? : Total 6 Click Score: 8    End of Session Equipment Utilized During Treatment: Gait belt Activity Tolerance: Patient limited by fatigue Patient left: in chair;with call bell/phone within reach;with chair alarm set;with family/visitor present Nurse Communication: Mobility status PT Visit Diagnosis: Other abnormalities of gait and mobility (R26.89)    Time: 3009-2330 PT Time Calculation (min) (ACUTE ONLY): 27 min   Charges:   PT Evaluation $PT Eval Low Complexity: [place], PT  Acute Rehab Dept ([place]) [phone] Pager [phone]  12/16/2020   [facility] 12/16/2020, 3:05 PM

## 2020-12-17 ENCOUNTER — Inpatient Hospital Stay (HOSPITAL_COMMUNITY): Payer: Medicare Other

## 2020-12-17 DIAGNOSIS — I4821 Permanent atrial fibrillation: Secondary | ICD-10-CM | POA: Diagnosis present

## 2020-12-17 DIAGNOSIS — R4182 Altered mental status, unspecified: Secondary | ICD-10-CM | POA: Diagnosis present

## 2020-12-17 DIAGNOSIS — G9341 Metabolic encephalopathy: Secondary | ICD-10-CM | POA: Diagnosis present

## 2020-12-17 DIAGNOSIS — E785 Hyperlipidemia, unspecified: Secondary | ICD-10-CM | POA: Diagnosis present

## 2020-12-17 DIAGNOSIS — F05 Delirium due to known physiological condition: Secondary | ICD-10-CM | POA: Diagnosis present

## 2020-12-17 DIAGNOSIS — I679 Cerebrovascular disease, unspecified: Secondary | ICD-10-CM | POA: Diagnosis present

## 2020-12-17 DIAGNOSIS — R410 Disorientation, unspecified: Secondary | ICD-10-CM | POA: Diagnosis not present

## 2020-12-17 DIAGNOSIS — R0902 Hypoxemia: Secondary | ICD-10-CM | POA: Diagnosis not present

## 2020-12-17 DIAGNOSIS — E039 Hypothyroidism, unspecified: Secondary | ICD-10-CM | POA: Diagnosis present

## 2020-12-17 DIAGNOSIS — E860 Dehydration: Secondary | ICD-10-CM | POA: Diagnosis present

## 2020-12-17 DIAGNOSIS — Z66 Do not resuscitate: Secondary | ICD-10-CM | POA: Diagnosis present

## 2020-12-17 DIAGNOSIS — I4892 Unspecified atrial flutter: Secondary | ICD-10-CM | POA: Diagnosis present

## 2020-12-17 DIAGNOSIS — M47819 Spondylosis without myelopathy or radiculopathy, site unspecified: Secondary | ICD-10-CM | POA: Diagnosis present

## 2020-12-17 DIAGNOSIS — N184 Chronic kidney disease, stage 4 (severe): Secondary | ICD-10-CM | POA: Diagnosis present

## 2020-12-17 DIAGNOSIS — R627 Adult failure to thrive: Secondary | ICD-10-CM | POA: Diagnosis present

## 2020-12-17 DIAGNOSIS — Z20822 Contact with and (suspected) exposure to covid-19: Secondary | ICD-10-CM | POA: Diagnosis present

## 2020-12-17 DIAGNOSIS — I5032 Chronic diastolic (congestive) heart failure: Secondary | ICD-10-CM

## 2020-12-17 DIAGNOSIS — Z6831 Body mass index (BMI) 31.0-31.9, adult: Secondary | ICD-10-CM | POA: Diagnosis not present

## 2020-12-17 DIAGNOSIS — G4733 Obstructive sleep apnea (adult) (pediatric): Secondary | ICD-10-CM | POA: Diagnosis present

## 2020-12-17 DIAGNOSIS — K5730 Diverticulosis of large intestine without perforation or abscess without bleeding: Secondary | ICD-10-CM | POA: Diagnosis present

## 2020-12-17 DIAGNOSIS — Z8673 Personal history of transient ischemic attack (TIA), and cerebral infarction without residual deficits: Secondary | ICD-10-CM | POA: Diagnosis not present

## 2020-12-17 DIAGNOSIS — I4891 Unspecified atrial fibrillation: Secondary | ICD-10-CM | POA: Diagnosis not present

## 2020-12-17 DIAGNOSIS — E538 Deficiency of other specified B group vitamins: Secondary | ICD-10-CM | POA: Diagnosis present

## 2020-12-17 DIAGNOSIS — F32A Depression, unspecified: Secondary | ICD-10-CM | POA: Diagnosis present

## 2020-12-17 DIAGNOSIS — J449 Chronic obstructive pulmonary disease, unspecified: Secondary | ICD-10-CM | POA: Diagnosis present

## 2020-12-17 DIAGNOSIS — M1990 Unspecified osteoarthritis, unspecified site: Secondary | ICD-10-CM | POA: Diagnosis present

## 2020-12-17 LAB — CBC
HCT: 46.5 % — ABNORMAL HIGH (ref 36.0–46.0)
Hemoglobin: 14.1 g/dL (ref 12.0–15.0)
MCH: 31.4 pg (ref 26.0–34.0)
MCHC: 30.3 g/dL (ref 30.0–36.0)
MCV: 103.6 fL — ABNORMAL HIGH (ref 80.0–100.0)
Platelets: 193 10*3/uL (ref 150–400)
RBC: 4.49 MIL/uL (ref 3.87–5.11)
RDW: 14.2 % (ref 11.5–15.5)
WBC: 6.2 10*3/uL (ref 4.0–10.5)
nRBC: 0 % (ref 0.0–0.2)

## 2020-12-17 LAB — URINE CULTURE: Culture: NO GROWTH

## 2020-12-17 LAB — BASIC METABOLIC PANEL
Anion gap: 10 (ref 5–15)
BUN: 21 mg/dL (ref 8–23)
CO2: 24 mmol/L (ref 22–32)
Calcium: 9 mg/dL (ref 8.9–10.3)
Chloride: 106 mmol/L (ref 98–111)
Creatinine, Ser: 1.21 mg/dL — ABNORMAL HIGH (ref 0.44–1.00)
GFR, Estimated: 43 mL/min — ABNORMAL LOW (ref >60)
Glucose, Bld: 93 mg/dL (ref 70–99)
Potassium: 4.5 mmol/L (ref 3.5–5.1)
Sodium: 140 mmol/L (ref 135–145)

## 2020-12-17 MED ORDER — THIAMINE HCL 100 MG PO TABS
100.0000 mg | ORAL_TABLET | Freq: Every day | ORAL | Status: DC
  Administered 2020-12-17 – 2020-12-19 (×3): 100 mg via ORAL
  Filled 2020-12-17 (×3): qty 1

## 2020-12-17 NOTE — NC FL2 (Signed)
[place] LEVEL OF CARE SCREENING TOOL     IDENTIFICATION  Patient Name: Sonya Kaufman Birthdate: 05/22/1931 Sex: female Admission Date (Current Location): 12/15/2020  [facility] and [place] Number:  Herbalist and Address:  [facility],  [place] [address], [place]      Provider Number: [id number]  Attending Physician Name and Address:  Alma Friendly, MD  Relative Name and Phone Number:       Current Level of Care: Hospital Recommended Level of Care: [place] Prior Approval Number:    Date Approved/Denied:   PASRR Number: pending  Discharge Plan: SNF    Current Diagnoses: Patient Active Problem List   Diagnosis Date Noted  . Acute metabolic encephalopathy 73/71/0626  . Subacute confusional state 12/15/2020  . Failure to thrive in adult 12/16/2019  . Acute on chronic diastolic ACC/AHA stage C congestive heart failure ([place]) 03/27/2015  . Chronic diastolic CHF (congestive heart failure), NYHA class 2 ([place]) 04/02/2014  . Permanent atrial fibrillation ([place]) 04/02/2014  . Chest pain with moderate risk for cardiac etiology 04/02/2014  . Precordial pain 04/02/2014  . Respiratory distress, acute 11/04/2013  . COPD exacerbation ([place]) 11/03/2013  . COPD with acute exacerbation ([place]) 11/03/2013  . Renal insufficiency 11/29/2011  . Bradycardia 11/29/2011  . Respiratory failure with hypoxia ([place]) 11/21/2011  . Polydipsia 07/29/2011  . Atrial flutter ([place]) 01/28/2011  . Atrial flutter --RVR 01/11/2011  . Chronic diastolic heart failure ([place]) 12/13/2010  . Acute on chronic renal insufficiency 12/13/2010  . COLONIC POLYPS 08/03/2010  . Atrial fibrillation ([place]) 08/03/2010  . DIVERTICULOSIS OF COLON 08/03/2010  . CONSTIPATION 06/01/2009  . OVERWEIGHT 02/02/2008  . CEREBROVASCULAR DISEASE 02/02/2008  . VENOUS INSUFFICIENCY 02/02/2008  . OSTEOPENIA 02/02/2008  . Hypothyroidism 12/31/2007  . HYPERLIPIDEMIA  12/31/2007  . Depression 12/31/2007  . ASTHMATIC BRONCHITIS, ACUTE 12/31/2007  . OSTEOARTHRITIS 12/31/2007    Orientation RESPIRATION BLADDER Height & Weight     Self,Time,Situation  O2 Incontinent Weight: 186 lb 11.7 oz (84.7 kg) Height:  5\' 7"  (170.2 cm)  BEHAVIORAL SYMPTOMS/MOOD NEUROLOGICAL BOWEL NUTRITION STATUS      Continent Diet (see dc summary)  AMBULATORY STATUS COMMUNICATION OF NEEDS Skin   Total Care Verbally Normal                       Personal Care Assistance Level of Assistance  Bathing,Dressing,Feeding Bathing Assistance: Maximum assistance Feeding assistance: Limited assistance Dressing Assistance: Maximum assistance     Functional Limitations Info  Sight,Speech,Hearing Sight Info: Adequate Hearing Info: Adequate Speech Info: Adequate    SPECIAL CARE FACTORS FREQUENCY  PT (By licensed PT),OT (By licensed OT)     PT Frequency: 5x/week OT Frequency: 5x/week            Contractures Contractures Info: Not present    Additional Factors Info  Code Status,Allergies Code Status Info: DNR Allergies Info: NKA           Current Medications (12/17/2020):  This is the current hospital active medication list Current Facility-Administered Medications  Medication Dose Route Frequency Provider Last Rate Last Admin  . acetaminophen (TYLENOL) tablet 650 mg  650 mg Oral Q6H PRN Kc, Maren Beach, MD   650 mg at 12/16/20 2006   Or  . acetaminophen (TYLENOL) suppository 650 mg  650 mg Rectal Q6H PRN Kc, Ramesh, MD      . buPROPion (WELLBUTRIN XL) 24 hr tablet 450 mg  450 mg Oral Daily Kc,  Maren Beach, MD   450 mg at 12/17/20 1128  . cyanocobalamin ((VITAMIN B-12)) injection 500 mcg  500 mcg Subcutaneous Daily Kc, Ramesh, MD   500 mcg at 12/17/20 1129  . dabigatran (PRADAXA) capsule 75 mg  75 mg Oral BID Kc, Maren Beach, MD   75 mg at 12/17/20 1128  . escitalopram (LEXAPRO) tablet 40 mg  40 mg Oral Daily Kc, Ramesh, MD   40 mg at 12/17/20 1127  . ipratropium-albuterol  (DUONEB) 0.5-2.5 (3) MG/3ML nebulizer solution 3 mL  3 mL Nebulization QHS Kc, Ramesh, MD   3 mL at 12/16/20 1932  . levothyroxine (SYNTHROID) tablet 112 mcg  112 mcg Oral Q0600 Antonieta Pert, MD   112 mcg at 12/17/20 0636  . lithium carbonate (LITHOBID) CR tablet 300 mg  300 mg Oral Daily Kc, Ramesh, MD   300 mg at 12/17/20 1129  . metoprolol tartrate (LOPRESSOR) tablet 25 mg  25 mg Oral BID Kc, Ramesh, MD   25 mg at 12/17/20 1133  . OLANZapine (ZYPREXA) tablet 5 mg  5 mg Oral QHS Kc, Ramesh, MD   5 mg at 12/16/20 2205  . pantoprazole (PROTONIX) EC tablet 40 mg  40 mg Oral Daily Kc, Ramesh, MD   40 mg at 12/17/20 1127  . simvastatin (ZOCOR) tablet 20 mg  20 mg Oral Daily Kc, Ramesh, MD   20 mg at 12/17/20 1129  . thiamine tablet 100 mg  100 mg Oral Daily Lenis Noon, RPH   100 mg at 12/17/20 1128     Discharge Medications: Please see discharge summary for a list of discharge medications.  Relevant Imaging Results:  Relevant Lab Results:   Additional Information ssn: [id number]  Servando Snare, LCSW

## 2020-12-17 NOTE — Progress Notes (Signed)
PROGRESS NOTE    Jenilee Franey  UXN:[id number] DOB: Aug 18, 1931 DOA: 12/15/2020 PCP: Shon Baton, MD   Chief Complaint  Patient presents with  . Altered Mental Status    Brief Narrative: 87 y.o. female with medical history significant for chronic diastolic CHF EF 65 to 25%, CKD stage IV baseline creatinine 1.6-1.8, A. fib/a flutter on Pradaxa, Toprol, history of CVA, venous insufficiency, COPD/asthmatic bronchitis on oxygen as needed, hypothyroidism, OSA, severe depression on Wellbutrin Lexapro lithium and Zyprexa brought to the ED for evaluation of altered mental status difficulty with ambulation. Patient lives with her son who is disabled w/ cerebral palsy and also has a caregiver at home.  Patient has been having progressive change in mental status for past 1 to 2 weeks feeling unwell progressively declining with difficulty ambulation worsening confusion, poor oral intake, family also reporting she has been hallucinating seeing ants and spider. At baseline normal able to walk with a walker but now not able to stand without falling backwards. In the ED, VSS, CT head age-related changes, MRI no acute intracranial abnormality noted, CT spine DJD, chest x-ray evidence of pneumonia, UA with negative nitrite and leuk esterase. Daughter reporting that they have unable to take care of her at home. Pt admitted for further management.    Subjective: Patient seen and examined at bedside, appears very deconditioned, sleepy but easily arousable, was able to tell me her name and birthday.  Denied any chest pain, abdominal pain, nausea/vomiting, worsening shortness of breath, fever/chills.  Patient noted to be hypoxic when working with PT. patient reports using oxygen as needed especially when sleeping at home     Assessment & Plan:  Subacute metabolic encephalopathy/deconditioned/failure to thrive Noted to be deconditioned CT head and MRI brain with no acute finding No evidence of infection on chest  x-ray and UA, chest x-ray pending S/P gentle IV hydration Vitamin B1 level pending, TSH WNL Vitamin B12 deficiency, level 213, continue supplements Continue thiamine supplement PT/OT  Chronic diastolic HF  Appears somewhat euvolemic, BNP 332 was previously 504 EF 65 to 70% on 12/17/2019 Continue to hold Lasix for now Repeat chest x-ray pending Strict I's and O's, daily weights  CKD stage IV Stable Baseline creatinine 1.6-1.8 Continue to hold Lasix.  A. fib/a flutter Rate controlled Continue Pradaxa and Toprol   History of CVA Repeat MRI brain unremarkable PT/OT  COPD/asthmatic bronchitis On oxygen as needed, continue Continue DuoNeb nightly  Hypothyroidism TSH stable Continue Synthroid.    OSA Not on CPAP Continue prn [place] o2  Severe depression Continue Wellbutrin, Lexapro, lithium and Zyprexa Lithium level normal 0.7 She is followed by psychiatry and had televisit last Tuesday  Obesity  Lifestyle modification advised  Goals of care Recently seen by [facility].  Discussed with daughter at the bedside she has a signed DNR form. Supportive measures  Diet Order            DIET SOFT Room service appropriate? Yes; Fluid consistency: Thin  Diet effective now                 Patient's Body mass index is 29.25 kg/m.  DVT prophylaxis: Pradaxa Code Status:   Code Status: DNR  Family Communication: None at bedside, plan to update daughter  Status [place]:HCWCBJSE as inpatient Remains  hospitalized for ongoing dehydration weakness deconditioning  Dispo: The patient is from: Home              Anticipated d/c is to: SNF  Patient currently is not medically stable to d/c.   Difficult to place patient No   Unresulted Labs (From admission, onward)          Start     Ordered   12/16/20 0711  Vitamin B1  Once,   R        12/16/20 8811        Medications reviewed:  Scheduled Meds: . buPROPion  450 mg Oral Daily  . cyanocobalamin  500 mcg  Subcutaneous Daily  . dabigatran  75 mg Oral BID  . escitalopram  40 mg Oral Daily  . ipratropium-albuterol  3 mL Nebulization QHS  . levothyroxine  112 mcg Oral Q0600  . lithium carbonate  300 mg Oral Daily  . metoprolol tartrate  25 mg Oral BID  . OLANZapine  5 mg Oral QHS  . pantoprazole  40 mg Oral Daily  . simvastatin  20 mg Oral Daily  . thiamine  100 mg Oral Daily   Continuous Infusions:   Consultants:see note  Procedures:see note  Antimicrobials: Anti-infectives (From admission, onward)   None     Culture/Microbiology    Component Value Date/Time   SDES  12/15/2020 1555    URINE, RANDOM Performed at [facility], [place] [address], [place], [place] [address]    SPECREQUEST  12/15/2020 1555    NONE Performed at [facility], [place] [address] [place], [place] [address]    CULT  12/15/2020 1555    NO GROWTH Performed at [place] [address], [place], [place] [address]    REPTSTATUS 12/17/2020 FINAL 12/15/2020 1555    Other culture-see note  Objective: Vitals: Today's Vitals   12/16/20 2042 12/16/20 2051 12/17/20 0549 12/17/20 1133  BP: (!) 145/78  139/83 133/71  Pulse: 74  68 (!) 58  Resp: 14  14   Temp: (!) 97.5 F (36.4 C)  97.8 F (36.6 C)   TempSrc: Oral  Oral   SpO2: 99%  97%   Weight:      Height:      PainSc:  Asleep      Intake/Output Summary (Last 24 hours) at 12/17/2020 1421 Last data filed at 12/17/2020 1128 Gross per 24 hour  Intake 1819.97 ml  Output 675 ml  Net 1144.97 ml   Filed Weights   12/15/20 1943  Weight: 84.7 kg   Weight change:   Intake/Output from previous day: 03/19 0701 - 03/20 0700 In: 1460 [P.O.:720; I.V.:740] Out: 675 [Urine:675] Intake/Output this shift: Total I/O In: 600 [P.O.:600] Out: -  Filed Weights   12/15/20 1943  Weight: 84.7 kg    Examination:  General: NAD, deconditioned, AAOx3  Cardiovascular: S1, S2 present  Respiratory:  Diminished  breath sounds bilaterally  Abdomen: Soft, nontender, nondistended, bowel sounds present  Musculoskeletal: No bilateral pedal edema noted  Skin: Normal  Psychiatry: Normal mood    Data Reviewed: I have personally reviewed following labs and imaging studies CBC: Recent Labs  Lab 12/15/20 1410 12/16/20 0547 12/17/20 0100  WBC 5.9 5.5 6.2  NEUTROABS 3.7  --   --   HGB 15.3* 14.5 14.1  HCT 50.3* 47.8* 46.5*  MCV 102.4* 104.1* 103.6*  PLT 184 168 628   Basic Metabolic Panel: Recent Labs  Lab 12/15/20 1410 12/16/20 0547 12/17/20 0100  NA 141 140 140  K 4.5 4.6 4.5  CL 105 104 106  CO2 28 28 24   GLUCOSE 97 91 93  BUN 26* 23 21  CREATININE 1.43* 1.44* 1.21*  CALCIUM 9.0 9.0 9.0   GFR: Estimated Creatinine Clearance: 36.6 mL/min (A) (by C-G formula based on SCr of 1.21 mg/dL (H)). Liver Function Tests: Recent Labs  Lab 12/15/20 1410 12/16/20 0547  AST 17 17  ALT 13 10  ALKPHOS 79 66  BILITOT 0.3 0.9  PROT 6.5 5.8*  ALBUMIN 3.5 3.1*   No results for input(s): LIPASE, AMYLASE in the last 168 hours. Recent Labs  Lab 12/15/20 2007  AMMONIA 21   Coagulation Profile: No results for input(s): INR, PROTIME in the last 168 hours. Cardiac Enzymes: Recent Labs  Lab 12/15/20 1410  CKTOTAL 29*   BNP (last 3 results) No results for input(s): PROBNP in the last 8760 hours. HbA1C: No results for input(s): HGBA1C in the last 72 hours. CBG: No results for input(s): GLUCAP in the last 168 hours. Lipid Profile: No results for input(s): CHOL, HDL, LDLCALC, TRIG, CHOLHDL, LDLDIRECT in the last 72 hours. Thyroid Function Tests: Recent Labs    12/15/20 2007  TSH 1.386   Anemia Panel: Recent Labs    12/15/20 2007  VITAMINB12 213   Sepsis Labs: No results for input(s): PROCALCITON, LATICACIDVEN in the last 168 hours.  Recent Results (from the past 240 hour(s))  Urine culture     Status: None   Collection Time: 12/15/20  3:55 PM   Specimen: Urine, Random   Result Value Ref Range Status   Specimen Description   Final    URINE, RANDOM Performed at [place] [address] [place], [place] [address]    Special Requests   Final    NONE Performed at [facility], [place] [address] [place], [place] [address]    Culture   Final    NO GROWTH Performed at [facility], [place] [address], [place], [place] [address]    Report Status 12/17/2020 FINAL  Final  SARS CORONAVIRUS 2 (TAT 6-24 HRS) Nasopharyngeal Nasopharyngeal Swab     Status: None   Collection Time: 12/15/20  6:46 PM   Specimen: Nasopharyngeal Swab  Result Value Ref Range Status   SARS Coronavirus 2 NEGATIVE NEGATIVE Final    Comment: (NOTE) SARS-CoV-2 target nucleic acids are NOT DETECTED.  The SARS-CoV-2 RNA is generally detectable in upper and lower respiratory specimens during the acute phase of infection. Negative results do not preclude SARS-CoV-2 infection, do not rule out co-infections with other pathogens, and should not be used as the sole basis for treatment or other patient management decisions. Negative results must be combined with clinical observations, patient history, and epidemiological information. The expected result is Negative.  Fact Sheet for Patients: SugarRoll.be  Fact Sheet for Healthcare Providers: https://www.woods-mathews.com/  This test is not yet approved or cleared by the [place] FDA and  has been authorized for detection and/or diagnosis of SARS-CoV-2 by FDA under an Emergency Use Authorization (EUA). This EUA will remain  in effect (meaning this test can be used) for the duration of the COVID-19 declaration under Se ction 564(b)(1) of the Act, 21 U.S.C. section 360bbb-3(b)(1), unless the authorization is terminated or revoked sooner.  Performed at [facility], [place] [address], [place], [place] [address]      Radiology Studies: CT Head Wo  Contrast  Result Date: 12/15/2020 CLINICAL DATA:  Altered mental status EXAM: CT HEAD WITHOUT CONTRAST CT CERVICAL SPINE WITHOUT CONTRAST TECHNIQUE: Multidetector CT imaging of the head and cervical spine was performed following the standard protocol without  intravenous contrast. Multiplanar CT image reconstructions of the cervical spine were also generated. COMPARISON:  Head CT December 16, 2019. FINDINGS: CT HEAD FINDINGS Brain: No evidence of acute large vascular territory infarction, hemorrhage, hydrocephalus, extra-axial collection or mass lesion/mass effect. Similar mild burden chronic microvascular ischemic white matter disease. Mild age related global parenchymal volume loss with ex vacuo dilatation of ventricular system, stable. Vascular: No hyperdense vessel. Atherosclerotic calcifications of the intracranial portions of the internal carotid and vertebral arteries. Skull: Benign hyperostosis frontalis. Negative for fracture or focal lesion. Sinuses/Orbits: Paranasal sinuses are predominantly clear. Scattered opacification of the mastoid air cells. Orbits are unremarkable. Other: None CT CERVICAL SPINE FINDINGS Alignment: There is some reversal of the normal cervical lordosis. Grade 1 degenerative anterolisthesis of C4 on C5. Skull base and vertebrae: No acute fracture. No primary bone lesion or focal pathologic process. Soft tissues and spinal canal: No prevertebral fluid or swelling. No visible canal hematoma. Disc levels: Moderate multilevel degenerative changes spine with disc space narrowing osteophytosis and uncovertebral/facet hypertrophy. Upper chest: Evaluation degraded by respiratory motion, no overt abnormality. Other: None IMPRESSION: 1. No acute intracranial abnormality. 2. Similar mild age related global parenchymal volume loss and mild burden chronic microvascular ischemic white matter disease. 3. No evidence of acute fracture or traumatic malalignment of the cervical spine. 4. Moderate  multilevel degenerative changes of the cervical spine. Electronically Signed   By: Dahlia Bailiff MD   On: 12/15/2020 16:37   CT Cervical Spine Wo Contrast  Result Date: 12/15/2020 CLINICAL DATA:  Altered mental status EXAM: CT HEAD WITHOUT CONTRAST CT CERVICAL SPINE WITHOUT CONTRAST TECHNIQUE: Multidetector CT imaging of the head and cervical spine was performed following the standard protocol without intravenous contrast. Multiplanar CT image reconstructions of the cervical spine were also generated. COMPARISON:  Head CT December 16, 2019. FINDINGS: CT HEAD FINDINGS Brain: No evidence of acute large vascular territory infarction, hemorrhage, hydrocephalus, extra-axial collection or mass lesion/mass effect. Similar mild burden chronic microvascular ischemic white matter disease. Mild age related global parenchymal volume loss with ex vacuo dilatation of ventricular system, stable. Vascular: No hyperdense vessel. Atherosclerotic calcifications of the intracranial portions of the internal carotid and vertebral arteries. Skull: Benign hyperostosis frontalis. Negative for fracture or focal lesion. Sinuses/Orbits: Paranasal sinuses are predominantly clear. Scattered opacification of the mastoid air cells. Orbits are unremarkable. Other: None CT CERVICAL SPINE FINDINGS Alignment: There is some reversal of the normal cervical lordosis. Grade 1 degenerative anterolisthesis of C4 on C5. Skull base and vertebrae: No acute fracture. No primary bone lesion or focal pathologic process. Soft tissues and spinal canal: No prevertebral fluid or swelling. No visible canal hematoma. Disc levels: Moderate multilevel degenerative changes spine with disc space narrowing osteophytosis and uncovertebral/facet hypertrophy. Upper chest: Evaluation degraded by respiratory motion, no overt abnormality. Other: None IMPRESSION: 1. No acute intracranial abnormality. 2. Similar mild age related global parenchymal volume loss and mild burden  chronic microvascular ischemic white matter disease. 3. No evidence of acute fracture or traumatic malalignment of the cervical spine. 4. Moderate multilevel degenerative changes of the cervical spine. Electronically Signed   By: Dahlia Bailiff MD   On: 12/15/2020 16:37   MR Brain Wo Contrast (neuro protocol)  Result Date: 12/16/2020 CLINICAL DATA:  Mental status changes of unknown cause. EXAM: MRI HEAD WITHOUT CONTRAST TECHNIQUE: Multiplanar, multiecho pulse sequences of the brain and surrounding structures were obtained without intravenous contrast. COMPARISON:  Head CT yesterday. FINDINGS: Brain: Diffusion imaging does not show any acute or subacute  infarction or other cause of restricted diffusion. There are chronic small-vessel ischemic changes of the pons, worse on the right. No focal cerebellar insult. Cerebral hemispheres show an old infarction in the left thalamus, old small vessel infarctions of the basal ganglia and moderate to marked chronic small-vessel ischemic changes of the cerebral hemispheric white matter. No cortical or large vessel territory infarction. No mass lesion, hemorrhage, hydrocephalus or extra-axial collection. Vascular: Major vessels at the base of the brain show flow. Skull and upper cervical spine: Negative Sinuses/Orbits: Clear/normal Other: None IMPRESSION: No acute or reversible finding. Chronic small-vessel ischemic changes throughout the brain as outlined above. Electronically Signed   By: Nelson Chimes M.D.   On: 12/16/2020 10:17   DG Chest Port 1 View  Result Date: 12/15/2020 CLINICAL DATA:  Altered mental status. EXAM: PORTABLE CHEST 1 VIEW COMPARISON:  December 16, 2019. FINDINGS: Stable cardiomegaly. No pneumothorax or pleural effusion is noted. Mild right upper lobe subsegmental atelectasis is noted. Left lung is clear. Bony thorax is unremarkable. IMPRESSION: Mild right upper lobe subsegmental atelectasis. Electronically Signed   By: Marijo Conception M.D.   On:  12/15/2020 16:45     LOS: 0 days   Alma Friendly, MD [facility]  12/17/2020, 2:21 PM

## 2020-12-17 NOTE — Progress Notes (Signed)
PHARMACIST - PHYSICIAN COMMUNICATION  DR:   Horris Latino  CONCERNING: IV to Oral Route Change Policy  RECOMMENDATION: This patient is receiving thiamine by the intravenous route.  Based on criteria approved by the Pharmacy and Therapeutics Committee, the intravenous medication(s) is/are being converted to the equivalent oral dose form(s).   DESCRIPTION: These criteria include:  The patient is eating (either orally or via tube) and/or has been taking other orally administered medications for a least 24 hours  The patient has no evidence of active gastrointestinal bleeding or impaired GI absorption (gastrectomy, short bowel, patient on TNA or NPO).  If you have questions about this conversion, please contact the Pharmacy Department  []   [phone] )  [facility] []   [phone] )  [facility] []   [phone] )  [facility] []   [phone] )  [facility] [x]   [phone] )  [place], [facility] 12/17/2020 8:57 AM

## 2020-12-17 NOTE — TOC Initial Note (Signed)
Transition of Care [facility]) - Initial/Assessment Note    Patient Details  Name: Sonya Kaufman MRN: [id number] Date of Birth: August 26, 1931  Transition of Care [facility]) CM/SW Contact:    Servando Snare, LCSW Phone Number: 12/17/2020, 3:46 PM  Clinical Narrative:         Patient from home with special needs son who requires 24/7 care. Patient and son have been receiving 24/7 care from [place] daughters Sonya Kaufman and Sonya Kaufman. Daughters report that patient baseline has declined in the past few weeks. At baseline daughters report patient is able to use rollator to ambulate and [place] the potty chair. Family is agreeable to SNF for rehab.           Expected Discharge Plan: Skilled Nursing Facility Barriers to Discharge: Continued Medical Work up   Patient Goals and CMS Choice Patient states their goals for this hospitalization and ongoing recovery are:: Go home CMS Medicare.gov Compare Post Acute Care list provided to:: Patient Choice offered to / list presented to : Adult Children,Patient  Expected Discharge Plan and Services Expected Discharge Plan: [place] In-house Referral: NA Discharge Planning Services: NA Post Acute Care Choice: [place] Living arrangements for the past 2 months: Single Family Home                 DME Arranged: N/A DME Agency: NA       HH Arranged: NA HH Agency: NA        Prior Living Arrangements/Services Living arrangements for the past 2 months: Single Family Home Lives with:: Adult Children Patient language and need for interpreter reviewed:: Yes Do you feel safe going back to the place where you live?: Yes      Need for Family Participation in Patient Care: Yes (Comment) Care giver support system in place?: Yes (comment) Current home services: DME Criminal Activity/Legal Involvement Pertinent to Current Situation/Hospitalization: No - Comment as needed  Activities of Daily Living Home Assistive Devices/Equipment: Walker (specify  type),Wheelchair,Cane (specify quad or straight),Dentures (specify type),Bedside commode/3-in-1,Shower chair with back,Eyeglasses,Raised toilet seat with rails (lower partial plate) ADL Screening (condition at time of admission) Patient's cognitive ability adequate to safely complete daily activities?: No Is the patient deaf or have difficulty hearing?: Yes Does the patient have difficulty seeing, even when wearing glasses/contacts?: No Does the patient have difficulty concentrating, remembering, or making decisions?: Yes Patient able to express need for assistance with ADLs?: Yes Does the patient have difficulty dressing or bathing?: Yes Independently performs ADLs?: No Communication: Independent Dressing (OT): Needs assistance Is this a change from baseline?: Pre-admission baseline Grooming: Needs assistance Is this a change from baseline?: Pre-admission baseline Feeding: Needs assistance Is this a change from baseline?: Pre-admission baseline Bathing: Needs assistance Is this a change from baseline?: Pre-admission baseline Toileting: Needs assistance Is this a change from baseline?: Pre-admission baseline In/Out Bed: Needs assistance Is this a change from baseline?: Pre-admission baseline Walks in Home: Dependent Is this a change from baseline?: Pre-admission baseline Does the patient have difficulty walking or climbing stairs?: Yes Weakness of Legs: Both Weakness of Arms/Hands: Both  Permission Sought/Granted Permission sought to share information with : Family Supports Permission granted to share information with : Yes, Verbal Permission Granted  Share Information with NAME: Sonya Kaufman (HCPOA) and Sonya Kaufman     Permission granted to share info w Relationship: daughters     Emotional Assessment Appearance:: Appears stated age Attitude/Demeanor/Rapport: Unable to Assess Affect (typically observed): Unable to Assess Orientation: : Oriented to Self,Oriented to Place,Oriented to  Time,Oriented to Situation Alcohol / Substance Use: Not Applicable Psych Involvement: No (comment)  Admission diagnosis:  Delirium [R41.0] Subacute confusional state [E49.7] Acute metabolic encephalopathy [N30.05] Patient Active Problem List   Diagnosis Date Noted  . Acute metabolic encephalopathy 08/01/1116  . Subacute confusional state 12/15/2020  . Failure to thrive in adult 12/16/2019  . Acute on chronic diastolic ACC/AHA stage C congestive heart failure ([place]) 03/27/2015  . Chronic diastolic CHF (congestive heart failure), NYHA class 2 ([place]) 04/02/2014  . Permanent atrial fibrillation ([place]) 04/02/2014  . Chest pain with moderate risk for cardiac etiology 04/02/2014  . Precordial pain 04/02/2014  . Respiratory distress, acute 11/04/2013  . COPD exacerbation ([place]) 11/03/2013  . COPD with acute exacerbation ([place]) 11/03/2013  . Renal insufficiency 11/29/2011  . Bradycardia 11/29/2011  . Respiratory failure with hypoxia ([place]) 11/21/2011  . Polydipsia 07/29/2011  . Atrial flutter ([place]) 01/28/2011  . Atrial flutter --RVR 01/11/2011  . Chronic diastolic heart failure ([place]) 12/13/2010  . Acute on chronic renal insufficiency 12/13/2010  . COLONIC POLYPS 08/03/2010  . Atrial fibrillation ([place]) 08/03/2010  . DIVERTICULOSIS OF COLON 08/03/2010  . CONSTIPATION 06/01/2009  . OVERWEIGHT 02/02/2008  . CEREBROVASCULAR DISEASE 02/02/2008  . VENOUS INSUFFICIENCY 02/02/2008  . OSTEOPENIA 02/02/2008  . Hypothyroidism 12/31/2007  . HYPERLIPIDEMIA 12/31/2007  . Depression 12/31/2007  . ASTHMATIC BRONCHITIS, ACUTE 12/31/2007  . OSTEOARTHRITIS 12/31/2007   PCP:  Shon Baton, MD Pharmacy:   [place], [place] - [address] [place] [place] [address] Phone: [phone] Fax: [phone]     Social Determinants of Health (SDOH) Interventions    Readmission Risk Interventions No flowsheet data found.

## 2020-12-18 LAB — CBC WITH DIFFERENTIAL/PLATELET
Abs Immature Granulocytes: 0.01 10*3/uL (ref 0.00–0.07)
Basophils Absolute: 0 10*3/uL (ref 0.0–0.1)
Basophils Relative: 0 %
Eosinophils Absolute: 0.1 10*3/uL (ref 0.0–0.5)
Eosinophils Relative: 3 %
HCT: 44.8 % (ref 36.0–46.0)
Hemoglobin: 13.4 g/dL (ref 12.0–15.0)
Immature Granulocytes: 0 %
Lymphocytes Relative: 28 %
Lymphs Abs: 1.5 10*3/uL (ref 0.7–4.0)
MCH: 31.2 pg (ref 26.0–34.0)
MCHC: 29.9 g/dL — ABNORMAL LOW (ref 30.0–36.0)
MCV: 104.4 fL — ABNORMAL HIGH (ref 80.0–100.0)
Monocytes Absolute: 0.6 10*3/uL (ref 0.1–1.0)
Monocytes Relative: 11 %
Neutro Abs: 3.1 10*3/uL (ref 1.7–7.7)
Neutrophils Relative %: 58 %
Platelets: 146 10*3/uL — ABNORMAL LOW (ref 150–400)
RBC: 4.29 MIL/uL (ref 3.87–5.11)
RDW: 14.6 % (ref 11.5–15.5)
WBC: 5.3 10*3/uL (ref 4.0–10.5)
nRBC: 0 % (ref 0.0–0.2)

## 2020-12-18 LAB — VITAMIN B1

## 2020-12-18 LAB — BASIC METABOLIC PANEL
Anion gap: 6 (ref 5–15)
BUN: 19 mg/dL (ref 8–23)
CO2: 27 mmol/L (ref 22–32)
Calcium: 9.1 mg/dL (ref 8.9–10.3)
Chloride: 108 mmol/L (ref 98–111)
Creatinine, Ser: 1.42 mg/dL — ABNORMAL HIGH (ref 0.44–1.00)
GFR, Estimated: 36 mL/min — ABNORMAL LOW (ref >60)
Glucose, Bld: 89 mg/dL (ref 70–99)
Potassium: 4.9 mmol/L (ref 3.5–5.1)
Sodium: 141 mmol/L (ref 135–145)

## 2020-12-18 MED ORDER — ENSURE ENLIVE PO LIQD: 237.0000 mL | Freq: Two times a day (BID) | ORAL | Status: DC

## 2020-12-18 NOTE — TOC Progression Note (Signed)
Transition of Care [facility]) - Progression Note    Patient Details  Name: Sonya Kaufman MRN: [id number] Date of Birth: 08/06/1931  Transition of Care [facility]) CM/SW Contact  Ceirra Belli, Marjie Skiff, RN Phone Number: 12/18/2020, 3:36 PM  Clinical Narrative:    Daughter Shirlean Mylar has chosen [facility]. [facility] liaison contacted to accept bed. [facility] also contacted to alert of bed choice. Candace Cruise approved SNF. Auth # [id number]. MD alerted of need for covid test prior to dc.  Expected Discharge Plan: [place] Barriers to Discharge: Continued Medical Work up  Expected Discharge Plan and Services Expected Discharge Plan: [place] In-house Referral: NA Discharge Planning Services: NA Post Acute Care Choice: [place] Living arrangements for the past 2 months: Single Family Home                 DME Arranged: N/A DME Agency: NA       HH Arranged: NA HH Agency: NA         Social Determinants of Health (SDOH) Interventions    Readmission Risk Interventions No flowsheet data found.

## 2020-12-18 NOTE — Progress Notes (Signed)
PROGRESS NOTE    Sonya Kaufman  URK:[id number] DOB: March 08, 1931 DOA: 12/15/2020 PCP: Shon Baton, MD   Chief Complaint  Patient presents with  . Altered Mental Status    Brief Narrative: 87 y.o. female with medical history significant for chronic diastolic CHF EF 65 to 83%, CKD stage IV baseline creatinine 1.6-1.8, A. fib/a flutter on Pradaxa, Toprol, history of CVA, venous insufficiency, COPD/asthmatic bronchitis on oxygen as needed, hypothyroidism, OSA, severe depression on Wellbutrin Lexapro lithium and Zyprexa brought to the ED for evaluation of altered mental status difficulty with ambulation. Patient lives with her son who is disabled w/ cerebral palsy and also has a caregiver at home.  Patient has been having progressive change in mental status for past 1 to 2 weeks feeling unwell progressively declining with difficulty ambulation worsening confusion, poor oral intake, family also reporting she has been hallucinating seeing ants and spider. At baseline normal able to walk with a walker but now not able to stand without falling backwards. In the ED, VSS, CT head age-related changes, MRI no acute intracranial abnormality noted, CT spine DJD, chest x-ray evidence of pneumonia, UA with negative nitrite and leuk esterase. Daughter reporting that they have unable to take care of her at home. Pt admitted for further management.    Subjective: Patient seen and examined at bedside, noted to be awake, alert, oriented to self and place.  Appears weak and deconditioned, denies any chest pain, shortness of breath, abdominal pain, nausea/vomiting, fever/chills.     Assessment & Plan:  Subacute metabolic encephalopathy/deconditioned/failure to thrive Noted to be deconditioned UA/UC no growth, BC x2 NGTD CT head and MRI brain with no acute finding No evidence of infection on chest x-ray S/P gentle IV hydration Vitamin B1 level pending, TSH WNL Vitamin B12 deficiency, level 213, continue  supplements Continue thiamine supplement PT/OT  Chronic diastolic HF  Appears somewhat euvolemic, BNP 332 was previously 504 EF 65 to 70% on 12/17/2019 Continue to hold Lasix for now Repeat chest x-ray on 12/17/2020 showed mild atelectasis of bilateral lung bases, no focal pneumonia Strict I's and O's, daily weights  CKD stage IV Stable Baseline creatinine 1.6-1.8 Continue to hold Lasix.  A. fib/a flutter Rate controlled Continue Pradaxa and Toprol   History of CVA Repeat MRI brain unremarkable PT/OT  COPD/asthmatic bronchitis On oxygen as needed, continue Continue DuoNeb nightly  Hypothyroidism TSH stable Continue Synthroid.    OSA Not on CPAP Continue prn [place] o2  Severe depression Continue Wellbutrin, Lexapro, lithium and Zyprexa Lithium level normal 0.7 She is followed by psychiatry and had televisit last Tuesday  Goals of care Recently seen by [facility] DNR Supportive measures    Patient's Body mass index is 29.25 kg/m.  DVT prophylaxis: Pradaxa Code Status:   Code Status: DNR  Family Communication: None at bedside, plan to update daughter  Status TD:[id number] as inpatient Remains  hospitalized for ongoing dehydration weakness deconditioning  Dispo: The patient is from: Home              Anticipated d/c is to: SNF              Patient currently is not medically stable to d/c.   Difficult to place patient No   Unresulted Labs (From admission, onward)          Start     Ordered   12/18/20 1534  SARS CORONAVIRUS 2 (TAT 6-24 HRS) Nasopharyngeal Nasopharyngeal Swab  Once,   R       Question  Answer Comment  Is this test for diagnosis or screening Screening   Symptomatic for COVID-19 as defined by CDC No   Hospitalized for COVID-19 No   Admitted to ICU for COVID-19 No   Previously tested for COVID-19 Yes   Resident in a congregate (group) care setting No   Employed in healthcare setting No   Pregnant No   Has patient completed COVID  vaccination(s) (2 doses of Pfizer/Moderna 1 dose of [employer]) Unknown      12/18/20 1533   12/18/20 0500  CBC with Differential/Platelet  Daily,   R     Question:  Specimen collection method  Answer:  Lab=Lab collect   12/17/20 1452   12/18/20 7408  Basic metabolic panel  Daily,   R     Question:  Specimen collection method  Answer:  Lab=Lab collect   12/17/20 1452        Medications reviewed:  Scheduled Meds: . buPROPion  450 mg Oral Daily  . dabigatran  75 mg Oral BID  . escitalopram  40 mg Oral Daily  . ipratropium-albuterol  3 mL Nebulization QHS  . levothyroxine  112 mcg Oral Q0600  . lithium carbonate  300 mg Oral Daily  . metoprolol tartrate  25 mg Oral BID  . OLANZapine  5 mg Oral QHS  . pantoprazole  40 mg Oral Daily  . simvastatin  20 mg Oral Daily  . thiamine  100 mg Oral Daily   Continuous Infusions:   Consultants:see note  Procedures:see note  Antimicrobials: Anti-infectives (From admission, onward)   None     Culture/Microbiology    Component Value Date/Time   SDES  12/15/2020 1555    URINE, RANDOM Performed at [facility], [place] [address], [place], [place] [address]    SPECREQUEST  12/15/2020 1555    NONE Performed at [facility], [place] [address], [place], [place] [address]    CULT  12/15/2020 1555    NO GROWTH Performed at [place] [address], [place], [place] [address]    REPTSTATUS 12/17/2020 FINAL 12/15/2020 1555    Other culture-see note  Objective: Vitals: Today's Vitals   12/17/20 2248 12/17/20 2310 12/18/20 0511 12/18/20 1342  BP:   (!) 144/82 118/71  Pulse:   80 62  Resp:   14 18  Temp:   98 F (36.7 C) (!) 97.4 F (36.3 C)  TempSrc:   Oral Oral  SpO2:   99% 97%  Weight:      Height:      PainSc: 9  Asleep      Intake/Output Summary (Last 24 hours) at 12/18/2020 1559 Last data filed at 12/18/2020 1300 Gross per 24 hour  Intake 714 ml  Output 200 ml  Net 514  ml   Filed Weights   12/15/20 1943  Weight: 84.7 kg   Weight change:   Intake/Output from previous day: 03/20 0701 - 03/21 0700 In: 960 [P.O.:960] Out: 200 [Urine:200] Intake/Output this shift: Total I/O In: 594 [P.O.:594] Out: -  Filed Weights   12/15/20 1943  Weight: 84.7 kg    Examination:  General: NAD, deconditioned, AAOx3  Cardiovascular: S1, S2 present  Respiratory:  Diminished breath sounds bilaterally  Abdomen: Soft, nontender, nondistended, bowel sounds present  Musculoskeletal: No bilateral pedal edema noted  Skin: Normal  Psychiatry: Normal mood    Data Reviewed: I have personally reviewed following labs and imaging studies CBC: Recent Labs  Lab 12/15/20 1410  12/16/20 0547 12/17/20 0100 12/18/20 0736  WBC 5.9 5.5 6.2 5.3  NEUTROABS 3.7  --   --  3.1  HGB 15.3* 14.5 14.1 13.4  HCT 50.3* 47.8* 46.5* 44.8  MCV 102.4* 104.1* 103.6* 104.4*  PLT 184 168 193 209*   Basic Metabolic Panel: Recent Labs  Lab 12/15/20 1410 12/16/20 0547 12/17/20 0100 12/18/20 0736  NA 141 140 140 141  K 4.5 4.6 4.5 4.9  CL 105 104 106 108  CO2 28 28 24 27   GLUCOSE 97 91 93 89  BUN 26* 23 21 19   CREATININE 1.43* 1.44* 1.21* 1.42*  CALCIUM 9.0 9.0 9.0 9.1   GFR: Estimated Creatinine Clearance: 31.2 mL/min (A) (by C-G formula based on SCr of 1.42 mg/dL (H)). Liver Function Tests: Recent Labs  Lab 12/15/20 1410 12/16/20 0547  AST 17 17  ALT 13 10  ALKPHOS 79 66  BILITOT 0.3 0.9  PROT 6.5 5.8*  ALBUMIN 3.5 3.1*   No results for input(s): LIPASE, AMYLASE in the last 168 hours. Recent Labs  Lab 12/15/20 2007  AMMONIA 21   Coagulation Profile: No results for input(s): INR, PROTIME in the last 168 hours. Cardiac Enzymes: Recent Labs  Lab 12/15/20 1410  CKTOTAL 29*   BNP (last 3 results) No results for input(s): PROBNP in the last 8760 hours. HbA1C: No results for input(s): HGBA1C in the last 72 hours. CBG: No results for input(s): GLUCAP in  the last 168 hours. Lipid Profile: No results for input(s): CHOL, HDL, LDLCALC, TRIG, CHOLHDL, LDLDIRECT in the last 72 hours. Thyroid Function Tests: Recent Labs    12/15/20 2007  TSH 1.386   Anemia Panel: Recent Labs    12/15/20 2007  VITAMINB12 213   Sepsis Labs: No results for input(s): PROCALCITON, LATICACIDVEN in the last 168 hours.  Recent Results (from the past 240 hour(s))  Urine culture     Status: None   Collection Time: 12/15/20  3:55 PM   Specimen: Urine, Random  Result Value Ref Range Status   Specimen Description   Final    URINE, RANDOM Performed at [facility] [address], [place], [place] [address]    Special Requests   Final    NONE Performed at [facility], [place] [address], [place], [place] [address]    Culture   Final    NO GROWTH Performed at [facility], [place] [address], [place], [place] [address]    Report Status 12/17/2020 FINAL  Final  SARS CORONAVIRUS 2 (TAT 6-24 HRS) Nasopharyngeal Nasopharyngeal Swab     Status: None   Collection Time: 12/15/20  6:46 PM   Specimen: Nasopharyngeal Swab  Result Value Ref Range Status   SARS Coronavirus 2 NEGATIVE NEGATIVE Final    Comment: (NOTE) SARS-CoV-2 target nucleic acids are NOT DETECTED.  The SARS-CoV-2 RNA is generally detectable in upper and lower respiratory specimens during the acute phase of infection. Negative results do not preclude SARS-CoV-2 infection, do not rule out co-infections with other pathogens, and should not be used as the sole basis for treatment or other patient management decisions. Negative results must be combined with clinical observations, patient history, and epidemiological information. The expected result is Negative.  Fact Sheet for Patients: SugarRoll.be  Fact Sheet for Healthcare Providers: https://www.woods-mathews.com/  This test is not yet approved or cleared by the  [place] FDA and  has been authorized for detection and/or diagnosis of SARS-CoV-2 by FDA under an Emergency Use Authorization (  EUA). This EUA will remain  in effect (meaning this test can be used) for the duration of the COVID-19 declaration under Se ction 564(b)(1) of the Act, 21 U.S.C. section 360bbb-3(b)(1), unless the authorization is terminated or revoked sooner.  Performed at [facility], [place] [address] [place], [place] [address]      Radiology Studies: DG Chest Port 1 View  Result Date: 12/17/2020 CLINICAL DATA:  Shortness of breath today EXAM: PORTABLE CHEST 1 VIEW COMPARISON:  December 15, 2020 FINDINGS: The heart size and mediastinal contours are stable. The heart size is mildly enlarged. Mild atelectasis of bilateral lung bases are identified. There is no focal infiltrate, pulmonary edema, or pleural effusion. The visualized skeletal structures are stable. IMPRESSION: Mild atelectasis of bilateral lung bases.  No focal pneumonia. Electronically Signed   By: Abelardo Diesel M.D.   On: 12/17/2020 15:12     LOS: 1 day   Alma Friendly, MD [facility]  12/18/2020, 3:59 PM

## 2020-12-18 NOTE — Progress Notes (Signed)
Transition of Care (TOC) -30 day Note       Patient Details  Name: Sonya Kaufman  MRN: [id number] Date of Birth: [id number]     MUST ID: [id number]   To Whom it May Concern:   Please be advised that the above patient will require a short-term nursing home stay, anticipated 30 days or less rehabilitation and strengthening. The plan is for return home.

## 2020-12-18 NOTE — TOC Progression Note (Addendum)
Transition of Care [facility]) - Progression Note    Patient Details  Name: Sonya Kaufman MRN: [id number] Date of Birth: 28-Nov-1930  Transition of [facility]) CM/SW Contact  Ziare Orrick, Marjie Skiff, RN Phone Number: 12/18/2020, 2:13 PM  Clinical Narrative:    FL2 faxed out to area SNF facilities. Additional information sent to [employer] as requested. [employer] received [phone].   Daughter Shirlean Mylar contacted via phone and provided with SNF bed offers. Robin to call back with SNF choice after going over with her sister.  Expected Discharge Plan: [place] Barriers to Discharge: Continued Medical Work up  Expected Discharge Plan and Services Expected Discharge Plan: [place] In-house Referral: NA Discharge Planning Services: NA Post Acute Care Choice: [place] Living arrangements for the past 2 months: Single Family Home                 DME Arranged: N/A DME Agency: NA       HH Arranged: NA HH Agency: NA         Social Determinants of Health (SDOH) Interventions    Readmission Risk Interventions No flowsheet data found.

## 2020-12-18 NOTE — Progress Notes (Signed)
Initial Nutrition Assessment  DOCUMENTATION CODES:   Not applicable  INTERVENTION:    Ensure Enlive po BID, each supplement provides 350 kcal and 20 grams of protein  NUTRITION DIAGNOSIS:   Inadequate oral intake related to decreased appetite as evidenced by per patient/family report.  GOAL:   Patient will meet greater than or equal to 90% of their needs  MONITOR:   PO intake,Supplement acceptance  REASON FOR ASSESSMENT:   Malnutrition Screening Tool    ASSESSMENT:   87 yo female admitted with progressively worsening AMS. PMH includes CHF, CKD stage IV, A fib/flutter, CVA, venous insufficiency, COPD.   Per chart review, patient has had poor oral intake at home for 1-2 weeks.   Usual weights reviewed. Last weight in EMR is from one year ago, 90.1 kg. Currently 84.7 kg. 6% weight loss within one year is not severe.   Patient has been on a soft diet since admission. Meal intakes documented at 50-100%, average 77% x 7 meals.  Labs reviewed. Medications reviewed and include thiamine, B-12 injection daily x 3 since admission.  NUTRITION - FOCUSED PHYSICAL EXAM:  unable to complete  Diet Order:   Diet Order            DIET SOFT Room service appropriate? Yes; Fluid consistency: Thin  Diet effective now                 EDUCATION NEEDS:   No education needs have been identified at this time  Skin:  Skin Assessment: Reviewed RN Assessment  Last BM:  unknown  Height:   Ht Readings from Last 1 Encounters:  12/15/20 5\' 7"  (1.702 m)    Weight:   Wt Readings from Last 1 Encounters:  12/15/20 84.7 kg    Ideal Body Weight:  61.4 kg  BMI:  Body mass index is 29.25 kg/m.  Estimated Nutritional Needs:   Kcal:  1700-1900  Protein:  100-110 gm  Fluid:  1.6-1.8 L    Lucas Mallow, RD, LDN, CNSC Please refer to Amion for contact information.

## 2020-12-19 DIAGNOSIS — I4821 Permanent atrial fibrillation: Secondary | ICD-10-CM

## 2020-12-19 DIAGNOSIS — I679 Cerebrovascular disease, unspecified: Secondary | ICD-10-CM

## 2020-12-19 LAB — SARS CORONAVIRUS 2 (TAT 6-24 HRS): SARS Coronavirus 2: NEGATIVE

## 2020-12-19 MED ORDER — ENSURE ENLIVE PO LIQD: 237.0000 mL | Freq: Two times a day (BID) | ORAL | 0 refills | Status: AC

## 2020-12-19 MED ORDER — THIAMINE HCL 100 MG PO TABS: 100.0000 mg | ORAL_TABLET | Freq: Every day | ORAL | Status: AC

## 2020-12-19 NOTE — Progress Notes (Signed)
Report given to Walker Kehr at [facility].

## 2020-12-19 NOTE — Discharge Summary (Signed)
Discharge Summary  Sonya Kaufman QVZ:[id number] DOB: [birth year]  PCP: Shon Baton, MD  Admit date: 12/15/2020 Discharge date: 12/19/2020  Time spent: 40 mins  Recommendations for Outpatient Follow-up:  1. Follow-up with PCP  Discharge Diagnoses:  Active Hospital Problems   Diagnosis Date Noted  . Acute metabolic encephalopathy 32/95/1884  . Subacute confusional state 12/15/2020  . Permanent atrial fibrillation ([facility]) 04/02/2014  . Chronic diastolic heart failure ([facility]) 12/13/2010  . Atrial fibrillation ([place]) 08/03/2010  . CEREBROVASCULAR DISEASE 02/02/2008  . Hypothyroidism 12/31/2007    Resolved Hospital Problems  No resolved problems to display.    Discharge Condition: Stable  Diet recommendation: As tolerated  Vitals:   12/18/20 2020 12/19/20 0548  BP: 118/87 (!) 128/58  Pulse: 70 (!) 54  Resp: 16 20  Temp: 97.9 F (36.6 C) 97.7 F (36.5 C)  SpO2: 97% 96%    History of present illness:  87 y.o.femalewith medical history significant forchronic diastolic CHF EF 65 to 16%, CKD stage IV baseline creatinine 1.6-1.8,A. fib/a flutter on Pradaxa, Toprol, history of CVA, venous insufficiency, COPD/asthmatic bronchitis on oxygen as needed, hypothyroidism, OSA, severe depression on Wellbutrin Lexapro lithium and Zyprexa brought to the ED for evaluation of altered mental status difficulty with ambulation. Patient lives with herson who is disabled w/cerebral palsyand also has a caregiver at home. Patient has been having progressive change in mental status for past 1 to 2 weeks feeling unwell progressively declining with difficulty ambulation worsening confusion, poor oral intake,family also reporting she has been hallucinating seeing antsandspider. At baseline normally able to walk with a walker but now not able to stand without falling backwards. In the ED, VSS, CT head age-related changes, MRI no acute intracranial abnormality noted, CT spine DJD, chest x-ray evidence of  pneumonia, UA with negative nitrite and leuk esterase. Daughter reporting that they have unable to take care of her at home. Pt admitted for further management.    Today, patient denies any new complaints, appears very weak and deconditioned, alert/awake, denies any chest pain, shortness of breath, nausea/vomiting, fever/chills.  Plan to discharge to SNF for further rehab needs.  Discussed discharge plans with daughter on 12/18/2020    Hospital Course:  Active Problems:   Hypothyroidism   Atrial fibrillation (HCC)   CEREBROVASCULAR DISEASE   Chronic diastolic heart failure (HCC)   Permanent atrial fibrillation (HCC)   Subacute confusional state   Acute metabolic encephalopathy   Subacute metabolic encephalopathy/deconditioned/failure to thrive Noted to be deconditioned UA/UC no growth, BC x2 NGTD CT head and MRI brain with no acute finding No evidence of infection on chest x-ray S/P gentle IV hydration Vitamin B1 level pending, TSH WNL Vitamin B12 deficiency, level 213, continue supplements Continue thiamine supplement PT/OT at SNF  Chronic diastolic HF  Appears somewhat euvolemic, BNP 332 was previously 504 EF 65 to 70% on 12/17/2019 Repeat chest x-ray on 12/17/2020 showed mild atelectasis of bilateral lung bases, no focal pneumonia Continue to hold Lasix until oral food intake has improved (please restart at 20 mg daily and monitor)  CKD stage IV Stable Baseline creatinine 1.6-1.8 Continue to hold Lasix for now as mentioned above  A. fib/a flutter Rate controlled Continue Pradaxa and Toprol   History of CVA Repeat MRI brain unremarkable PT/OT  COPD/asthmatic bronchitis On oxygen as needed, continue Continue DuoNeb nightly  Hypothyroidism TSH stable Continue Synthroid.    OSA Not on CPAP Continue prn [place] o2  Severe depression Continue Wellbutrin, Lexapro, lithium and Zyprexa Lithium level normal 0.7  She is followed by psychiatry  Goals of  care Recently seen by [facility] DNR Supportive measures    Malnutrition Type:  Nutrition Problem: Inadequate oral intake Etiology: decreased appetite   Malnutrition Characteristics:  Signs/Symptoms: per patient/family report   Nutrition Interventions:  Interventions: Ensure Enlive (each supplement provides 350kcal and 20 grams of protein)   Estimated body mass index is 29.25 kg/m as calculated from the following:   Height as of this encounter: 5\' 7"  (1.702 m).   Weight as of this encounter: 84.7 kg.    Procedures:  None  Consultations:   None  Discharge Exam: BP (!) 128/58   Pulse (!) 54   Temp 97.7 F (36.5 C) (Oral)   Resp 20   Ht 5\' 7"  (1.702 m)   Wt 84.7 kg   SpO2 96%   BMI 29.25 kg/m     General: NAD, weak, deconditioned, awake/alert, oriented Cardiovascular: S1, S2 present  Respiratory: Diminished breath sounds bilaterally     Discharge Instructions You were cared for by a hospitalist during your hospital stay. If you have any questions about your discharge medications or the care you received while you were in the hospital after you are discharged, you can call the unit and asked to speak with the hospitalist on call if the hospitalist that took care of you is not available. Once you are discharged, your primary care physician will handle any further medical issues. Please note that NO REFILLS for any discharge medications will be authorized once you are discharged, as it is imperative that you return to your primary care physician (or establish a relationship with a primary care physician if you do not have one) for your aftercare needs so that they can reassess your need for medications and monitor your lab values.  Discharge Instructions    Diet - low sodium heart healthy   Complete by: As directed    Increase activity slowly   Complete by: As directed      Allergies as of 12/19/2020   No Known Allergies     Medication List    STOP  taking these medications   furosemide 20 MG tablet Commonly known as: LASIX   potassium chloride SA 20 MEQ tablet Commonly known as: KLOR-CON     TAKE these medications   acetaminophen 500 MG tablet Commonly known as: TYLENOL Take 1,000 mg by mouth every 6 (six) hours as needed for moderate pain.   buPROPion 150 MG 24 hr tablet Commonly known as: WELLBUTRIN XL Take 450 mg by mouth daily.   escitalopram 20 MG tablet Commonly known as: LEXAPRO Take 40 mg by mouth daily.   esomeprazole 20 MG capsule Commonly known as: NEXIUM Take 20 mg by mouth daily at 12 noon.   feeding supplement Liqd Take 237 mLs by mouth 2 (two) times daily between meals.   ipratropium-albuterol 0.5-2.5 (3) MG/3ML Soln Commonly known as: DUONEB Inhale 3 mLs into the lungs at bedtime.   lithium carbonate 300 MG CR tablet Commonly known as: LITHOBID Take 300 mg by mouth daily.   metoprolol tartrate 50 MG tablet Commonly known as: LOPRESSOR Take 25 mg by mouth 2 (two) times daily.   OLANZapine 5 MG tablet Commonly known as: ZYPREXA Take 5 mg by mouth at bedtime.   Pradaxa 75 MG Caps capsule Generic drug: dabigatran TAKE 1 CAPSULE BY MOUTH TWICE DAILY What changed: how much to take   simvastatin 20 MG tablet Commonly known as: ZOCOR Take 20 mg by mouth  daily.   Synthroid 112 MCG tablet Generic drug: levothyroxine Take 112 tablets by mouth daily before breakfast.   thiamine 100 MG tablet Take 1 tablet (100 mg total) by mouth daily. Start taking on: December 20, 2020      No Known Allergies  Follow-up Information    Shon Baton, MD. Schedule an appointment as soon as possible for a visit in 1 week(s).   Specialty: Internal Medicine Contact information: [place] [id number] [phone]                The results of significant diagnostics from this hospitalization (including imaging, microbiology, ancillary and laboratory) are listed below for reference.     Significant Diagnostic Studies: CT Head Wo Contrast  Result Date: 12/15/2020 CLINICAL DATA:  Altered mental status EXAM: CT HEAD WITHOUT CONTRAST CT CERVICAL SPINE WITHOUT CONTRAST TECHNIQUE: Multidetector CT imaging of the head and cervical spine was performed following the standard protocol without intravenous contrast. Multiplanar CT image reconstructions of the cervical spine were also generated. COMPARISON:  Head CT December 16, 2019. FINDINGS: CT HEAD FINDINGS Brain: No evidence of acute large vascular territory infarction, hemorrhage, hydrocephalus, extra-axial collection or mass lesion/mass effect. Similar mild burden chronic microvascular ischemic white matter disease. Mild age related global parenchymal volume loss with ex vacuo dilatation of ventricular system, stable. Vascular: No hyperdense vessel. Atherosclerotic calcifications of the intracranial portions of the internal carotid and vertebral arteries. Skull: Benign hyperostosis frontalis. Negative for fracture or focal lesion. Sinuses/Orbits: Paranasal sinuses are predominantly clear. Scattered opacification of the mastoid air cells. Orbits are unremarkable. Other: None CT CERVICAL SPINE FINDINGS Alignment: There is some reversal of the normal cervical lordosis. Grade 1 degenerative anterolisthesis of C4 on C5. Skull base and vertebrae: No acute fracture. No primary bone lesion or focal pathologic process. Soft tissues and spinal canal: No prevertebral fluid or swelling. No visible canal hematoma. Disc levels: Moderate multilevel degenerative changes spine with disc space narrowing osteophytosis and uncovertebral/facet hypertrophy. Upper chest: Evaluation degraded by respiratory motion, no overt abnormality. Other: None IMPRESSION: 1. No acute intracranial abnormality. 2. Similar mild age related global parenchymal volume loss and mild burden chronic microvascular ischemic white matter disease. 3. No evidence of acute fracture or traumatic  malalignment of the cervical spine. 4. Moderate multilevel degenerative changes of the cervical spine. Electronically Signed   By: Dahlia Bailiff MD   On: 12/15/2020 16:37   CT Cervical Spine Wo Contrast  Result Date: 12/15/2020 CLINICAL DATA:  Altered mental status EXAM: CT HEAD WITHOUT CONTRAST CT CERVICAL SPINE WITHOUT CONTRAST TECHNIQUE: Multidetector CT imaging of the head and cervical spine was performed following the standard protocol without intravenous contrast. Multiplanar CT image reconstructions of the cervical spine were also generated. COMPARISON:  Head CT December 16, 2019. FINDINGS: CT HEAD FINDINGS Brain: No evidence of acute large vascular territory infarction, hemorrhage, hydrocephalus, extra-axial collection or mass lesion/mass effect. Similar mild burden chronic microvascular ischemic white matter disease. Mild age related global parenchymal volume loss with ex vacuo dilatation of ventricular system, stable. Vascular: No hyperdense vessel. Atherosclerotic calcifications of the intracranial portions of the internal carotid and vertebral arteries. Skull: Benign hyperostosis frontalis. Negative for fracture or focal lesion. Sinuses/Orbits: Paranasal sinuses are predominantly clear. Scattered opacification of the mastoid air cells. Orbits are unremarkable. Other: None CT CERVICAL SPINE FINDINGS Alignment: There is some reversal of the normal cervical lordosis. Grade 1 degenerative anterolisthesis of C4 on C5. Skull base and vertebrae: No acute fracture. No primary bone  lesion or focal pathologic process. Soft tissues and spinal canal: No prevertebral fluid or swelling. No visible canal hematoma. Disc levels: Moderate multilevel degenerative changes spine with disc space narrowing osteophytosis and uncovertebral/facet hypertrophy. Upper chest: Evaluation degraded by respiratory motion, no overt abnormality. Other: None IMPRESSION: 1. No acute intracranial abnormality. 2. Similar mild age related  global parenchymal volume loss and mild burden chronic microvascular ischemic white matter disease. 3. No evidence of acute fracture or traumatic malalignment of the cervical spine. 4. Moderate multilevel degenerative changes of the cervical spine. Electronically Signed   By: Dahlia Bailiff MD   On: 12/15/2020 16:37   MR Brain Wo Contrast (neuro protocol)  Result Date: 12/16/2020 CLINICAL DATA:  Mental status changes of unknown cause. EXAM: MRI HEAD WITHOUT CONTRAST TECHNIQUE: Multiplanar, multiecho pulse sequences of the brain and surrounding structures were obtained without intravenous contrast. COMPARISON:  Head CT yesterday. FINDINGS: Brain: Diffusion imaging does not show any acute or subacute infarction or other cause of restricted diffusion. There are chronic small-vessel ischemic changes of the pons, worse on the right. No focal cerebellar insult. Cerebral hemispheres show an old infarction in the left thalamus, old small vessel infarctions of the basal ganglia and moderate to marked chronic small-vessel ischemic changes of the cerebral hemispheric white matter. No cortical or large vessel territory infarction. No mass lesion, hemorrhage, hydrocephalus or extra-axial collection. Vascular: Major vessels at the base of the brain show flow. Skull and upper cervical spine: Negative Sinuses/Orbits: Clear/normal Other: None IMPRESSION: No acute or reversible finding. Chronic small-vessel ischemic changes throughout the brain as outlined above. Electronically Signed   By: Nelson Chimes M.D.   On: 12/16/2020 10:17   DG Chest Port 1 View  Result Date: 12/17/2020 CLINICAL DATA:  Shortness of breath today EXAM: PORTABLE CHEST 1 VIEW COMPARISON:  December 15, 2020 FINDINGS: The heart size and mediastinal contours are stable. The heart size is mildly enlarged. Mild atelectasis of bilateral lung bases are identified. There is no focal infiltrate, pulmonary edema, or pleural effusion. The visualized skeletal structures  are stable. IMPRESSION: Mild atelectasis of bilateral lung bases.  No focal pneumonia. Electronically Signed   By: Abelardo Diesel M.D.   On: 12/17/2020 15:12   DG Chest Port 1 View  Result Date: 12/15/2020 CLINICAL DATA:  Altered mental status. EXAM: PORTABLE CHEST 1 VIEW COMPARISON:  December 16, 2019. FINDINGS: Stable cardiomegaly. No pneumothorax or pleural effusion is noted. Mild right upper lobe subsegmental atelectasis is noted. Left lung is clear. Bony thorax is unremarkable. IMPRESSION: Mild right upper lobe subsegmental atelectasis. Electronically Signed   By: Marijo Conception M.D.   On: 12/15/2020 16:45    Microbiology: Recent Results (from the past 240 hour(s))  Urine culture     Status: None   Collection Time: 12/15/20  3:55 PM   Specimen: Urine, Random  Result Value Ref Range Status   Specimen Description   Final    URINE, RANDOM Performed at [facility] [address], [place], [place] [address]    Special Requests   Final    NONE Performed at [facility], [place] [address] [place], [place] [address]    Culture   Final    NO GROWTH Performed at [facility], [place] [address], [place],  [address]    Report Status 12/17/2020 FINAL  Final  SARS CORONAVIRUS 2 (TAT 6-24 HRS) Nasopharyngeal Nasopharyngeal Swab     Status: None   Collection Time: 12/15/20  6:46 PM   Specimen: Nasopharyngeal Swab  Result Value Ref Range Status   SARS Coronavirus 2 NEGATIVE NEGATIVE Final    Comment: (NOTE) SARS-CoV-2 target nucleic acids are NOT DETECTED.  The SARS-CoV-2 RNA is generally detectable in upper and lower respiratory specimens during the acute phase of infection. Negative results do not preclude SARS-CoV-2 infection, do not rule out co-infections with other pathogens, and should not be used as the sole basis for treatment or other patient management decisions. Negative results must be combined with clinical observations, patient  history, and epidemiological information. The expected result is Negative.  Fact Sheet for Patients: SugarRoll.be  Fact Sheet for Healthcare Providers: https://www.woods-mathews.com/  This test is not yet approved or cleared by the [place] FDA and  has been authorized for detection and/or diagnosis of SARS-CoV-2 by FDA under an Emergency Use Authorization (EUA). This EUA will remain  in effect (meaning this test can be used) for the duration of the COVID-19 declaration under Se ction 564(b)(1) of the Act, 21 U.S.C. section 360bbb-3(b)(1), unless the authorization is terminated or revoked sooner.  Performed at [facility], [place] [address], [place], [place] [address]   SARS CORONAVIRUS 2 (TAT 6-24 HRS) Nasopharyngeal Nasopharyngeal Swab     Status: None   Collection Time: 12/18/20  4:18 PM   Specimen: Nasopharyngeal Swab  Result Value Ref Range Status   SARS Coronavirus 2 NEGATIVE NEGATIVE Final    Comment: (NOTE) SARS-CoV-2 target nucleic acids are NOT DETECTED.  The SARS-CoV-2 RNA is generally detectable in upper and lower respiratory specimens during the acute phase of infection. Negative results do not preclude SARS-CoV-2 infection, do not rule out co-infections with other pathogens, and should not be used as the sole basis for treatment or other patient management decisions. Negative results must be combined with clinical observations, patient history, and epidemiological information. The expected result is Negative.  Fact Sheet for Patients: SugarRoll.be  Fact Sheet for Healthcare Providers: https://www.woods-mathews.com/  This test is not yet approved or cleared by the [place] FDA and  has been authorized for detection and/or diagnosis of SARS-CoV-2 by FDA under an Emergency Use Authorization (EUA). This EUA will remain  in effect (meaning this test can be used) for the  duration of the COVID-19 declaration under Se ction 564(b)(1) of the Act, 21 U.S.C. section 360bbb-3(b)(1), unless the authorization is terminated or revoked sooner.  Performed at [facility], [place] [address] [place], [place] [address]      Labs: Basic Metabolic Panel: Recent Labs  Lab 12/15/20 1410 12/16/20 0547 12/17/20 0100 12/18/20 0736  NA 141 140 140 141  K 4.5 4.6 4.5 4.9  CL 105 104 106 108  CO2 28 28 24 27   GLUCOSE 97 91 93 89  BUN 26* 23 21 19   CREATININE 1.43* 1.44* 1.21* 1.42*  CALCIUM 9.0 9.0 9.0 9.1   Liver Function Tests: Recent Labs  Lab 12/15/20 1410 12/16/20 0547  AST 17 17  ALT 13 10  ALKPHOS 79 66  BILITOT 0.3 0.9  PROT 6.5 5.8*  ALBUMIN 3.5 3.1*   No results for input(s): LIPASE, AMYLASE in the last 168 hours. Recent Labs  Lab 12/15/20 2007  AMMONIA 21   CBC: Recent Labs  Lab 12/15/20 1410 12/16/20 0547 12/17/20 0100 12/18/20 0736  WBC 5.9 5.5 6.2 5.3  NEUTROABS 3.7  --   --  3.1  HGB 15.3* 14.5 14.1 13.4  HCT 50.3* 47.8* 46.5* 44.8  MCV 102.4* 104.1*  103.6* 104.4*  PLT 184 168 193 146*   Cardiac Enzymes: Recent Labs  Lab 12/15/20 1410  CKTOTAL 29*   BNP: BNP (last 3 results) Recent Labs    12/15/20 1410  BNP 332.9*    ProBNP (last 3 results) No results for input(s): PROBNP in the last 8760 hours.  CBG: No results for input(s): GLUCAP in the last 168 hours.     Signed:  Alma Friendly, MD [facility] 12/19/2020, 11:52 AM

## 2020-12-19 NOTE — TOC Transition Note (Signed)
Transition of Care [facility]) - CM/SW Discharge Note   Patient Details  Name: Sonya Kaufman MRN: [id number] Date of Birth: 1930/11/25  Transition of Care [facility]) CM/SW Contact:  Lynnell Catalan, RN Phone Number: 12/19/2020, 12:54 PM   Clinical Narrative:    Pt to dc to [facility] room 3239 today. Yellow DNR on chart for transport. [facility] contacted for transport. RN to call report to [phone].   Final next level of care: Skilled Nursing Facility Barriers to Discharge: No Barriers Identified   Patient Goals and CMS Choice Patient states their goals for this hospitalization and ongoing recovery are:: Go home CMS Medicare.gov Compare Post Acute Care list provided to:: Patient Choice offered to / list presented to : Adult Children,Patient  Discharge Placement              Patient chooses bed at: [facility] Patient to be transferred to facility by: [facility] Name of family member notified: Robin Patient and family notified of of transfer: 12/19/20  Discharge Plan and Services In-house Referral: NA Discharge Planning Services: NA Post Acute Care Choice: [facility]          DME Arranged: N/A DME Agency: NA       HH Arranged: NA HH Agency: NA        Social Determinants of Health (SDOH) Interventions     Readmission Risk Interventions No flowsheet data found.

## 2021-01-09 ENCOUNTER — Other Ambulatory Visit: Payer: Medicare Other | Admitting: Nurse Practitioner

## 2021-01-09 ENCOUNTER — Other Ambulatory Visit: Payer: Self-pay

## 2021-01-28 DEATH — deceased
